# Patient Record
Sex: Female | Born: 1960 | Race: White | Hispanic: No | Smoking: Former smoker
Health system: Southern US, Community
[De-identification: ages and names within clinical notes are randomized; demographics above are authoritative.]

## PROBLEM LIST (undated history)

## (undated) DIAGNOSIS — R0600 Dyspnea, unspecified: Secondary | ICD-10-CM

## (undated) DIAGNOSIS — I272 Pulmonary hypertension, unspecified: Secondary | ICD-10-CM

## (undated) DIAGNOSIS — R519 Headache, unspecified: Secondary | ICD-10-CM

## (undated) DIAGNOSIS — K219 Gastro-esophageal reflux disease without esophagitis: Secondary | ICD-10-CM

## (undated) DIAGNOSIS — R011 Cardiac murmur, unspecified: Secondary | ICD-10-CM

## (undated) DIAGNOSIS — K589 Irritable bowel syndrome without diarrhea: Secondary | ICD-10-CM

## (undated) DIAGNOSIS — C50419 Malignant neoplasm of upper-outer quadrant of unspecified female breast: Secondary | ICD-10-CM

## (undated) DIAGNOSIS — N809 Endometriosis, unspecified: Secondary | ICD-10-CM

## (undated) DIAGNOSIS — Z923 Personal history of irradiation: Secondary | ICD-10-CM

## (undated) DIAGNOSIS — Z9221 Personal history of antineoplastic chemotherapy: Secondary | ICD-10-CM

## (undated) DIAGNOSIS — G4730 Sleep apnea, unspecified: Secondary | ICD-10-CM

## (undated) DIAGNOSIS — C50919 Malignant neoplasm of unspecified site of unspecified female breast: Secondary | ICD-10-CM

## (undated) DIAGNOSIS — J189 Pneumonia, unspecified organism: Secondary | ICD-10-CM

## (undated) DIAGNOSIS — F419 Anxiety disorder, unspecified: Secondary | ICD-10-CM

## (undated) DIAGNOSIS — J45909 Unspecified asthma, uncomplicated: Secondary | ICD-10-CM

## (undated) DIAGNOSIS — M1990 Unspecified osteoarthritis, unspecified site: Secondary | ICD-10-CM

## (undated) HISTORY — DX: Pulmonary hypertension, unspecified: I27.20

## (undated) HISTORY — DX: Irritable bowel syndrome, unspecified: K58.9

## (undated) HISTORY — PX: BREAST ENHANCEMENT SURGERY: SHX7

## (undated) HISTORY — DX: Anxiety disorder, unspecified: F41.9

## (undated) HISTORY — DX: Gastro-esophageal reflux disease without esophagitis: K21.9

## (undated) HISTORY — DX: Malignant neoplasm of upper-outer quadrant of unspecified female breast: C50.419

## (undated) HISTORY — DX: Malignant neoplasm of unspecified site of unspecified female breast: C50.919

## (undated) HISTORY — DX: Endometriosis, unspecified: N80.9

## (undated) HISTORY — DX: Sleep apnea, unspecified: G47.30

---
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
## 1995-01-11 HISTORY — PX: NISSEN FUNDOPLICATION: SHX2091

## 1995-01-11 HISTORY — PX: HERNIA REPAIR: SHX51

## 1999-01-11 DIAGNOSIS — N809 Endometriosis, unspecified: Secondary | ICD-10-CM

## 1999-01-11 HISTORY — PX: EXPLORATORY LAPAROTOMY: SUR591

## 1999-01-11 HISTORY — DX: Endometriosis, unspecified: N80.9

## 1999-01-11 HISTORY — PX: OVARY SURGERY: SHX727

## 2001-10-08 ENCOUNTER — Other Ambulatory Visit: Admission: RE | Admit: 2001-10-08 | Discharge: 2001-10-08 | Payer: Self-pay | Admitting: Obstetrics and Gynecology

## 2001-10-10 ENCOUNTER — Encounter: Payer: Self-pay | Admitting: Obstetrics and Gynecology

## 2001-10-10 ENCOUNTER — Ambulatory Visit (HOSPITAL_COMMUNITY): Admission: RE | Admit: 2001-10-10 | Discharge: 2001-10-10 | Payer: Self-pay | Admitting: Obstetrics and Gynecology

## 2001-10-26 ENCOUNTER — Encounter: Payer: Self-pay | Admitting: Obstetrics and Gynecology

## 2001-10-26 ENCOUNTER — Encounter: Admission: RE | Admit: 2001-10-26 | Discharge: 2001-10-26 | Payer: Self-pay | Admitting: Obstetrics and Gynecology

## 2002-03-01 ENCOUNTER — Ambulatory Visit (HOSPITAL_COMMUNITY): Admission: RE | Admit: 2002-03-01 | Discharge: 2002-03-01 | Payer: Self-pay | Admitting: Obstetrics and Gynecology

## 2002-03-01 ENCOUNTER — Encounter: Payer: Self-pay | Admitting: Obstetrics and Gynecology

## 2002-10-11 ENCOUNTER — Other Ambulatory Visit: Admission: RE | Admit: 2002-10-11 | Discharge: 2002-10-11 | Payer: Self-pay | Admitting: Obstetrics and Gynecology

## 2003-11-19 ENCOUNTER — Ambulatory Visit: Payer: Self-pay | Admitting: Unknown Physician Specialty

## 2003-12-10 ENCOUNTER — Encounter: Admission: RE | Admit: 2003-12-10 | Discharge: 2003-12-10 | Payer: Self-pay | Admitting: Unknown Physician Specialty

## 2005-01-10 DIAGNOSIS — G4730 Sleep apnea, unspecified: Secondary | ICD-10-CM

## 2005-01-10 HISTORY — DX: Sleep apnea, unspecified: G47.30

## 2005-02-02 ENCOUNTER — Ambulatory Visit: Payer: Self-pay

## 2005-03-02 ENCOUNTER — Ambulatory Visit: Payer: Self-pay | Admitting: Unknown Physician Specialty

## 2005-03-15 ENCOUNTER — Ambulatory Visit: Payer: Self-pay | Admitting: Unknown Physician Specialty

## 2005-09-15 ENCOUNTER — Ambulatory Visit: Payer: Self-pay | Admitting: Family Medicine

## 2005-09-21 ENCOUNTER — Ambulatory Visit: Payer: Self-pay | Admitting: Unknown Physician Specialty

## 2006-01-11 ENCOUNTER — Encounter: Payer: Self-pay | Admitting: General Practice

## 2006-02-10 ENCOUNTER — Encounter: Payer: Self-pay | Admitting: General Practice

## 2006-03-03 ENCOUNTER — Encounter: Admission: RE | Admit: 2006-03-03 | Discharge: 2006-03-03 | Payer: Self-pay | Admitting: Unknown Physician Specialty

## 2006-03-08 ENCOUNTER — Ambulatory Visit: Payer: Self-pay | Admitting: Unknown Physician Specialty

## 2006-03-24 ENCOUNTER — Ambulatory Visit: Payer: Self-pay | Admitting: Unknown Physician Specialty

## 2006-07-02 ENCOUNTER — Other Ambulatory Visit: Payer: Self-pay

## 2006-07-02 ENCOUNTER — Emergency Department: Payer: Self-pay | Admitting: Unknown Physician Specialty

## 2006-08-04 ENCOUNTER — Ambulatory Visit: Payer: Self-pay | Admitting: Family Medicine

## 2006-11-22 ENCOUNTER — Ambulatory Visit: Payer: Self-pay | Admitting: Physician Assistant

## 2007-03-13 ENCOUNTER — Ambulatory Visit: Payer: Self-pay | Admitting: Unknown Physician Specialty

## 2008-03-14 ENCOUNTER — Ambulatory Visit: Payer: Self-pay | Admitting: Unknown Physician Specialty

## 2008-04-02 ENCOUNTER — Ambulatory Visit: Payer: Self-pay | Admitting: Unknown Physician Specialty

## 2008-07-09 ENCOUNTER — Ambulatory Visit: Payer: Self-pay | Admitting: Unknown Physician Specialty

## 2008-07-22 ENCOUNTER — Ambulatory Visit: Payer: Self-pay | Admitting: Unknown Physician Specialty

## 2008-09-03 ENCOUNTER — Ambulatory Visit: Payer: Self-pay | Admitting: Rheumatology

## 2008-09-06 ENCOUNTER — Ambulatory Visit: Payer: Self-pay | Admitting: Rheumatology

## 2009-01-10 HISTORY — PX: DILATION AND CURETTAGE OF UTERUS: SHX78

## 2009-04-28 ENCOUNTER — Ambulatory Visit: Payer: Self-pay | Admitting: Unknown Physician Specialty

## 2010-01-10 HISTORY — PX: COLONOSCOPY: SHX174

## 2010-05-05 ENCOUNTER — Ambulatory Visit: Payer: Self-pay | Admitting: Unknown Physician Specialty

## 2010-05-18 ENCOUNTER — Ambulatory Visit: Payer: Self-pay | Admitting: Unknown Physician Specialty

## 2010-08-09 DIAGNOSIS — M9979 Connective tissue and disc stenosis of intervertebral foramina of abdomen and other regions: Secondary | ICD-10-CM | POA: Insufficient documentation

## 2010-08-09 DIAGNOSIS — F339 Major depressive disorder, recurrent, unspecified: Secondary | ICD-10-CM | POA: Insufficient documentation

## 2010-09-19 ENCOUNTER — Ambulatory Visit: Payer: Self-pay | Admitting: Surgery

## 2010-10-13 ENCOUNTER — Other Ambulatory Visit (HOSPITAL_COMMUNITY): Payer: Self-pay | Admitting: Gastroenterology

## 2010-11-04 ENCOUNTER — Encounter (HOSPITAL_COMMUNITY)
Admission: RE | Admit: 2010-11-04 | Discharge: 2010-11-04 | Disposition: A | Discharge status: Home / Self Care | Payer: Federal, State, Local not specified - PPO | Source: Ambulatory Visit | Attending: Gastroenterology | Admitting: Gastroenterology

## 2010-11-04 DIAGNOSIS — R109 Unspecified abdominal pain: Secondary | ICD-10-CM | POA: Insufficient documentation

## 2010-11-04 MED ORDER — SINCALIDE 5 MCG IJ SOLR: 0.0200 ug/kg | Freq: Once | INTRAMUSCULAR | Status: DC

## 2010-11-04 MED ORDER — TECHNETIUM TC 99M MEBROFENIN IV KIT
5.4000 | PACK | Freq: Once | INTRAVENOUS | Status: AC | PRN
  Administered 2010-11-04: 5 via INTRAVENOUS

## 2011-01-11 DIAGNOSIS — I272 Pulmonary hypertension, unspecified: Secondary | ICD-10-CM

## 2011-01-11 HISTORY — DX: Pulmonary hypertension, unspecified: I27.20

## 2011-01-11 HISTORY — PX: RIGHT HEART CATH: SHX6075

## 2011-01-11 HISTORY — PX: BREAST BIOPSY: SHX20

## 2011-05-31 ENCOUNTER — Ambulatory Visit: Payer: Self-pay | Admitting: Family Medicine

## 2011-06-03 ENCOUNTER — Ambulatory Visit: Payer: Self-pay | Admitting: Family Medicine

## 2011-06-22 ENCOUNTER — Ambulatory Visit: Payer: Self-pay | Admitting: Family Medicine

## 2011-11-29 ENCOUNTER — Ambulatory Visit: Payer: Self-pay | Admitting: Cardiology

## 2011-12-05 ENCOUNTER — Ambulatory Visit: Payer: Self-pay | Admitting: General Surgery

## 2011-12-16 ENCOUNTER — Other Ambulatory Visit (HOSPITAL_COMMUNITY): Payer: Self-pay | Admitting: Cardiovascular Disease

## 2011-12-16 DIAGNOSIS — R0602 Shortness of breath: Secondary | ICD-10-CM

## 2011-12-28 ENCOUNTER — Ambulatory Visit (HOSPITAL_COMMUNITY): Payer: Federal, State, Local not specified - PPO | Attending: Specialist

## 2011-12-28 DIAGNOSIS — R0602 Shortness of breath: Secondary | ICD-10-CM

## 2012-01-11 DIAGNOSIS — C50919 Malignant neoplasm of unspecified site of unspecified female breast: Secondary | ICD-10-CM

## 2012-01-11 HISTORY — PX: PORT A CATH REVISION: SHX6033

## 2012-01-11 HISTORY — DX: Malignant neoplasm of unspecified site of unspecified female breast: C50.919

## 2012-01-11 HISTORY — PX: BREAST BIOPSY: SHX20

## 2012-01-11 HISTORY — PX: REDUCTION MAMMAPLASTY: SUR839

## 2012-01-11 HISTORY — PX: BREAST LUMPECTOMY: SHX2

## 2012-01-25 ENCOUNTER — Ambulatory Visit: Payer: Self-pay | Admitting: General Surgery

## 2012-01-30 ENCOUNTER — Ambulatory Visit: Payer: Self-pay | Admitting: General Surgery

## 2012-01-30 DIAGNOSIS — C50419 Malignant neoplasm of upper-outer quadrant of unspecified female breast: Secondary | ICD-10-CM

## 2012-01-30 HISTORY — DX: Malignant neoplasm of upper-outer quadrant of unspecified female breast: C50.419

## 2012-01-30 HISTORY — PX: BREAST SURGERY: SHX581

## 2012-02-01 LAB — PATHOLOGY REPORT

## 2012-02-09 ENCOUNTER — Ambulatory Visit: Payer: Self-pay

## 2012-02-09 HISTORY — PX: BREAST SURGERY: SHX581

## 2012-02-29 ENCOUNTER — Ambulatory Visit: Payer: Self-pay | Admitting: Radiation Oncology

## 2012-02-29 ENCOUNTER — Ambulatory Visit: Payer: Self-pay | Admitting: Internal Medicine

## 2012-03-10 ENCOUNTER — Ambulatory Visit: Payer: Self-pay | Admitting: Radiation Oncology

## 2012-03-10 ENCOUNTER — Ambulatory Visit: Payer: Self-pay | Admitting: Internal Medicine

## 2012-03-13 ENCOUNTER — Ambulatory Visit: Payer: Self-pay | Admitting: General Surgery

## 2012-03-14 LAB — COMPREHENSIVE METABOLIC PANEL
Albumin: 3.8 g/dL (ref 3.4–5.0)
Alkaline Phosphatase: 115 U/L (ref 50–136)
Anion Gap: 7 (ref 7–16)
BUN: 17 mg/dL (ref 7–18)
Bilirubin,Total: 0.4 mg/dL (ref 0.2–1.0)
Calcium, Total: 8.8 mg/dL (ref 8.5–10.1)
Chloride: 106 mmol/L (ref 98–107)
Co2: 26 mmol/L (ref 21–32)
Creatinine: 0.63 mg/dL (ref 0.60–1.30)
Glucose: 92 mg/dL (ref 65–99)
Osmolality: 279 (ref 275–301)
Potassium: 4 mmol/L (ref 3.5–5.1)
SGOT(AST): 30 U/L (ref 15–37)
SGPT (ALT): 36 U/L (ref 12–78)
Sodium: 139 mmol/L (ref 136–145)
Total Protein: 6.9 g/dL (ref 6.4–8.2)

## 2012-03-14 LAB — CBC CANCER CENTER
Basophil #: 0 x10 3/mm (ref 0.0–0.1)
Basophil %: 0.4 %
Eosinophil #: 0.1 x10 3/mm (ref 0.0–0.7)
Eosinophil %: 1.4 %
HCT: 38.4 % (ref 35.0–47.0)
HGB: 12.8 g/dL (ref 12.0–16.0)
Lymphocyte #: 2.5 x10 3/mm (ref 1.0–3.6)
Lymphocyte %: 40.2 %
MCH: 30.8 pg (ref 26.0–34.0)
MCHC: 33.3 g/dL (ref 32.0–36.0)
MCV: 92 fL (ref 80–100)
Monocyte #: 0.5 x10 3/mm (ref 0.2–0.9)
Monocyte %: 7.3 %
Neutrophil #: 3.2 x10 3/mm (ref 1.4–6.5)
Neutrophil %: 50.7 %
Platelet: 290 x10 3/mm (ref 150–440)
RBC: 4.15 10*6/uL (ref 3.80–5.20)
RDW: 12.3 % (ref 11.5–14.5)
WBC: 6.3 x10 3/mm (ref 3.6–11.0)

## 2012-03-15 LAB — CANCER ANTIGEN 27.29: CA 27.29: 7.4 U/mL (ref 0.0–38.6)

## 2012-03-21 LAB — CBC CANCER CENTER
Basophil #: 0 x10 3/mm (ref 0.0–0.1)
Basophil %: 2.1 %
Eosinophil #: 0.1 x10 3/mm (ref 0.0–0.7)
Eosinophil %: 11.6 %
HCT: 38 % (ref 35.0–47.0)
HGB: 12.5 g/dL (ref 12.0–16.0)
Lymphocyte #: 0.9 x10 3/mm — ABNORMAL LOW (ref 1.0–3.6)
Lymphocyte %: 76 %
MCH: 30.3 pg (ref 26.0–34.0)
MCHC: 33 g/dL (ref 32.0–36.0)
MCV: 92 fL (ref 80–100)
Monocyte #: 0.1 x10 3/mm — ABNORMAL LOW (ref 0.2–0.9)
Monocyte %: 7.2 %
Neutrophil #: 0 x10 3/mm — ABNORMAL LOW (ref 1.4–6.5)
Neutrophil %: 3.1 %
Platelet: 178 x10 3/mm (ref 150–440)
RBC: 4.13 10*6/uL (ref 3.80–5.20)
RDW: 12.2 % (ref 11.5–14.5)
WBC: 1.2 x10 3/mm — CL (ref 3.6–11.0)

## 2012-03-28 LAB — BASIC METABOLIC PANEL
Anion Gap: 7 (ref 7–16)
BUN: 19 mg/dL — ABNORMAL HIGH (ref 7–18)
Calcium, Total: 8.9 mg/dL (ref 8.5–10.1)
Chloride: 104 mmol/L (ref 98–107)
Co2: 28 mmol/L (ref 21–32)
Creatinine: 0.87 mg/dL (ref 0.60–1.30)
EGFR (African American): >60
EGFR (Non-African Amer.): >60
Glucose: 96 mg/dL (ref 65–99)
Osmolality: 280 (ref 275–301)
Potassium: 4.3 mmol/L (ref 3.5–5.1)
Sodium: 139 mmol/L (ref 136–145)

## 2012-03-28 LAB — CBC CANCER CENTER
Basophil #: 0 x10 3/mm (ref 0.0–0.1)
Basophil %: 0.4 %
Eosinophil #: 0.1 x10 3/mm (ref 0.0–0.7)
Eosinophil %: 0.6 %
HCT: 38.9 % (ref 35.0–47.0)
HGB: 13.1 g/dL (ref 12.0–16.0)
Lymphocyte #: 2.3 x10 3/mm (ref 1.0–3.6)
Lymphocyte %: 22 %
MCH: 31.1 pg (ref 26.0–34.0)
MCHC: 33.8 g/dL (ref 32.0–36.0)
MCV: 92 fL (ref 80–100)
Monocyte #: 0.9 x10 3/mm (ref 0.2–0.9)
Monocyte %: 8.4 %
Neutrophil #: 7.3 x10 3/mm — ABNORMAL HIGH (ref 1.4–6.5)
Neutrophil %: 68.6 %
Platelet: 209 x10 3/mm (ref 150–440)
RBC: 4.23 10*6/uL (ref 3.80–5.20)
RDW: 12.4 % (ref 11.5–14.5)
WBC: 10.6 x10 3/mm (ref 3.6–11.0)

## 2012-03-28 LAB — HEPATIC FUNCTION PANEL A (ARMC)
Albumin: 4.1 g/dL (ref 3.4–5.0)
Alkaline Phosphatase: 133 U/L (ref 50–136)
Bilirubin, Direct: <0.05 mg/dL (ref 0.00–0.20)
Bilirubin,Total: 0.2 mg/dL (ref 0.2–1.0)
SGOT(AST): 18 U/L (ref 15–37)
SGPT (ALT): 38 U/L (ref 12–78)
Total Protein: 7.1 g/dL (ref 6.4–8.2)

## 2012-04-04 LAB — CBC CANCER CENTER
Basophil #: 0 x10 3/mm (ref 0.0–0.1)
Basophil %: 1.1 %
Eosinophil #: 0 x10 3/mm (ref 0.0–0.7)
Eosinophil %: 0.2 %
HCT: 36.9 % (ref 35.0–47.0)
HGB: 12.3 g/dL (ref 12.0–16.0)
Lymphocyte #: 1.7 x10 3/mm (ref 1.0–3.6)
Lymphocyte %: 36.1 %
MCH: 30.7 pg (ref 26.0–34.0)
MCHC: 33.3 g/dL (ref 32.0–36.0)
MCV: 92 fL (ref 80–100)
Monocyte #: 0.5 x10 3/mm (ref 0.2–0.9)
Monocyte %: 11.2 %
Neutrophil #: 2.4 x10 3/mm (ref 1.4–6.5)
Neutrophil %: 51.4 %
Platelet: 358 x10 3/mm (ref 150–440)
RBC: 4 10*6/uL (ref 3.80–5.20)
RDW: 12.4 % (ref 11.5–14.5)
WBC: 4.6 x10 3/mm (ref 3.6–11.0)

## 2012-04-10 ENCOUNTER — Ambulatory Visit: Payer: Self-pay | Admitting: Radiation Oncology

## 2012-04-10 ENCOUNTER — Ambulatory Visit: Payer: Self-pay | Admitting: Internal Medicine

## 2012-04-11 LAB — CBC CANCER CENTER
Basophil #: 0 x10 3/mm (ref 0.0–0.1)
Basophil %: 3.2 %
Eosinophil #: 0 x10 3/mm (ref 0.0–0.7)
Eosinophil %: 0.8 %
HCT: 32.1 % — ABNORMAL LOW (ref 35.0–47.0)
HGB: 10.6 g/dL — ABNORMAL LOW (ref 12.0–16.0)
Lymphocyte #: 0.8 x10 3/mm — ABNORMAL LOW (ref 1.0–3.6)
Lymphocyte %: 63.4 %
MCH: 30.3 pg (ref 26.0–34.0)
MCHC: 33.1 g/dL (ref 32.0–36.0)
MCV: 91 fL (ref 80–100)
Monocyte #: 0.1 x10 3/mm — ABNORMAL LOW (ref 0.2–0.9)
Monocyte %: 8 %
Neutrophil #: 0.3 x10 3/mm — ABNORMAL LOW (ref 1.4–6.5)
Neutrophil %: 24.6 %
Platelet: 225 x10 3/mm (ref 150–440)
RBC: 3.51 10*6/uL — ABNORMAL LOW (ref 3.80–5.20)
RDW: 12.6 % (ref 11.5–14.5)
WBC: 1.3 x10 3/mm — CL (ref 3.6–11.0)

## 2012-04-18 LAB — HEPATIC FUNCTION PANEL A (ARMC)
Albumin: 3.7 g/dL (ref 3.4–5.0)
Alkaline Phosphatase: 142 U/L — ABNORMAL HIGH (ref 50–136)
Bilirubin, Direct: <0.05 mg/dL (ref 0.00–0.20)
Bilirubin,Total: 0.2 mg/dL (ref 0.2–1.0)
SGOT(AST): 17 U/L (ref 15–37)
SGPT (ALT): 41 U/L (ref 12–78)
Total Protein: 6.7 g/dL (ref 6.4–8.2)

## 2012-04-18 LAB — CBC CANCER CENTER
Basophil #: 0.1 x10 3/mm (ref 0.0–0.1)
Basophil %: 0.8 %
Eosinophil #: 0 x10 3/mm (ref 0.0–0.7)
Eosinophil %: 0.2 %
HCT: 33.3 % — ABNORMAL LOW (ref 35.0–47.0)
HGB: 10.8 g/dL — ABNORMAL LOW (ref 12.0–16.0)
Lymphocyte #: 2.2 x10 3/mm (ref 1.0–3.6)
Lymphocyte %: 19.2 %
MCH: 29.8 pg (ref 26.0–34.0)
MCHC: 32.4 g/dL (ref 32.0–36.0)
MCV: 92 fL (ref 80–100)
Monocyte #: 0.8 x10 3/mm (ref 0.2–0.9)
Monocyte %: 7.1 %
Neutrophil #: 8.3 x10 3/mm — ABNORMAL HIGH (ref 1.4–6.5)
Neutrophil %: 72.7 %
Platelet: 176 x10 3/mm (ref 150–440)
RBC: 3.63 10*6/uL — ABNORMAL LOW (ref 3.80–5.20)
RDW: 12.8 % (ref 11.5–14.5)
WBC: 11.5 x10 3/mm — ABNORMAL HIGH (ref 3.6–11.0)

## 2012-04-18 LAB — BASIC METABOLIC PANEL
Anion Gap: 7 (ref 7–16)
BUN: 20 mg/dL — ABNORMAL HIGH (ref 7–18)
Calcium, Total: 9 mg/dL (ref 8.5–10.1)
Chloride: 107 mmol/L (ref 98–107)
Co2: 28 mmol/L (ref 21–32)
Creatinine: 0.88 mg/dL (ref 0.60–1.30)
EGFR (African American): >60
EGFR (Non-African Amer.): >60
Glucose: 102 mg/dL — ABNORMAL HIGH (ref 65–99)
Osmolality: 286 (ref 275–301)
Potassium: 4.3 mmol/L (ref 3.5–5.1)
Sodium: 142 mmol/L (ref 136–145)

## 2012-04-25 LAB — CBC CANCER CENTER
Basophil #: 0 x10 3/mm (ref 0.0–0.1)
Basophil %: 0.8 %
Eosinophil #: 0 x10 3/mm (ref 0.0–0.7)
Eosinophil %: 0.5 %
HCT: 32.6 % — ABNORMAL LOW (ref 35.0–47.0)
HGB: 11.1 g/dL — ABNORMAL LOW (ref 12.0–16.0)
Lymphocyte #: 1.2 x10 3/mm (ref 1.0–3.6)
Lymphocyte %: 26.7 %
MCH: 31 pg (ref 26.0–34.0)
MCHC: 34.1 g/dL (ref 32.0–36.0)
MCV: 91 fL (ref 80–100)
Monocyte #: 0.6 x10 3/mm (ref 0.2–0.9)
Monocyte %: 13.5 %
Neutrophil #: 2.7 x10 3/mm (ref 1.4–6.5)
Neutrophil %: 58.5 %
Platelet: 340 x10 3/mm (ref 150–440)
RBC: 3.58 10*6/uL — ABNORMAL LOW (ref 3.80–5.20)
RDW: 13.4 % (ref 11.5–14.5)
WBC: 4.5 x10 3/mm (ref 3.6–11.0)

## 2012-05-02 LAB — CBC CANCER CENTER
Basophil #: 0 x10 3/mm (ref 0.0–0.1)
Basophil %: 1.4 %
Eosinophil #: 0 x10 3/mm (ref 0.0–0.7)
Eosinophil %: 0.4 %
HCT: 31.7 % — ABNORMAL LOW (ref 35.0–47.0)
HGB: 10.6 g/dL — ABNORMAL LOW (ref 12.0–16.0)
Lymphocyte #: 0.6 x10 3/mm — ABNORMAL LOW (ref 1.0–3.6)
Lymphocyte %: 26.2 %
MCH: 30.8 pg (ref 26.0–34.0)
MCHC: 33.4 g/dL (ref 32.0–36.0)
MCV: 92 fL (ref 80–100)
Monocyte #: 0.1 x10 3/mm — ABNORMAL LOW (ref 0.2–0.9)
Monocyte %: 5.9 %
Neutrophil #: 1.4 x10 3/mm (ref 1.4–6.5)
Neutrophil %: 66.1 %
Platelet: 174 x10 3/mm (ref 150–440)
RBC: 3.43 10*6/uL — ABNORMAL LOW (ref 3.80–5.20)
RDW: 14.2 % (ref 11.5–14.5)
WBC: 2.1 x10 3/mm — ABNORMAL LOW (ref 3.6–11.0)

## 2012-05-09 LAB — CBC CANCER CENTER
Basophil #: 0 x10 3/mm (ref 0.0–0.1)
Basophil %: 0.5 %
Eosinophil #: 0 x10 3/mm (ref 0.0–0.7)
Eosinophil %: 0.4 %
HCT: 30.8 % — ABNORMAL LOW (ref 35.0–47.0)
HGB: 10.6 g/dL — ABNORMAL LOW (ref 12.0–16.0)
Lymphocyte #: 1.4 x10 3/mm (ref 1.0–3.6)
Lymphocyte %: 14.4 %
MCH: 31.6 pg (ref 26.0–34.0)
MCHC: 34.4 g/dL (ref 32.0–36.0)
MCV: 92 fL (ref 80–100)
Monocyte #: 0.9 x10 3/mm (ref 0.2–0.9)
Monocyte %: 9 %
Neutrophil #: 7.2 x10 3/mm — ABNORMAL HIGH (ref 1.4–6.5)
Neutrophil %: 75.7 %
Platelet: 270 x10 3/mm (ref 150–440)
RBC: 3.36 10*6/uL — ABNORMAL LOW (ref 3.80–5.20)
RDW: 15 % — ABNORMAL HIGH (ref 11.5–14.5)
WBC: 9.5 x10 3/mm (ref 3.6–11.0)

## 2012-05-09 LAB — BASIC METABOLIC PANEL
Anion Gap: 6 — ABNORMAL LOW (ref 7–16)
BUN: 14 mg/dL (ref 7–18)
Calcium, Total: 8.8 mg/dL (ref 8.5–10.1)
Chloride: 109 mmol/L — ABNORMAL HIGH (ref 98–107)
Co2: 26 mmol/L (ref 21–32)
Creatinine: 0.57 mg/dL — ABNORMAL LOW (ref 0.60–1.30)
EGFR (African American): >60
EGFR (Non-African Amer.): >60
Glucose: 91 mg/dL (ref 65–99)
Osmolality: 281 (ref 275–301)
Potassium: 3.8 mmol/L (ref 3.5–5.1)
Sodium: 141 mmol/L (ref 136–145)

## 2012-05-09 LAB — HEPATIC FUNCTION PANEL A (ARMC)
Albumin: 4 g/dL (ref 3.4–5.0)
Alkaline Phosphatase: 135 U/L (ref 50–136)
Bilirubin, Direct: <0.1 mg/dL (ref 0.00–0.20)
Bilirubin,Total: 0.3 mg/dL (ref 0.2–1.0)
SGOT(AST): 22 U/L (ref 15–37)
SGPT (ALT): 34 U/L (ref 12–78)
Total Protein: 6.7 g/dL (ref 6.4–8.2)

## 2012-05-10 ENCOUNTER — Ambulatory Visit: Payer: Self-pay | Admitting: Internal Medicine

## 2012-05-10 ENCOUNTER — Ambulatory Visit: Payer: Self-pay | Admitting: Radiation Oncology

## 2012-05-16 LAB — CBC CANCER CENTER
Basophil #: 0 x10 3/mm (ref 0.0–0.1)
Basophil %: 1 %
Eosinophil #: 0 x10 3/mm (ref 0.0–0.7)
Eosinophil %: 0.5 %
HCT: 34.4 % — ABNORMAL LOW (ref 35.0–47.0)
HGB: 11.8 g/dL — ABNORMAL LOW (ref 12.0–16.0)
Lymphocyte #: 1.1 x10 3/mm (ref 1.0–3.6)
Lymphocyte %: 27.7 %
MCH: 31.6 pg (ref 26.0–34.0)
MCHC: 34.2 g/dL (ref 32.0–36.0)
MCV: 93 fL (ref 80–100)
Monocyte #: 0.6 x10 3/mm (ref 0.2–0.9)
Monocyte %: 15.8 %
Neutrophil #: 2.1 x10 3/mm (ref 1.4–6.5)
Neutrophil %: 55 %
Platelet: 350 x10 3/mm (ref 150–440)
RBC: 3.72 10*6/uL — ABNORMAL LOW (ref 3.80–5.20)
RDW: 15.9 % — ABNORMAL HIGH (ref 11.5–14.5)
WBC: 3.8 x10 3/mm (ref 3.6–11.0)

## 2012-05-23 LAB — BASIC METABOLIC PANEL
Anion Gap: 9 (ref 7–16)
BUN: 20 mg/dL — ABNORMAL HIGH (ref 7–18)
Calcium, Total: 9.1 mg/dL (ref 8.5–10.1)
Chloride: 105 mmol/L (ref 98–107)
Co2: 26 mmol/L (ref 21–32)
Creatinine: 0.7 mg/dL (ref 0.60–1.30)
EGFR (African American): >60
EGFR (Non-African Amer.): >60
Glucose: 94 mg/dL (ref 65–99)
Osmolality: 282 (ref 275–301)
Potassium: 4.1 mmol/L (ref 3.5–5.1)
Sodium: 140 mmol/L (ref 136–145)

## 2012-05-23 LAB — CBC CANCER CENTER
Basophil #: 0 x10 3/mm (ref 0.0–0.1)
Basophil %: 1.3 %
Eosinophil #: 0 x10 3/mm (ref 0.0–0.7)
Eosinophil %: 0.4 %
HCT: 33.5 % — ABNORMAL LOW (ref 35.0–47.0)
HGB: 11.3 g/dL — ABNORMAL LOW (ref 12.0–16.0)
Lymphocyte #: 1.3 x10 3/mm (ref 1.0–3.6)
Lymphocyte %: 36.3 %
MCH: 31.7 pg (ref 26.0–34.0)
MCHC: 33.8 g/dL (ref 32.0–36.0)
MCV: 94 fL (ref 80–100)
Monocyte #: 0.5 x10 3/mm (ref 0.2–0.9)
Monocyte %: 13.4 %
Neutrophil #: 1.7 x10 3/mm (ref 1.4–6.5)
Neutrophil %: 48.6 %
Platelet: 267 x10 3/mm (ref 150–440)
RBC: 3.57 10*6/uL — ABNORMAL LOW (ref 3.80–5.20)
RDW: 15.7 % — ABNORMAL HIGH (ref 11.5–14.5)
WBC: 3.6 x10 3/mm (ref 3.6–11.0)

## 2012-05-25 ENCOUNTER — Ambulatory Visit: Payer: Self-pay | Admitting: Cardiovascular Disease

## 2012-05-29 ENCOUNTER — Ambulatory Visit: Payer: Self-pay | Admitting: Cardiology

## 2012-05-30 LAB — CBC CANCER CENTER
Basophil #: 0.1 x10 3/mm (ref 0.0–0.1)
Basophil %: 0.9 %
Eosinophil #: 0.1 x10 3/mm (ref 0.0–0.7)
Eosinophil %: 1.2 %
HCT: 32.9 % — ABNORMAL LOW (ref 35.0–47.0)
HGB: 11.1 g/dL — ABNORMAL LOW (ref 12.0–16.0)
Lymphocyte #: 1.6 x10 3/mm (ref 1.0–3.6)
Lymphocyte %: 28 %
MCH: 31.9 pg (ref 26.0–34.0)
MCHC: 33.9 g/dL (ref 32.0–36.0)
MCV: 94 fL (ref 80–100)
Monocyte #: 0.7 x10 3/mm (ref 0.2–0.9)
Monocyte %: 12 %
Neutrophil #: 3.2 x10 3/mm (ref 1.4–6.5)
Neutrophil %: 57.9 %
Platelet: 267 x10 3/mm (ref 150–440)
RBC: 3.49 10*6/uL — ABNORMAL LOW (ref 3.80–5.20)
RDW: 15.9 % — ABNORMAL HIGH (ref 11.5–14.5)
WBC: 5.6 x10 3/mm (ref 3.6–11.0)

## 2012-06-06 LAB — CBC CANCER CENTER
Basophil #: 0 x10 3/mm (ref 0.0–0.1)
Basophil %: 1.5 %
Eosinophil #: 0.1 x10 3/mm (ref 0.0–0.7)
Eosinophil %: 9.1 %
HCT: 31.8 % — ABNORMAL LOW (ref 35.0–47.0)
HGB: 10.5 g/dL — ABNORMAL LOW (ref 12.0–16.0)
Lymphocyte #: 0.6 x10 3/mm — ABNORMAL LOW (ref 1.0–3.6)
Lymphocyte %: 64.4 %
MCH: 31.5 pg (ref 26.0–34.0)
MCHC: 33 g/dL (ref 32.0–36.0)
MCV: 96 fL (ref 80–100)
Monocyte #: 0.1 x10 3/mm — ABNORMAL LOW (ref 0.2–0.9)
Monocyte %: 6.2 %
Neutrophil #: 0.2 x10 3/mm — ABNORMAL LOW (ref 1.4–6.5)
Neutrophil %: 18.8 %
Platelet: 119 x10 3/mm — ABNORMAL LOW (ref 150–440)
RBC: 3.33 10*6/uL — ABNORMAL LOW (ref 3.80–5.20)
RDW: 14.7 % — ABNORMAL HIGH (ref 11.5–14.5)
WBC: 0.9 x10 3/mm — CL (ref 3.6–11.0)

## 2012-06-10 ENCOUNTER — Ambulatory Visit: Payer: Self-pay | Admitting: Internal Medicine

## 2012-06-10 ENCOUNTER — Ambulatory Visit: Payer: Self-pay | Admitting: Radiation Oncology

## 2012-06-13 ENCOUNTER — Ambulatory Visit: Payer: Self-pay | Admitting: General Surgery

## 2012-06-13 LAB — CBC CANCER CENTER
Basophil #: 0 x10 3/mm (ref 0.0–0.1)
Basophil %: 0.7 %
Eosinophil #: 0.1 x10 3/mm (ref 0.0–0.7)
Eosinophil %: 2.6 %
HCT: 30.8 % — ABNORMAL LOW (ref 35.0–47.0)
HGB: 10.7 g/dL — ABNORMAL LOW (ref 12.0–16.0)
Lymphocyte #: 1.3 x10 3/mm (ref 1.0–3.6)
Lymphocyte %: 22.7 %
MCH: 32.7 pg (ref 26.0–34.0)
MCHC: 34.6 g/dL (ref 32.0–36.0)
MCV: 95 fL (ref 80–100)
Monocyte #: 0.6 x10 3/mm (ref 0.2–0.9)
Monocyte %: 11 %
Neutrophil #: 3.6 x10 3/mm (ref 1.4–6.5)
Neutrophil %: 63 %
Platelet: 347 x10 3/mm (ref 150–440)
RBC: 3.26 10*6/uL — ABNORMAL LOW (ref 3.80–5.20)
RDW: 15.1 % — ABNORMAL HIGH (ref 11.5–14.5)
WBC: 5.7 x10 3/mm (ref 3.6–11.0)

## 2012-06-13 LAB — HEPATIC FUNCTION PANEL A (ARMC)
Albumin: 3.8 g/dL (ref 3.4–5.0)
Alkaline Phosphatase: 145 U/L — ABNORMAL HIGH (ref 50–136)
Bilirubin, Direct: <0.05 mg/dL (ref 0.00–0.20)
Bilirubin,Total: 0.2 mg/dL (ref 0.2–1.0)
SGOT(AST): 33 U/L (ref 15–37)
SGPT (ALT): 38 U/L (ref 12–78)
Total Protein: 6.8 g/dL (ref 6.4–8.2)

## 2012-06-13 LAB — BASIC METABOLIC PANEL
Anion Gap: 4 — ABNORMAL LOW (ref 7–16)
BUN: 11 mg/dL (ref 7–18)
Calcium, Total: 9.1 mg/dL (ref 8.5–10.1)
Chloride: 109 mmol/L — ABNORMAL HIGH (ref 98–107)
Co2: 27 mmol/L (ref 21–32)
Creatinine: 0.48 mg/dL — ABNORMAL LOW (ref 0.60–1.30)
EGFR (African American): >60
EGFR (Non-African Amer.): >60
Glucose: 90 mg/dL (ref 65–99)
Osmolality: 278 (ref 275–301)
Potassium: 3.8 mmol/L (ref 3.5–5.1)
Sodium: 140 mmol/L (ref 136–145)

## 2012-06-19 ENCOUNTER — Ambulatory Visit: Payer: Self-pay | Admitting: General Surgery

## 2012-06-19 ENCOUNTER — Encounter: Payer: Self-pay | Admitting: General Surgery

## 2012-06-20 LAB — CBC CANCER CENTER
Basophil #: 0 x10 3/mm (ref 0.0–0.1)
Basophil %: 0.2 %
Eosinophil #: 0 x10 3/mm (ref 0.0–0.7)
Eosinophil %: 0 %
HCT: 35.3 % (ref 35.0–47.0)
HGB: 12.4 g/dL (ref 12.0–16.0)
Lymphocyte #: 0.4 x10 3/mm — ABNORMAL LOW (ref 1.0–3.6)
Lymphocyte %: 11.9 %
MCH: 33.5 pg (ref 26.0–34.0)
MCHC: 35.1 g/dL (ref 32.0–36.0)
MCV: 96 fL (ref 80–100)
Monocyte #: 0 x10 3/mm — ABNORMAL LOW (ref 0.2–0.9)
Monocyte %: 1.2 %
Neutrophil #: 3.2 x10 3/mm (ref 1.4–6.5)
Neutrophil %: 86.7 %
Platelet: 403 x10 3/mm (ref 150–440)
RBC: 3.69 10*6/uL — ABNORMAL LOW (ref 3.80–5.20)
RDW: 15 % — ABNORMAL HIGH (ref 11.5–14.5)
WBC: 3.7 x10 3/mm (ref 3.6–11.0)

## 2012-06-27 ENCOUNTER — Encounter: Payer: Self-pay | Admitting: General Surgery

## 2012-06-27 ENCOUNTER — Ambulatory Visit (INDEPENDENT_AMBULATORY_CARE_PROVIDER_SITE_OTHER): Payer: Federal, State, Local not specified - PPO | Admitting: General Surgery

## 2012-06-27 VITALS — BP 110/78 | HR 78 | Resp 12 | Ht 62.0 in | Wt 191.0 lb

## 2012-06-27 DIAGNOSIS — C50912 Malignant neoplasm of unspecified site of left female breast: Secondary | ICD-10-CM

## 2012-06-27 DIAGNOSIS — C50919 Malignant neoplasm of unspecified site of unspecified female breast: Secondary | ICD-10-CM

## 2012-06-27 DIAGNOSIS — Z853 Personal history of malignant neoplasm of breast: Secondary | ICD-10-CM

## 2012-06-27 LAB — CBC CANCER CENTER
Basophil #: 0 x10 3/mm (ref 0.0–0.1)
Basophil %: 0.8 %
Eosinophil #: 0 x10 3/mm (ref 0.0–0.7)
Eosinophil %: 0 %
HCT: 32.8 % — ABNORMAL LOW (ref 35.0–47.0)
HGB: 11.6 g/dL — ABNORMAL LOW (ref 12.0–16.0)
Lymphocyte #: 0.5 x10 3/mm — ABNORMAL LOW (ref 1.0–3.6)
Lymphocyte %: 12.5 %
MCH: 33.5 pg (ref 26.0–34.0)
MCHC: 35.2 g/dL (ref 32.0–36.0)
MCV: 95 fL (ref 80–100)
Monocyte #: 0.1 x10 3/mm — ABNORMAL LOW (ref 0.2–0.9)
Monocyte %: 2 %
Neutrophil #: 3.5 x10 3/mm (ref 1.4–6.5)
Neutrophil %: 84.7 %
Platelet: 295 x10 3/mm (ref 150–440)
RBC: 3.45 10*6/uL — ABNORMAL LOW (ref 3.80–5.20)
RDW: 14.4 % (ref 11.5–14.5)
WBC: 4.1 x10 3/mm (ref 3.6–11.0)

## 2012-06-27 NOTE — Patient Instructions (Signed)
Patient to return in for December 2014

## 2012-06-27 NOTE — Progress Notes (Signed)
Patient ID: Katrina Holland, female   DOB: 1960/11/19, 52 y.o.   MRN: [id number]  No chief complaint on file.   HPI Katrina Holland is a 52 y.o. female Who presents for a breast evaluation. The most recent mammogram was done on 06/19/12 cat 2 Patient does perform regular self breast checks and gets regular mammograms done. No family history of breast cancer. Patient had an left breast lumpectomy in 2014.    HPI  Past Medical History  Diagnosis Date  . Breast mass   . Anxiety   . GERD (gastroesophageal reflux disease)   . Pulmonary hypertension 2013  . Irritable bowel syndrome   . Endometriosis 2001  . Sleep apnea 2007  . Cancer 2014    breast     Past Surgical History  Procedure Laterality Date  . Breast surgery Left 2014  . Breast enhancement surgery  2014  . Port a cath revision  2014  . Dilation and curettage of uterus  2011  . Ovary surgery Right 2001  . Nissen fundoplication  1997  . Tonsillectomy  1969  . Hernia repair  1997  . Colonoscopy  2012  . Right heart cath  2013  . Exploratory laparotomy  2001    Family History  Problem Relation Age of Onset  . Colon cancer Paternal Grandfather     Social History History  Substance Use Topics  . Smoking status: Former Smoker -- 5.00 packs/day for 5 years  . Smokeless tobacco: Never Used  . Alcohol Use: No    No Known Allergies  Current Outpatient Prescriptions  Medication Sig Dispense Refill  . diclofenac (VOLTAREN) 75 MG EC tablet Take 1 tablet by mouth 2 (two) times daily.      . sertraline (ZOLOFT) 100 MG tablet Take 100 mg by mouth daily.       No current facility-administered medications for this visit.    Review of Systems Review of Systems  Constitutional: Negative.   Respiratory: Negative.   Cardiovascular: Negative.     Blood pressure 110/78, pulse 78, resp. rate 12, height 5\' 2"  (1.575 m), weight 191 lb (86.637 kg).  Physical Exam Physical Exam  Constitutional: She appears well-developed and  well-nourished.  Cardiovascular: Normal rate, regular rhythm and normal heart sounds.   Pulmonary/Chest: Breath sounds normal. Right breast exhibits no inverted nipple, no mass, no nipple discharge, no skin change and no tenderness. Left breast exhibits no inverted nipple, no mass, no nipple discharge, no skin change and no tenderness.  Left breast well heal upper quadrant incision and well healed incision at 6 o'clock Power port is doing well.  Lymphadenopathy:    She has no cervical adenopathy.  Neurological: She is alert.  Skin: Skin is warm and dry.    Data Reviewed Bilateral mammograms dated 06/13/2012 was reviewed. Previously identified left breast mass is no longer appreciated. BI-RAD-2.  Assessment    Stage I, T1c carcinoma of the left breast.    Plan    The patient has just completed her a.AC therapy and is beginning Taxol. She will be continuing on Herceptin for one year. Whole breast radiation will be initiated after the Taxol therapy is completed. We'll plan for followup examination with a left breast mammogram in 6 months.       Earline Mayotte 06/28/2012, 5:56 PM

## 2012-06-28 ENCOUNTER — Encounter: Payer: Self-pay | Admitting: General Surgery

## 2012-06-28 DIAGNOSIS — C50919 Malignant neoplasm of unspecified site of unspecified female breast: Secondary | ICD-10-CM | POA: Insufficient documentation

## 2012-07-04 LAB — CBC CANCER CENTER
Basophil #: 0 x10 3/mm (ref 0.0–0.1)
Basophil %: 1.1 %
Eosinophil #: 0 x10 3/mm (ref 0.0–0.7)
Eosinophil %: 0.4 %
HCT: 30.8 % — ABNORMAL LOW (ref 35.0–47.0)
HGB: 10.9 g/dL — ABNORMAL LOW (ref 12.0–16.0)
Lymphocyte #: 1.2 x10 3/mm (ref 1.0–3.6)
Lymphocyte %: 29.2 %
MCH: 34 pg (ref 26.0–34.0)
MCHC: 35.4 g/dL (ref 32.0–36.0)
MCV: 96 fL (ref 80–100)
Monocyte #: 0.4 x10 3/mm (ref 0.2–0.9)
Monocyte %: 11.2 %
Neutrophil #: 2.3 x10 3/mm (ref 1.4–6.5)
Neutrophil %: 58.1 %
Platelet: 254 x10 3/mm (ref 150–440)
RBC: 3.21 10*6/uL — ABNORMAL LOW (ref 3.80–5.20)
RDW: 13.9 % (ref 11.5–14.5)
WBC: 4 x10 3/mm (ref 3.6–11.0)

## 2012-07-04 LAB — HEPATIC FUNCTION PANEL A (ARMC)
Albumin: 3.5 g/dL (ref 3.4–5.0)
Alkaline Phosphatase: 118 U/L (ref 50–136)
Bilirubin, Direct: 0.1 mg/dL (ref 0.00–0.20)
Bilirubin,Total: 0.3 mg/dL (ref 0.2–1.0)
SGOT(AST): 20 U/L (ref 15–37)
SGPT (ALT): 43 U/L (ref 12–78)
Total Protein: 6.6 g/dL (ref 6.4–8.2)

## 2012-07-04 LAB — CREATININE, SERUM
Creatinine: 0.81 mg/dL (ref 0.60–1.30)
EGFR (African American): >60
EGFR (Non-African Amer.): >60

## 2012-07-10 ENCOUNTER — Ambulatory Visit: Payer: Self-pay | Admitting: Internal Medicine

## 2012-07-10 ENCOUNTER — Ambulatory Visit: Payer: Self-pay | Admitting: Radiation Oncology

## 2012-07-18 LAB — CBC CANCER CENTER
Basophil #: 0 x10 3/mm (ref 0.0–0.1)
Basophil %: 0.9 %
Eosinophil #: 0.1 x10 3/mm (ref 0.0–0.7)
Eosinophil %: 1.8 %
HCT: 30.9 % — ABNORMAL LOW (ref 35.0–47.0)
HGB: 11 g/dL — ABNORMAL LOW (ref 12.0–16.0)
Lymphocyte #: 1.2 x10 3/mm (ref 1.0–3.6)
Lymphocyte %: 26.7 %
MCH: 34.3 pg — ABNORMAL HIGH (ref 26.0–34.0)
MCHC: 35.8 g/dL (ref 32.0–36.0)
MCV: 96 fL (ref 80–100)
Monocyte #: 0.4 x10 3/mm (ref 0.2–0.9)
Monocyte %: 8.4 %
Neutrophil #: 2.8 x10 3/mm (ref 1.4–6.5)
Neutrophil %: 62.2 %
Platelet: 291 x10 3/mm (ref 150–440)
RBC: 3.22 10*6/uL — ABNORMAL LOW (ref 3.80–5.20)
RDW: 13.5 % (ref 11.5–14.5)
WBC: 4.5 x10 3/mm (ref 3.6–11.0)

## 2012-07-18 LAB — COMPREHENSIVE METABOLIC PANEL
Albumin: 3.7 g/dL (ref 3.4–5.0)
Alkaline Phosphatase: 128 U/L (ref 50–136)
Anion Gap: 7 (ref 7–16)
BUN: 16 mg/dL (ref 7–18)
Bilirubin,Total: 0.4 mg/dL (ref 0.2–1.0)
Calcium, Total: 9.5 mg/dL (ref 8.5–10.1)
Chloride: 106 mmol/L (ref 98–107)
Co2: 29 mmol/L (ref 21–32)
Creatinine: 0.73 mg/dL (ref 0.60–1.30)
EGFR (African American): >60
EGFR (Non-African Amer.): >60
Glucose: 93 mg/dL (ref 65–99)
Osmolality: 284 (ref 275–301)
Potassium: 3.9 mmol/L (ref 3.5–5.1)
SGOT(AST): 25 U/L (ref 15–37)
SGPT (ALT): 46 U/L (ref 12–78)
Sodium: 142 mmol/L (ref 136–145)
Total Protein: 6.7 g/dL (ref 6.4–8.2)

## 2012-07-18 LAB — HEPATIC FUNCTION PANEL A (ARMC): Bilirubin, Direct: <0.05 mg/dL (ref 0.00–0.20)

## 2012-07-25 LAB — CBC CANCER CENTER
Basophil #: 0 x10 3/mm (ref 0.0–0.1)
Basophil %: 1.1 %
Eosinophil #: 0.1 x10 3/mm (ref 0.0–0.7)
Eosinophil %: 2.1 %
HCT: 32.1 % — ABNORMAL LOW (ref 35.0–47.0)
HGB: 11.1 g/dL — ABNORMAL LOW (ref 12.0–16.0)
Lymphocyte #: 1.6 x10 3/mm (ref 1.0–3.6)
Lymphocyte %: 35.2 %
MCH: 33.2 pg (ref 26.0–34.0)
MCHC: 34.7 g/dL (ref 32.0–36.0)
MCV: 96 fL (ref 80–100)
Monocyte #: 0.4 x10 3/mm (ref 0.2–0.9)
Monocyte %: 8.7 %
Neutrophil #: 2.4 x10 3/mm (ref 1.4–6.5)
Neutrophil %: 52.9 %
Platelet: 296 x10 3/mm (ref 150–440)
RBC: 3.36 10*6/uL — ABNORMAL LOW (ref 3.80–5.20)
RDW: 13.4 % (ref 11.5–14.5)
WBC: 4.5 x10 3/mm (ref 3.6–11.0)

## 2012-08-01 LAB — CBC CANCER CENTER
Basophil #: 0 x10 3/mm (ref 0.0–0.1)
Basophil %: 0 %
Eosinophil #: 0 x10 3/mm (ref 0.0–0.7)
Eosinophil %: 0 %
HCT: 33 % — ABNORMAL LOW (ref 35.0–47.0)
HGB: 11.6 g/dL — ABNORMAL LOW (ref 12.0–16.0)
Lymphocyte #: 0.8 x10 3/mm — ABNORMAL LOW (ref 1.0–3.6)
Lymphocyte %: 13.3 %
MCH: 33.5 pg (ref 26.0–34.0)
MCHC: 35.3 g/dL (ref 32.0–36.0)
MCV: 95 fL (ref 80–100)
Monocyte #: 0.1 x10 3/mm — ABNORMAL LOW (ref 0.2–0.9)
Monocyte %: 1.6 %
Neutrophil #: 4.9 x10 3/mm (ref 1.4–6.5)
Neutrophil %: 85.1 %
Platelet: 300 x10 3/mm (ref 150–440)
RBC: 3.47 10*6/uL — ABNORMAL LOW (ref 3.80–5.20)
RDW: 13.4 % (ref 11.5–14.5)
WBC: 5.7 x10 3/mm (ref 3.6–11.0)

## 2012-08-08 LAB — CBC CANCER CENTER
Basophil #: 0 x10 3/mm (ref 0.0–0.1)
Basophil %: 0.2 %
Eosinophil #: 0 x10 3/mm (ref 0.0–0.7)
Eosinophil %: 0 %
HCT: 33.9 % — ABNORMAL LOW (ref 35.0–47.0)
HGB: 12 g/dL (ref 12.0–16.0)
Lymphocyte #: 0.7 x10 3/mm — ABNORMAL LOW (ref 1.0–3.6)
Lymphocyte %: 14.2 %
MCH: 33.8 pg (ref 26.0–34.0)
MCHC: 35.5 g/dL (ref 32.0–36.0)
MCV: 95 fL (ref 80–100)
Monocyte #: 0 x10 3/mm — ABNORMAL LOW (ref 0.2–0.9)
Monocyte %: 1 %
Neutrophil #: 4.2 x10 3/mm (ref 1.4–6.5)
Neutrophil %: 84.6 %
Platelet: 306 x10 3/mm (ref 150–440)
RBC: 3.56 10*6/uL — ABNORMAL LOW (ref 3.80–5.20)
RDW: 13.2 % (ref 11.5–14.5)
WBC: 5 x10 3/mm (ref 3.6–11.0)

## 2012-08-08 LAB — HEPATIC FUNCTION PANEL A (ARMC)
Albumin: 4 g/dL (ref 3.4–5.0)
Alkaline Phosphatase: 124 U/L (ref 50–136)
Bilirubin, Direct: <0.05 mg/dL (ref 0.00–0.20)
Bilirubin,Total: 0.3 mg/dL (ref 0.2–1.0)
SGOT(AST): 27 U/L (ref 15–37)
SGPT (ALT): 54 U/L (ref 12–78)
Total Protein: 7.1 g/dL (ref 6.4–8.2)

## 2012-08-08 LAB — CREATININE, SERUM
Creatinine: 0.77 mg/dL (ref 0.60–1.30)
EGFR (African American): >60
EGFR (Non-African Amer.): >60

## 2012-08-10 ENCOUNTER — Ambulatory Visit: Payer: Self-pay | Admitting: Radiation Oncology

## 2012-08-10 ENCOUNTER — Ambulatory Visit: Payer: Self-pay | Admitting: Internal Medicine

## 2012-08-15 LAB — BASIC METABOLIC PANEL
Anion Gap: 10 (ref 7–16)
BUN: 14 mg/dL (ref 7–18)
Calcium, Total: 9.3 mg/dL (ref 8.5–10.1)
Chloride: 102 mmol/L (ref 98–107)
Co2: 24 mmol/L (ref 21–32)
Creatinine: 0.86 mg/dL (ref 0.60–1.30)
EGFR (African American): >60
EGFR (Non-African Amer.): >60
Glucose: 170 mg/dL — ABNORMAL HIGH (ref 65–99)
Osmolality: 276 (ref 275–301)
Potassium: 3.9 mmol/L (ref 3.5–5.1)
Sodium: 136 mmol/L (ref 136–145)

## 2012-08-15 LAB — CBC CANCER CENTER
Basophil #: 0 x10 3/mm (ref 0.0–0.1)
Basophil %: 0.1 %
Eosinophil #: 0 x10 3/mm (ref 0.0–0.7)
Eosinophil %: 0 %
HCT: 33.8 % — ABNORMAL LOW (ref 35.0–47.0)
HGB: 12.1 g/dL (ref 12.0–16.0)
Lymphocyte #: 0.8 x10 3/mm — ABNORMAL LOW (ref 1.0–3.6)
Lymphocyte %: 19.3 %
MCH: 33.7 pg (ref 26.0–34.0)
MCHC: 35.7 g/dL (ref 32.0–36.0)
MCV: 94 fL (ref 80–100)
Monocyte #: 0 x10 3/mm — ABNORMAL LOW (ref 0.2–0.9)
Monocyte %: 1.1 %
Neutrophil #: 3.1 x10 3/mm (ref 1.4–6.5)
Neutrophil %: 79.5 %
Platelet: 281 x10 3/mm (ref 150–440)
RBC: 3.58 10*6/uL — ABNORMAL LOW (ref 3.80–5.20)
RDW: 13.3 % (ref 11.5–14.5)
WBC: 3.9 x10 3/mm (ref 3.6–11.0)

## 2012-08-22 LAB — CBC CANCER CENTER
Basophil #: 0.1 x10 3/mm (ref 0.0–0.1)
Basophil %: 0.5 %
Eosinophil #: 0 x10 3/mm (ref 0.0–0.7)
Eosinophil %: 0 %
HCT: 34.9 % — ABNORMAL LOW (ref 35.0–47.0)
HGB: 12.1 g/dL (ref 12.0–16.0)
Lymphocyte #: 1.1 x10 3/mm (ref 1.0–3.6)
Lymphocyte %: 10.4 %
MCH: 32.9 pg (ref 26.0–34.0)
MCHC: 34.7 g/dL (ref 32.0–36.0)
MCV: 95 fL (ref 80–100)
Monocyte #: 0.1 x10 3/mm — ABNORMAL LOW (ref 0.2–0.9)
Monocyte %: 0.8 %
Neutrophil #: 9.4 x10 3/mm — ABNORMAL HIGH (ref 1.4–6.5)
Neutrophil %: 88.3 %
Platelet: 266 x10 3/mm (ref 150–440)
RBC: 3.69 10*6/uL — ABNORMAL LOW (ref 3.80–5.20)
RDW: 13.1 % (ref 11.5–14.5)
WBC: 10.6 x10 3/mm (ref 3.6–11.0)

## 2012-08-22 LAB — BASIC METABOLIC PANEL
Anion Gap: 11 (ref 7–16)
BUN: 17 mg/dL (ref 7–18)
Calcium, Total: 9.6 mg/dL (ref 8.5–10.1)
Chloride: 99 mmol/L (ref 98–107)
Co2: 27 mmol/L (ref 21–32)
Creatinine: 0.87 mg/dL (ref 0.60–1.30)
EGFR (African American): >60
EGFR (Non-African Amer.): >60
Glucose: 143 mg/dL — ABNORMAL HIGH (ref 65–99)
Osmolality: 278 (ref 275–301)
Potassium: 4 mmol/L (ref 3.5–5.1)
Sodium: 137 mmol/L (ref 136–145)

## 2012-08-29 LAB — CBC CANCER CENTER
Basophil #: 0 x10 3/mm (ref 0.0–0.1)
Basophil %: 0.1 %
Eosinophil #: 0 x10 3/mm (ref 0.0–0.7)
Eosinophil %: 0.1 %
HCT: 31.9 % — ABNORMAL LOW (ref 35.0–47.0)
HGB: 11.3 g/dL — ABNORMAL LOW (ref 12.0–16.0)
Lymphocyte #: 1.6 x10 3/mm (ref 1.0–3.6)
Lymphocyte %: 16.3 %
MCH: 33.2 pg (ref 26.0–34.0)
MCHC: 35.3 g/dL (ref 32.0–36.0)
MCV: 94 fL (ref 80–100)
Monocyte #: 0.3 x10 3/mm (ref 0.2–0.9)
Monocyte %: 3.5 %
Neutrophil #: 7.7 x10 3/mm — ABNORMAL HIGH (ref 1.4–6.5)
Neutrophil %: 80 %
Platelet: 255 x10 3/mm (ref 150–440)
RBC: 3.39 10*6/uL — ABNORMAL LOW (ref 3.80–5.20)
RDW: 13.2 % (ref 11.5–14.5)
WBC: 9.6 x10 3/mm (ref 3.6–11.0)

## 2012-09-05 LAB — CBC CANCER CENTER
Basophil #: 0 x10 3/mm (ref 0.0–0.1)
Basophil %: 0.8 %
Eosinophil #: 0.1 x10 3/mm (ref 0.0–0.7)
Eosinophil %: 2.2 %
HCT: 34.8 % — ABNORMAL LOW (ref 35.0–47.0)
HGB: 12 g/dL (ref 12.0–16.0)
Lymphocyte #: 1.5 x10 3/mm (ref 1.0–3.6)
Lymphocyte %: 36.9 %
MCH: 32.6 pg (ref 26.0–34.0)
MCHC: 34.5 g/dL (ref 32.0–36.0)
MCV: 95 fL (ref 80–100)
Monocyte #: 0.2 x10 3/mm (ref 0.2–0.9)
Monocyte %: 6.2 %
Neutrophil #: 2.1 x10 3/mm (ref 1.4–6.5)
Neutrophil %: 53.9 %
Platelet: 318 x10 3/mm (ref 150–440)
RBC: 3.68 10*6/uL — ABNORMAL LOW (ref 3.80–5.20)
RDW: 13.5 % (ref 11.5–14.5)
WBC: 4 x10 3/mm (ref 3.6–11.0)

## 2012-09-10 ENCOUNTER — Ambulatory Visit: Payer: Self-pay | Admitting: Internal Medicine

## 2012-09-10 ENCOUNTER — Ambulatory Visit: Payer: Self-pay | Admitting: Radiation Oncology

## 2012-09-12 LAB — CBC CANCER CENTER
Basophil #: 0 x10 3/mm (ref 0.0–0.1)
Basophil %: 1.1 %
Eosinophil #: 0.1 x10 3/mm (ref 0.0–0.7)
Eosinophil %: 2 %
HCT: 34.1 % — ABNORMAL LOW (ref 35.0–47.0)
HGB: 11.7 g/dL — ABNORMAL LOW (ref 12.0–16.0)
Lymphocyte #: 1.5 x10 3/mm (ref 1.0–3.6)
Lymphocyte %: 47.2 %
MCH: 32.3 pg (ref 26.0–34.0)
MCHC: 34.3 g/dL (ref 32.0–36.0)
MCV: 94 fL (ref 80–100)
Monocyte #: 0.3 x10 3/mm (ref 0.2–0.9)
Monocyte %: 8.8 %
Neutrophil #: 1.3 x10 3/mm — ABNORMAL LOW (ref 1.4–6.5)
Neutrophil %: 40.9 %
Platelet: 320 x10 3/mm (ref 150–440)
RBC: 3.62 10*6/uL — ABNORMAL LOW (ref 3.80–5.20)
RDW: 14 % (ref 11.5–14.5)
WBC: 3.1 x10 3/mm — ABNORMAL LOW (ref 3.6–11.0)

## 2012-09-12 LAB — HEPATIC FUNCTION PANEL A (ARMC)
Albumin: 3.7 g/dL (ref 3.4–5.0)
Alkaline Phosphatase: 129 U/L (ref 50–136)
Bilirubin, Direct: 0.1 mg/dL (ref 0.00–0.20)
Bilirubin,Total: 0.2 mg/dL (ref 0.2–1.0)
SGOT(AST): 22 U/L (ref 15–37)
SGPT (ALT): 39 U/L (ref 12–78)
Total Protein: 6.6 g/dL (ref 6.4–8.2)

## 2012-09-12 LAB — CREATININE, SERUM
Creatinine: 0.82 mg/dL (ref 0.60–1.30)
EGFR (African American): >60
EGFR (Non-African Amer.): >60

## 2012-09-19 LAB — CBC CANCER CENTER
Basophil #: 0 x10 3/mm (ref 0.0–0.1)
Basophil %: 0.8 %
Eosinophil #: 0 x10 3/mm (ref 0.0–0.7)
Eosinophil %: 0.9 %
HCT: 35 % (ref 35.0–47.0)
HGB: 12.1 g/dL (ref 12.0–16.0)
Lymphocyte #: 1.7 x10 3/mm (ref 1.0–3.6)
Lymphocyte %: 35.9 %
MCH: 32.7 pg (ref 26.0–34.0)
MCHC: 34.7 g/dL (ref 32.0–36.0)
MCV: 94 fL (ref 80–100)
Monocyte #: 0.3 x10 3/mm (ref 0.2–0.9)
Monocyte %: 7 %
Neutrophil #: 2.7 x10 3/mm (ref 1.4–6.5)
Neutrophil %: 55.4 %
Platelet: 294 x10 3/mm (ref 150–440)
RBC: 3.71 10*6/uL — ABNORMAL LOW (ref 3.80–5.20)
RDW: 13.7 % (ref 11.5–14.5)
WBC: 4.8 x10 3/mm (ref 3.6–11.0)

## 2012-09-26 LAB — BASIC METABOLIC PANEL
Anion Gap: 9 (ref 7–16)
BUN: 19 mg/dL — ABNORMAL HIGH (ref 7–18)
Calcium, Total: 9.8 mg/dL (ref 8.5–10.1)
Chloride: 104 mmol/L (ref 98–107)
Co2: 27 mmol/L (ref 21–32)
Creatinine: 0.76 mg/dL (ref 0.60–1.30)
EGFR (African American): >60
EGFR (Non-African Amer.): >60
Glucose: 114 mg/dL — ABNORMAL HIGH (ref 65–99)
Osmolality: 283 (ref 275–301)
Potassium: 3.7 mmol/L (ref 3.5–5.1)
Sodium: 140 mmol/L (ref 136–145)

## 2012-09-26 LAB — CBC CANCER CENTER
Basophil #: 0 x10 3/mm (ref 0.0–0.1)
Basophil %: 0.8 %
Eosinophil #: 0 x10 3/mm (ref 0.0–0.7)
Eosinophil %: 0.7 %
HCT: 32.2 % — ABNORMAL LOW (ref 35.0–47.0)
HGB: 11.2 g/dL — ABNORMAL LOW (ref 12.0–16.0)
Lymphocyte #: 1.9 x10 3/mm (ref 1.0–3.6)
Lymphocyte %: 34.5 %
MCH: 33.1 pg (ref 26.0–34.0)
MCHC: 34.8 g/dL (ref 32.0–36.0)
MCV: 95 fL (ref 80–100)
Monocyte #: 0.4 x10 3/mm (ref 0.2–0.9)
Monocyte %: 7.1 %
Neutrophil #: 3.1 x10 3/mm (ref 1.4–6.5)
Neutrophil %: 56.9 %
Platelet: 322 x10 3/mm (ref 150–440)
RBC: 3.39 10*6/uL — ABNORMAL LOW (ref 3.80–5.20)
RDW: 14.6 % — ABNORMAL HIGH (ref 11.5–14.5)
WBC: 5.4 x10 3/mm (ref 3.6–11.0)

## 2012-09-26 LAB — HEPATIC FUNCTION PANEL A (ARMC)
Albumin: 3.8 g/dL (ref 3.4–5.0)
Alkaline Phosphatase: 119 U/L (ref 50–136)
Bilirubin, Direct: 0.1 mg/dL (ref 0.00–0.20)
Bilirubin,Total: 0.4 mg/dL (ref 0.2–1.0)
SGOT(AST): 26 U/L (ref 15–37)
SGPT (ALT): 45 U/L (ref 12–78)
Total Protein: 6.7 g/dL (ref 6.4–8.2)

## 2012-10-10 ENCOUNTER — Ambulatory Visit: Payer: Self-pay | Admitting: Radiation Oncology

## 2012-10-10 ENCOUNTER — Ambulatory Visit: Payer: Self-pay | Admitting: Internal Medicine

## 2012-10-17 LAB — CBC CANCER CENTER
Basophil #: 0 x10 3/mm (ref 0.0–0.1)
Basophil %: 0.6 %
Eosinophil #: 0.1 x10 3/mm (ref 0.0–0.7)
Eosinophil %: 1.6 %
HCT: 33.6 % — ABNORMAL LOW (ref 35.0–47.0)
HGB: 11.5 g/dL — ABNORMAL LOW (ref 12.0–16.0)
Lymphocyte #: 1.7 x10 3/mm (ref 1.0–3.6)
Lymphocyte %: 30 %
MCH: 32.6 pg (ref 26.0–34.0)
MCHC: 34.1 g/dL (ref 32.0–36.0)
MCV: 95 fL (ref 80–100)
Monocyte #: 0.6 x10 3/mm (ref 0.2–0.9)
Monocyte %: 10.4 %
Neutrophil #: 3.2 x10 3/mm (ref 1.4–6.5)
Neutrophil %: 57.4 %
Platelet: 248 x10 3/mm (ref 150–440)
RBC: 3.52 10*6/uL — ABNORMAL LOW (ref 3.80–5.20)
RDW: 13.7 % (ref 11.5–14.5)
WBC: 5.5 x10 3/mm (ref 3.6–11.0)

## 2012-10-17 LAB — CREATININE, SERUM
Creatinine: 0.72 mg/dL (ref 0.60–1.30)
EGFR (African American): >60
EGFR (Non-African Amer.): >60

## 2012-11-01 LAB — CBC CANCER CENTER
Basophil #: 0 x10 3/mm (ref 0.0–0.1)
Basophil %: 0.5 %
Eosinophil #: 0.1 x10 3/mm (ref 0.0–0.7)
Eosinophil %: 2.1 %
HCT: 35.4 % (ref 35.0–47.0)
HGB: 12.2 g/dL (ref 12.0–16.0)
Lymphocyte #: 1.5 x10 3/mm (ref 1.0–3.6)
Lymphocyte %: 28.4 %
MCH: 32.8 pg (ref 26.0–34.0)
MCHC: 34.6 g/dL (ref 32.0–36.0)
MCV: 95 fL (ref 80–100)
Monocyte #: 0.5 x10 3/mm (ref 0.2–0.9)
Monocyte %: 10.2 %
Neutrophil #: 3.1 x10 3/mm (ref 1.4–6.5)
Neutrophil %: 58.8 %
Platelet: 271 x10 3/mm (ref 150–440)
RBC: 3.73 10*6/uL — ABNORMAL LOW (ref 3.80–5.20)
RDW: 13.4 % (ref 11.5–14.5)
WBC: 5.3 x10 3/mm (ref 3.6–11.0)

## 2012-11-10 ENCOUNTER — Ambulatory Visit: Payer: Self-pay | Admitting: Radiation Oncology

## 2012-11-10 ENCOUNTER — Ambulatory Visit: Payer: Self-pay | Admitting: Internal Medicine

## 2012-11-15 LAB — CBC CANCER CENTER
Basophil #: 0 x10 3/mm (ref 0.0–0.1)
Basophil %: 0.6 %
Eosinophil #: 0.1 x10 3/mm (ref 0.0–0.7)
Eosinophil %: 1.7 %
HCT: 33.9 % — ABNORMAL LOW (ref 35.0–47.0)
HGB: 12 g/dL (ref 12.0–16.0)
Lymphocyte #: 1.5 x10 3/mm (ref 1.0–3.6)
Lymphocyte %: 25.7 %
MCH: 32.8 pg (ref 26.0–34.0)
MCHC: 35.3 g/dL (ref 32.0–36.0)
MCV: 93 fL (ref 80–100)
Monocyte #: 0.5 x10 3/mm (ref 0.2–0.9)
Monocyte %: 9.4 %
Neutrophil #: 3.7 x10 3/mm (ref 1.4–6.5)
Neutrophil %: 62.6 %
Platelet: 259 x10 3/mm (ref 150–440)
RBC: 3.65 10*6/uL — ABNORMAL LOW (ref 3.80–5.20)
RDW: 12.9 % (ref 11.5–14.5)
WBC: 5.8 x10 3/mm (ref 3.6–11.0)

## 2012-11-22 LAB — CBC CANCER CENTER
Basophil #: 0 x10 3/mm (ref 0.0–0.1)
Basophil %: 0.6 %
Eosinophil #: 0.1 x10 3/mm (ref 0.0–0.7)
Eosinophil %: 2.6 %
HCT: 35.3 % (ref 35.0–47.0)
HGB: 11.8 g/dL — ABNORMAL LOW (ref 12.0–16.0)
Lymphocyte #: 1.6 x10 3/mm (ref 1.0–3.6)
Lymphocyte %: 30.4 %
MCH: 31.4 pg (ref 26.0–34.0)
MCHC: 33.5 g/dL (ref 32.0–36.0)
MCV: 94 fL (ref 80–100)
Monocyte #: 0.6 x10 3/mm (ref 0.2–0.9)
Monocyte %: 11.6 %
Neutrophil #: 3 x10 3/mm (ref 1.4–6.5)
Neutrophil %: 54.8 %
Platelet: 272 x10 3/mm (ref 150–440)
RBC: 3.77 10*6/uL — ABNORMAL LOW (ref 3.80–5.20)
RDW: 13 % (ref 11.5–14.5)
WBC: 5.4 x10 3/mm (ref 3.6–11.0)

## 2012-12-10 ENCOUNTER — Ambulatory Visit: Payer: Self-pay | Admitting: Radiation Oncology

## 2012-12-10 ENCOUNTER — Ambulatory Visit: Payer: Self-pay | Admitting: Internal Medicine

## 2012-12-10 HISTORY — PX: BREAST BIOPSY: SHX20

## 2012-12-19 ENCOUNTER — Ambulatory Visit: Payer: Self-pay | Admitting: General Surgery

## 2012-12-19 LAB — BASIC METABOLIC PANEL
Anion Gap: 10 (ref 7–16)
BUN: 19 mg/dL — ABNORMAL HIGH (ref 7–18)
Calcium, Total: 9.5 mg/dL (ref 8.5–10.1)
Chloride: 105 mmol/L (ref 98–107)
Co2: 26 mmol/L (ref 21–32)
Creatinine: 0.75 mg/dL (ref 0.60–1.30)
EGFR (African American): >60
EGFR (Non-African Amer.): >60
Glucose: 93 mg/dL (ref 65–99)
Osmolality: 283 (ref 275–301)
Potassium: 4.1 mmol/L (ref 3.5–5.1)
Sodium: 141 mmol/L (ref 136–145)

## 2012-12-19 LAB — CBC CANCER CENTER
Basophil #: 0 x10 3/mm (ref 0.0–0.1)
Basophil %: 0.5 %
Eosinophil #: 0.1 x10 3/mm (ref 0.0–0.7)
Eosinophil %: 1.6 %
HCT: 37.7 % (ref 35.0–47.0)
HGB: 12.6 g/dL (ref 12.0–16.0)
Lymphocyte #: 1.6 x10 3/mm (ref 1.0–3.6)
Lymphocyte %: 30.9 %
MCH: 31 pg (ref 26.0–34.0)
MCHC: 33.3 g/dL (ref 32.0–36.0)
MCV: 93 fL (ref 80–100)
Monocyte #: 0.5 x10 3/mm (ref 0.2–0.9)
Monocyte %: 9.1 %
Neutrophil #: 3 x10 3/mm (ref 1.4–6.5)
Neutrophil %: 57.9 %
Platelet: 263 x10 3/mm (ref 150–440)
RBC: 4.05 10*6/uL (ref 3.80–5.20)
RDW: 12.5 % (ref 11.5–14.5)
WBC: 5.2 x10 3/mm (ref 3.6–11.0)

## 2012-12-19 LAB — HEPATIC FUNCTION PANEL A (ARMC)
Albumin: 3.8 g/dL (ref 3.4–5.0)
Alkaline Phosphatase: 135 U/L — ABNORMAL HIGH
Bilirubin, Direct: 0.1 mg/dL (ref 0.00–0.20)
Bilirubin,Total: 0.3 mg/dL (ref 0.2–1.0)
SGOT(AST): 24 U/L (ref 15–37)
SGPT (ALT): 40 U/L (ref 12–78)
Total Protein: 7 g/dL (ref 6.4–8.2)

## 2012-12-20 ENCOUNTER — Encounter: Payer: Self-pay | Admitting: General Surgery

## 2012-12-26 ENCOUNTER — Encounter: Payer: Self-pay | Admitting: General Surgery

## 2012-12-26 ENCOUNTER — Ambulatory Visit (INDEPENDENT_AMBULATORY_CARE_PROVIDER_SITE_OTHER): Payer: Federal, State, Local not specified - PPO | Admitting: General Surgery

## 2012-12-26 ENCOUNTER — Other Ambulatory Visit: Payer: Self-pay

## 2012-12-26 VITALS — BP 148/78 | HR 76 | Resp 12 | Ht 62.0 in | Wt 202.0 lb

## 2012-12-26 DIAGNOSIS — N644 Mastodynia: Secondary | ICD-10-CM

## 2012-12-26 DIAGNOSIS — Z853 Personal history of malignant neoplasm of breast: Secondary | ICD-10-CM

## 2012-12-26 DIAGNOSIS — R928 Other abnormal and inconclusive findings on diagnostic imaging of breast: Secondary | ICD-10-CM

## 2012-12-26 HISTORY — PX: BREAST SURGERY: SHX581

## 2012-12-26 NOTE — Patient Instructions (Addendum)
Patient have bilateral diagnotic mammogram    CARE AFTER BREAST BIOPSY  1. Leave the dressing on that your doctor applied after surgery. It is waterproof. You may bathe, shower and/or swim. The dressing will probably remain intact until your return office visit. If the dressing comes off, you will see small strips of tape against your skin on the incision. Do not remove these strips.  2. You may want to use a gauze,cloth or similar protection in your bra to prevent rubbing against your dressing and incision. This is not necessary, but you may feel more comfortable doing so.  3. It is recommended that you wear a bra day and night to give support to the breast. This will prevent the weight of the breast from pulling on the incision.  4. Your breast will feel hard and lumpy under the incision. Do not be alarmed. This is the underlying stitching of tissue. Softening of this tissue will occur in time.  5. Make sure you call the office and schedule an appointment in one week after your surgery. The office phone number is [phone]. The [facility] may have already done this for you.  6. You will notice about a week after your office visit that the strips of the tape on your incision will begin to loosen. These may then be removed.  7. Report to your doctor any of the following:  * Severe pain not relieved by your pain medication  *Redness of the incision  * Drainage from the incision  *Fever greater than 101 degrees

## 2012-12-26 NOTE — Progress Notes (Signed)
Patient ID: Katrina Holland, female   DOB: [birth year], 52 y.o.   MRN: [id number]  Chief Complaint  Patient presents with  . Follow-up    mammogram    HPI Katrina Holland is a 52 y.o. female who presents for a breast evaluation. The most recent left breast mammogram and ultrasound was done on 12/19/12.Patient does perform regular self breast checks and gets regular mammograms done.  Patient still doing chemo every three weeks.(Herceptin) She states there is some throbbing in her left breast .  The patient completed whole breast radiation mid-November, 2014.  HPI  Past Medical History  Diagnosis Date  . Breast mass   . Anxiety   . GERD (gastroesophageal reflux disease)   . Pulmonary hypertension 2013  . Irritable bowel syndrome   . Endometriosis 2001  . Sleep apnea 2007  . 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    Past Surgical History  Procedure Laterality Date  . Breast enhancement surgery  2014  . Port a cath revision  2014  . Dilation and curettage of uterus  2011  . Ovary surgery Right 2001  . Nissen fundoplication  1997  . Tonsillectomy  1969  . Hernia repair  1997  . Colonoscopy  2012  . Right heart cath  2013  . Exploratory laparotomy  2001  . Breast surgery Left 01/30/2012    Wide excision, sentinel node biopsy  . Breast surgery Left 02/09/2012    Left breast reduction/reconstruction: Caryl Asp, MD.    Family History  Problem Relation Age of Onset  . Colon cancer Paternal Grandfather     Social History History  Substance Use Topics  . Smoking status: Former Smoker -- 5.00 packs/day for 5 years  . Smokeless tobacco: Never Used  . Alcohol Use: No    No Known Allergies  Current Outpatient Prescriptions  Medication Sig Dispense Refill  . anastrozole (ARIMIDEX) 1 MG tablet       . diclofenac (VOLTAREN) 75 MG EC tablet  Take 1 tablet by mouth 2 (two) times daily.      . sertraline (ZOLOFT) 100 MG tablet Take 100 mg by mouth daily.      . silver sulfADIAZINE (SILVADENE) 1 % cream        No current facility-administered medications for this visit.    Review of Systems Review of Systems  Constitutional: Negative.   Respiratory: Negative.   Cardiovascular: Negative.     Blood pressure 148/78, pulse 76, resp. rate 12, height 5\' 2"  (1.575 m), weight 202 lb (91.627 kg).  Physical Exam Physical Exam  Constitutional: She is oriented to person, place, and time. She appears well-developed and well-nourished.  Eyes: No scleral icterus.  Cardiovascular: Normal rate, regular rhythm and normal heart sounds.   Pulmonary/Chest: Breath sounds normal. Right breast exhibits no inverted nipple, no mass, no nipple discharge, no skin change and no tenderness. Left breast exhibits no inverted nipple, no mass, no nipple discharge, no skin change and no tenderness. Breasts are asymmetrical (right breast one cup size bigger than left.).  Port in the right upper inner quadrant. Slight ridge in the  mammary fold on the left side.Well healed incision in the upper outer quadrant of the left breast.   Lymphadenopathy:    She has no cervical adenopathy.    She has no axillary adenopathy.  Neurological: She is alert and oriented  to person, place, and time.  Skin: Skin is warm and dry.    Data Reviewed Left breast mammogram dated 12/19/2012 showed a small circumscribed mass in the far left lateral breast at 2:30 with increased vascularity. Well-circumscribed hypoechoic mass in the 3:00 position 8 cm from the nipple, likely a small cyst. Ultrasound guided biopsy of the 2:30 o'clock lesion recommended. Six-month followup of the 3:00 lesion recommended. BI-RAD-4  Ultrasound examination of the left breast was completed. In the 3:00 position close an additional nipple a 0.3 x 0.35 x 0.5 cm hypoechoic nodule is identified. This was  aspirated with complete resolution.  At the 2:30 o'clock position 9 cm in the nipple, just superior to her wide excision site an area corresponding to the ultrasound abnormality reported above showed multiple small cystic areas. The area was infiltrated with 10 cc of 0.5% Xylocaine with 0.25% Marcaine with 1-200,000 units of epinephrine. A 10-gauge Encor device was visualized and 10 core samples obtained. A postbiopsy clip was placed. Skin defect was closed with benzoin and Steri-Strips followed by Telfa and Tegaderm dressing the should was well tolerated.  Assessment    Modest mammographic changes, first imaging studies status post completion of all breast radiation.    Plan    The patient was anxious to have tissue confirmation of a benign process. She was amenable to ultrasound-guided biopsy. This was completed without incident. She'll be contacted when pathology results are available. Assuming benign resolved she will follow up with the nurse in one week, and have bilateral diagnostic mammograms in 6 months.       Earline Mayotte 12/29/2012, 10:59 AM

## 2012-12-27 ENCOUNTER — Telehealth: Payer: Self-pay | Admitting: *Deleted

## 2012-12-27 LAB — PATHOLOGY

## 2012-12-27 NOTE — Telephone Encounter (Signed)
Notified patient as instructed, per Dr Lemar Livings recent breast biopsy was benign scarring from previuos surgery, patient pleased. Discussed follow-up appointments, patient agrees.

## 2012-12-28 LAB — FINE-NEEDLE ASPIRATION

## 2012-12-29 ENCOUNTER — Encounter: Payer: Self-pay | Admitting: General Surgery

## 2012-12-29 DIAGNOSIS — R928 Other abnormal and inconclusive findings on diagnostic imaging of breast: Secondary | ICD-10-CM | POA: Insufficient documentation

## 2013-01-01 ENCOUNTER — Telehealth: Payer: Self-pay | Admitting: *Deleted

## 2013-01-01 ENCOUNTER — Ambulatory Visit: Payer: Federal, State, Local not specified - PPO

## 2013-01-01 NOTE — Telephone Encounter (Signed)
PT CALLED AND HAD AN APPT TODAY 01/01/13 AT 3:00 AND CANCELED IT BECAUSE SHE IS HEELING FINE, BUT STILL WANTED YOU TO CALL HER TODAY ABOUT HER RESULTS.

## 2013-01-01 NOTE — Telephone Encounter (Signed)
Notified patient as instructed, patient pleased. Discussed follow-up appointments, patient agrees  

## 2013-01-02 ENCOUNTER — Other Ambulatory Visit: Payer: Self-pay | Admitting: General Surgery

## 2013-01-02 ENCOUNTER — Other Ambulatory Visit: Payer: Federal, State, Local not specified - PPO

## 2013-01-02 ENCOUNTER — Other Ambulatory Visit: Payer: Self-pay | Admitting: Radiology

## 2013-01-02 DIAGNOSIS — N644 Mastodynia: Secondary | ICD-10-CM

## 2013-01-10 ENCOUNTER — Ambulatory Visit: Payer: Self-pay | Admitting: Radiation Oncology

## 2013-01-10 ENCOUNTER — Ambulatory Visit: Payer: Self-pay | Admitting: Internal Medicine

## 2013-01-10 HISTORY — PX: REDUCTION MAMMAPLASTY: SUR839

## 2013-01-10 HISTORY — PX: FACIAL COSMETIC SURGERY: SHX629

## 2013-01-21 ENCOUNTER — Encounter: Payer: Self-pay | Admitting: General Surgery

## 2013-02-10 ENCOUNTER — Ambulatory Visit: Payer: Self-pay | Admitting: Radiation Oncology

## 2013-02-10 ENCOUNTER — Ambulatory Visit: Payer: Self-pay | Admitting: Internal Medicine

## 2013-02-27 LAB — HEPATIC FUNCTION PANEL A (ARMC)
Albumin: 3.9 g/dL (ref 3.4–5.0)
Alkaline Phosphatase: 137 U/L — ABNORMAL HIGH
Bilirubin, Direct: 0.1 mg/dL (ref 0.00–0.20)
Bilirubin,Total: 0.3 mg/dL (ref 0.2–1.0)
SGOT(AST): 24 U/L (ref 15–37)
SGPT (ALT): 44 U/L (ref 12–78)
Total Protein: 6.7 g/dL (ref 6.4–8.2)

## 2013-02-27 LAB — CBC CANCER CENTER
Basophil #: 0 x10 3/mm (ref 0.0–0.1)
Basophil %: 0.5 %
Eosinophil #: 0.2 x10 3/mm (ref 0.0–0.7)
Eosinophil %: 3 %
HCT: 36.1 % (ref 35.0–47.0)
HGB: 12.4 g/dL (ref 12.0–16.0)
Lymphocyte #: 1.5 x10 3/mm (ref 1.0–3.6)
Lymphocyte %: 29.6 %
MCH: 31.9 pg (ref 26.0–34.0)
MCHC: 34.4 g/dL (ref 32.0–36.0)
MCV: 93 fL (ref 80–100)
Monocyte #: 0.4 x10 3/mm (ref 0.2–0.9)
Monocyte %: 8.5 %
Neutrophil #: 2.9 x10 3/mm (ref 1.4–6.5)
Neutrophil %: 58.4 %
Platelet: 257 x10 3/mm (ref 150–440)
RBC: 3.89 10*6/uL (ref 3.80–5.20)
RDW: 13.1 % (ref 11.5–14.5)
WBC: 5 x10 3/mm (ref 3.6–11.0)

## 2013-02-27 LAB — BASIC METABOLIC PANEL
Anion Gap: 10 (ref 7–16)
BUN: 23 mg/dL — ABNORMAL HIGH (ref 7–18)
Calcium, Total: 8.5 mg/dL (ref 8.5–10.1)
Chloride: 105 mmol/L (ref 98–107)
Co2: 26 mmol/L (ref 21–32)
Creatinine: 0.83 mg/dL (ref 0.60–1.30)
EGFR (African American): >60
EGFR (Non-African Amer.): >60
Glucose: 106 mg/dL — ABNORMAL HIGH (ref 65–99)
Osmolality: 285 (ref 275–301)
Potassium: 4.2 mmol/L (ref 3.5–5.1)
Sodium: 141 mmol/L (ref 136–145)

## 2013-03-04 LAB — URINALYSIS, COMPLETE
Bacteria: NONE SEEN
Bilirubin,UR: NEGATIVE
Blood: NEGATIVE
Glucose,UR: NEGATIVE mg/dL (ref 0–75)
Nitrite: NEGATIVE
Ph: 5 (ref 4.5–8.0)
Protein: NEGATIVE
RBC,UR: 2 /HPF (ref 0–5)
Specific Gravity: 1.025 (ref 1.003–1.030)
Squamous Epithelial: 3
WBC UR: 3 /HPF (ref 0–5)

## 2013-03-04 LAB — RAPID INFLUENZA A&B ANTIGENS

## 2013-03-06 LAB — URINE CULTURE

## 2013-03-09 LAB — CULTURE, BLOOD (SINGLE)

## 2013-03-10 ENCOUNTER — Ambulatory Visit: Payer: Self-pay | Admitting: Internal Medicine

## 2013-03-20 LAB — CREATININE, SERUM
Creatinine: 0.9 mg/dL (ref 0.60–1.30)
EGFR (African American): >60
EGFR (Non-African Amer.): >60

## 2013-03-20 LAB — POTASSIUM: Potassium: 4.1 mmol/L (ref 3.5–5.1)

## 2013-04-08 ENCOUNTER — Ambulatory Visit (INDEPENDENT_AMBULATORY_CARE_PROVIDER_SITE_OTHER): Payer: Federal, State, Local not specified - PPO | Admitting: Internal Medicine

## 2013-04-08 ENCOUNTER — Other Ambulatory Visit: Payer: Self-pay | Admitting: Internal Medicine

## 2013-04-08 ENCOUNTER — Ambulatory Visit (INDEPENDENT_AMBULATORY_CARE_PROVIDER_SITE_OTHER)
Admission: RE | Admit: 2013-04-08 | Discharge: 2013-04-08 | Disposition: A | Discharge status: Home / Self Care | Payer: Federal, State, Local not specified - PPO | Source: Ambulatory Visit | Attending: Internal Medicine | Admitting: Internal Medicine

## 2013-04-08 ENCOUNTER — Encounter: Payer: Self-pay | Admitting: Internal Medicine

## 2013-04-08 VITALS — BP 124/80 | HR 64 | Temp 97.7°F | Ht 62.0 in | Wt 210.0 lb

## 2013-04-08 DIAGNOSIS — R0602 Shortness of breath: Secondary | ICD-10-CM

## 2013-04-08 DIAGNOSIS — R059 Cough, unspecified: Secondary | ICD-10-CM

## 2013-04-08 DIAGNOSIS — R058 Other specified cough: Secondary | ICD-10-CM | POA: Insufficient documentation

## 2013-04-08 DIAGNOSIS — R05 Cough: Secondary | ICD-10-CM

## 2013-04-08 DIAGNOSIS — R069 Unspecified abnormalities of breathing: Secondary | ICD-10-CM | POA: Insufficient documentation

## 2013-04-08 DIAGNOSIS — R0609 Other forms of dyspnea: Secondary | ICD-10-CM

## 2013-04-08 DIAGNOSIS — R0989 Other specified symptoms and signs involving the circulatory and respiratory systems: Secondary | ICD-10-CM

## 2013-04-08 DIAGNOSIS — R0600 Dyspnea, unspecified: Secondary | ICD-10-CM | POA: Insufficient documentation

## 2013-04-08 MED ORDER — PREDNISONE 10 MG PO TABS: ORAL_TABLET | ORAL | Status: DC

## 2013-04-08 MED ORDER — PANTOPRAZOLE SODIUM 40 MG PO TBEC: 40.0000 mg | DELAYED_RELEASE_TABLET | Freq: Every day | ORAL | Status: DC

## 2013-04-08 MED ORDER — CEFDINIR 300 MG PO CAPS: 300.0000 mg | ORAL_CAPSULE | Freq: Two times a day (BID) | ORAL | Status: DC

## 2013-04-08 MED ORDER — TRAMADOL HCL 50 MG PO TABS: ORAL_TABLET | ORAL | Status: DC

## 2013-04-08 MED ORDER — RANITIDINE HCL 150 MG PO TABS: ORAL_TABLET | ORAL | Status: DC

## 2013-04-08 NOTE — Assessment & Plan Note (Signed)
-   04/08/2013  Walked RA x 3 laps @ 185 ft each stopped due to  End of study no desat or sob   Neg eval by [name] I suspect because her problem is uacs, not truly doe, though hard to be sure. Will see her back p correct the cough then consider cpst at a later date.

## 2013-04-08 NOTE — Progress Notes (Signed)
Quick Note:  Spoke with pt and notified of results per Dr. Melvyn Novas. Pt verbalized understanding and denied any questions. She states that she is allergic to augmentin "makes me sick" Per MW- call in omnicef 300 mg bid x 10 days ______

## 2013-04-08 NOTE — Assessment & Plan Note (Signed)
The most common causes of chronic cough in immunocompetent adults include the following: upper airway cough syndrome (UACS), previously referred to as postnasal drip syndrome (PNDS), which is caused by variety of rhinosinus conditions; (2) asthma; (3) GERD; (4) chronic bronchitis from cigarette smoking or other inhaled environmental irritants; (5) nonasthmatic eosinophilic bronchitis; and (6) bronchiectasis.   These conditions, singly or in combination, have accounted for up to 94% of the causes of chronic cough in prospective studies.   Other conditions have constituted no >6% of the causes in prospective studies These have included bronchogenic carcinoma, chronic interstitial pneumonia, sarcoidosis, left ventricular failure, ACEI-induced cough, and aspiration from a condition associated with pharyngeal dysfunction.    Chronic cough is often simultaneously caused by more than one condition. A single cause has been found from 38 to 82% of the time, multiple causes from 18 to 62%. Multiply caused cough has been the result of three diseases up to 42% of the time.       Based on hx and exam, this is most likely:  Classic Upper airway cough syndrome, so named because it's frequently impossible to sort out how much is  CR/sinusitis with freq throat clearing (which can be related to primary GERD)   vs  causing  secondary (" extra esophageal")  GERD from wide swings in gastric pressure that occur with throat clearing, often  promoting self use of mint and menthol lozenges that reduce the lower esophageal sphincter tone and exacerbate the problem further in a cyclical fashion.   These are the same pts (now being labeled as having "irritable larynx syndrome" by some cough centers) who not infrequently have a history of having failed to tolerate ace inhibitors,  dry powder inhalers or biphosphonates or report having atypical reflux symptoms that don't respond to standard doses of PPI , and are easily confused as  having aecopd or asthma flares by even experienced allergists/ pulmonologists.   The first step is to maximize acid suppression and eliminate cyclical coughing while rx x 10 d with augmentin then ? Repeat sinus ct vs extend t 21 d if not better.   See instructions for specific recommendations which were reviewed directly with the patient who was given a copy with highlighter outlining the key components.

## 2013-04-08 NOTE — Patient Instructions (Addendum)
Prednisone 10 mg take  4 each am x 2 days,   2 each am x 2 days,  1 each am x 2 days and stop   The key to effective treatment for your cough is eliminating the non-stop cycle of cough you're stuck in long enough to let your airway heal completely and then see if there is anything still making you cough once you stop the cough suppression, but this should take no more than 5 days to figure out  First take delsym two tsp every 12 hours and supplement if needed with  tramadol 50 mg up to 2 every 4 hours to suppress the urge to cough at all or even clear your throat. Swallowing water or using ice chips/non mint and menthol containing candies (such as lifesavers or sugarless jolly ranchers) are also effective.  You should rest your voice and avoid activities that you know make you cough.  Once you have eliminated the cough for 3 straight days try reducing the tramadol first,  then the delsym as tolerated.    Try prilosec 20mg   Take 30-60 min before first meal of the day and Zantac 150  mg one bedtime and chlortrimeton 4 mg take 2 at bedtime until cough is completely gone for at least a week without the need for cough suppression    GERD (REFLUX)  is an extremely common cause of respiratory symptoms, many times with no significant heartburn at all.    It can be treated with medication, but also with lifestyle changes including avoidance of late meals, excessive alcohol, smoking cessation, and avoid fatty foods, chocolate, peppermint, colas, red wine, and acidic juices such as orange juice.  NO MINT OR MENTHOL PRODUCTS SO NO COUGH DROPS  USE SUGARLESS CANDY INSTEAD (jolley ranchers or Stover's)  NO OIL BASED VITAMINS - use powdered substitutes.  Please schedule a follow up office visit in 4 weeks, sooner if needed  Late add Augmentin 875 mg take one pill twice daily  X 10 days - take at breakfast and supper with large glass of water.  It would help reduce the usual side effects (diarrhea and yeast  infections) if you ate cultured yogurt at lunch. > f/u 2 weeks and hold chloretrimeton

## 2013-04-08 NOTE — Progress Notes (Signed)
Subjective:    Patient ID: Katrina Holland, female    DOB: 05-01-1960  MRN: [id number]  HPI  21 yowf quit smoking in 2009 with tendency to sev x yearly "croopy cough all her life" fine in between with no need for maint rx then summer  2013 more of a chronic daily sob/ cough then dx Dec 2013 breast rx with chemo starting March 2014 and RT completed Nov 2014 with a steady cough and sob since then eval by [name] with muiltiple tests inconclusive for cause of sob and no rx for cough.  04/08/2013 1st [place] [facility] office visit/ [place]  Chief Complaint  Patient presents with  . Advice Only    Self referred for URI.  Pt states it started as sinus congestion, has since moved to chest.  Wheezing, sometimes prod cough with light yellow mucous X3 weeks.     acutely rx with zpak/ steroids/assoc new hoarseness getting some better but approaching baseline cough and sob, assoc with sensation of  Nose runs all day, not all night - but cough worst first thing in am typically non purulent. Sob x 50 ft even if not coughing   No obvious other patterns in day to day or daytime variabilty or assoc   cp or chest tightness, subjective wheeze overt sinus or hb symptoms. No unusual exp hx or h/o childhood pna/ asthma or knowledge of premature birth.  Sleeping ok without nocturnal  or early am exacerbation  of respiratory  c/o's or need for noct saba. Also denies any obvious fluctuation of symptoms with weather or environmental changes or other aggravating or alleviating factors except as outlined above   Current Medications, Allergies, Complete Past Medical History, Past Surgical History, Family History, and Social History were reviewed in [employer] record.             Review of Systems  Constitutional: Negative for fever and unexpected weight change.  HENT: Positive for congestion, postnasal drip, sinus pressure and sore throat. Negative for dental problem, ear pain, nosebleeds,  rhinorrhea, sneezing and trouble swallowing.   Eyes: Negative for redness and itching.  Respiratory: Positive for cough, shortness of breath and wheezing. Negative for chest tightness.   Cardiovascular: Negative for palpitations and leg swelling.  Gastrointestinal: Negative for nausea and vomiting.  Genitourinary: Negative for dysuria.  Musculoskeletal: Negative for joint swelling.  Skin: Negative for rash.  Neurological: Negative for headaches.  Hematological: Does not bruise/bleed easily.  Psychiatric/Behavioral: Negative for dysphoric mood. The patient is not nervous/anxious.        Objective:   Physical Exam  amb very hoarse wf   Wt Readings from Last 3 Encounters:  04/08/13 210 lb (95.255 kg)  12/26/12 202 lb (91.627 kg)  06/27/12 191 lb (86.637 kg)      HEENT: nl dentition, turbinates, and orophanx. Nl external ear canals without cough reflex   NECK :  without JVD/Nodes/TM/ nl carotid upstrokes bilaterally   LUNGS: no acc muscle use, clear to A and P bilaterally without cough on insp or exp maneuvers   CV:  RRR  no s3 or murmur or increase in P2, no edema   ABD:  soft and nontender with nl excursion in the supine position. No bruits or organomegaly, bowel sounds nl  MS:  warm without deformities, calf tenderness, cyanosis or clubbing  SKIN: warm and dry without lesions    NEURO:  alert, approp, no deficits     CXR 04/01/13  nad  Assessment & Plan:

## 2013-04-10 ENCOUNTER — Ambulatory Visit: Payer: Self-pay | Admitting: Internal Medicine

## 2013-04-10 ENCOUNTER — Ambulatory Visit: Payer: Self-pay | Admitting: Radiation Oncology

## 2013-04-10 LAB — CREATININE, SERUM
Creatinine: 0.87 mg/dL (ref 0.60–1.30)
EGFR (African American): >60
EGFR (Non-African Amer.): >60

## 2013-04-10 LAB — POTASSIUM: Potassium: 3.6 mmol/L (ref 3.5–5.1)

## 2013-04-22 ENCOUNTER — Encounter: Payer: Self-pay | Admitting: Internal Medicine

## 2013-04-22 ENCOUNTER — Other Ambulatory Visit (INDEPENDENT_AMBULATORY_CARE_PROVIDER_SITE_OTHER): Payer: Federal, State, Local not specified - PPO

## 2013-04-22 ENCOUNTER — Ambulatory Visit (INDEPENDENT_AMBULATORY_CARE_PROVIDER_SITE_OTHER): Payer: Federal, State, Local not specified - PPO | Admitting: Internal Medicine

## 2013-04-22 VITALS — BP 90/60 | HR 80 | Temp 97.9°F | Ht 62.0 in | Wt 205.0 lb

## 2013-04-22 DIAGNOSIS — R05 Cough: Secondary | ICD-10-CM

## 2013-04-22 DIAGNOSIS — R058 Other specified cough: Secondary | ICD-10-CM

## 2013-04-22 DIAGNOSIS — R059 Cough, unspecified: Secondary | ICD-10-CM

## 2013-04-22 LAB — CBC WITH DIFFERENTIAL/PLATELET
Basophils Absolute: 0 10*3/uL (ref 0.0–0.1)
Basophils Relative: 0.5 % (ref 0.0–3.0)
Eosinophils Absolute: 0.1 10*3/uL (ref 0.0–0.7)
Eosinophils Relative: 2.5 % (ref 0.0–5.0)
HCT: 37 % (ref 36.0–46.0)
Hemoglobin: 12.5 g/dL (ref 12.0–15.0)
Lymphocytes Relative: 33.8 % (ref 12.0–46.0)
Lymphs Abs: 1.8 10*3/uL (ref 0.7–4.0)
MCHC: 33.8 g/dL (ref 30.0–36.0)
MCV: 92.7 fl (ref 78.0–100.0)
Monocytes Absolute: 0.6 10*3/uL (ref 0.1–1.0)
Monocytes Relative: 11.1 % (ref 3.0–12.0)
Neutro Abs: 2.9 10*3/uL (ref 1.4–7.7)
Neutrophils Relative %: 52.1 % (ref 43.0–77.0)
Platelets: 255 10*3/uL (ref 150.0–400.0)
RBC: 3.99 Mil/uL (ref 3.87–5.11)
RDW: 12.8 % (ref 11.5–14.6)
WBC: 5.5 10*3/uL (ref 4.5–10.5)

## 2013-04-22 MED ORDER — MONTELUKAST SODIUM 10 MG PO TABS: ORAL_TABLET | ORAL | Status: DC

## 2013-04-22 MED ORDER — PREDNISONE 10 MG PO TABS: ORAL_TABLET | ORAL | Status: DC

## 2013-04-22 NOTE — Progress Notes (Addendum)
Subjective:    Patient ID: Katrina Holland, female    DOB: 20-May-1960  MRN: [id number]   Brief patient profile:  50 yowf quit smoking in 2009 with tendency to sev x yearly "croopy cough all her life" fine in between with no need for maint rx then summer  2013 more of a chronic daily sob/ cough then dx Dec 2013 breast ca rx with chemo starting March 2014 and RT completed Nov 2014 with a persistent cough and sob since then eval by [name] with muiltiple tests inconclusive for cause of sob and no rx for cough so self referred to pulmonary clinic 04/08/13.  04/08/2013 1st [facility] office visit/ [name]  Chief Complaint  Patient presents with  . Advice Only    Self referred for URI.  Pt states it started as sinus congestion, has since moved to chest.  Wheezing, sometimes prod cough with light yellow mucous X3 weeks.     acutely ill Nov 2014 first rx with zpak/ steroids/assoc new hoarseness getting some better but not yet approaching baseline cough and sob, assoc with sensation of  Nose runs all day, not all night - but cough worst first thing in am typically non purulent. Sob x 50 ft even if not coughing. rec Prednisone 10 mg take  4 each am x 2 days,   2 each am x 2 days,  1 each am x 2 days and stop  Cough suppression with tramadol  prilosec 20mg   Take 30-60 min before first meal of the day and Zantac 150  mg one bedtime and chlortrimeton 4 mg take 2 at bedtime until cough is completely gone for at least a week without the need for cough suppression GERD  Diet  Sinus CT  04/08/2013 > Air-fluid level within maxillary sinus bilaterally > rx with Augmentin 875 mg take one pill twice daily  X 10 days  > could not take due to n and v > changed to [facility]    04/22/2013 f/u ov/[name] re:  Chief Complaint  Patient presents with  . Follow-up    Cough some better, but not resolved. Clearing throat often. No new co's today.   mostly sob with coughing Nasal and ear congestions s change, no purulent  secretions    No obvious day to day or daytime variabilty or assoc  cp or chest tightness, subjective wheeze overt sinus or hb symptoms. No unusual exp hx or h/o childhood pna/ asthma or knowledge of premature birth.  Sleeping ok without nocturnal  or early am exacerbation  of respiratory  c/o's or need for noct saba. Also denies any obvious fluctuation of symptoms with weather or environmental changes or other aggravating or alleviating factors except as outlined above   Current Medications, Allergies, Complete Past Medical History, Past Surgical History, Family History, and Social History were reviewed in [employer] record.  ROS  The following are not active complaints unless bolded sore throat, dysphagia, dental problems, itching, sneezing,  nasal congestion or excess/ purulent secretions, ear ache,   fever, chills, sweats, unintended wt loss, pleuritic or exertional cp, hemoptysis,  orthopnea pnd or leg swelling, presyncope, palpitations, heartburn, abdominal pain, anorexia, nausea, vomiting, diarrhea  or change in bowel or urinary habits, change in stools or urine, dysuria,hematuria,  rash, arthralgias, visual complaints, headache, numbness weakness or ataxia or problems with walking or coordination,  change in mood/affect or memory.  Objective:   Physical Exam  amb very hoarse wf  nad  04/22/13        Wt 205   Wt Readings from Last 3 Encounters:  04/08/13 210 lb (95.255 kg)  12/26/12 202 lb (91.627 kg)  06/27/12 191 lb (86.637 kg)      HEENT: nl dentition, turbinates, and orophanx. Nl external ear canals without cough reflex   NECK :  without JVD/Nodes/TM/ nl carotid upstrokes bilaterally   LUNGS: no acc muscle use, clear to A and P bilaterally without cough on insp or exp maneuvers   CV:  RRR  no s3 or murmur or increase in P2, no edema   ABD:  soft and nontender with nl excursion in the supine position. No bruits or  organomegaly, bowel sounds nl  MS:  warm without deformities, calf tenderness, cyanosis or clubbing  SKIN: warm and dry without lesions    NEURO:  alert, approp, no deficits     CXR 04/01/13  nad      Assessment & Plan:

## 2013-04-22 NOTE — Patient Instructions (Addendum)
Start singulair 10 mg daily   Please see patient coordinator before you leave today  to schedule ent eval  Prednisone 10 mg take  4 each am x 2 days,   2 each am x 2 days,  1 each am x 2 days and stop  Please remember to go to the lab department downstairs for your tests - we will call you with the results when they are available.   Please schedule a follow up office visit in 6 weeks, call sooner if needed

## 2013-04-23 ENCOUNTER — Encounter: Payer: Self-pay | Admitting: Internal Medicine

## 2013-04-23 LAB — ALLERGY PROFILE REGION II-DC, DE, MD, ~~LOC~~, VA
Allergen, D pternoyssinus,d7: <0.1 kU/L
Alternaria Alternata: <0.1 kU/L
Aspergillus fumigatus, m3: <0.1 kU/L
Bermuda Grass: <0.1 kU/L
Box Elder IgE: <0.1 kU/L
Cat Dander: <0.1 kU/L
Cladosporium Herbarum: <0.1 kU/L
Cockroach: <0.1 kU/L
Common Ragweed: <0.1 kU/L
D. farinae: <0.1 kU/L
Dog Dander: <0.1 kU/L
Elm IgE: <0.1 kU/L
IgE (Immunoglobulin E), Serum: <1.5 IU/mL (ref 0.0–180.0)
Johnson Grass: <0.1 kU/L
Lamb's Quarters: <0.1 kU/L
Meadow Grass: <0.1 kU/L
Oak: <0.1 kU/L
Pecan/Hickory Tree IgE: <0.1 kU/L

## 2013-04-23 NOTE — Assessment & Plan Note (Addendum)
-   sinus CT  04/08/2013 > Air-fluid level within maxillary sinus bilaterally > rx omnicef  - allergy profile 04/22/2013  >>>  She is clearly better than she's been in months but discouraged that she's not completely baseline and has a nl chest exam/ nl cxr and previous nl w/u by Dr [name] so I suspect this is all upper airway in nature    still having lots of nasal congestion but clear drainage p abx > try singulair and rx x 6 days with prednisone /   and refer to ENT  Pulmonary f/u p ent eval complete

## 2013-04-23 NOTE — Progress Notes (Signed)
Quick Note:  LMTCB ______ 

## 2013-04-24 NOTE — Progress Notes (Signed)
Quick Note:  LMTCB ______ 

## 2013-04-25 ENCOUNTER — Telehealth: Payer: Self-pay | Admitting: Internal Medicine

## 2013-04-25 NOTE — Telephone Encounter (Signed)
Notes Recorded by Tanda Rockers, MD on 04/23/2013 at 1:14 PM Call patient : Studies are unremarkable, no change in recs (no resp allergies identified)   I spoke with patient about results and she verbalized understanding and had no questions

## 2013-05-01 LAB — BASIC METABOLIC PANEL
Anion Gap: 7 (ref 7–16)
BUN: 14 mg/dL (ref 7–18)
Calcium, Total: 9.6 mg/dL (ref 8.5–10.1)
Chloride: 104 mmol/L (ref 98–107)
Co2: 30 mmol/L (ref 21–32)
Creatinine: 1.08 mg/dL (ref 0.60–1.30)
EGFR (African American): >60
EGFR (Non-African Amer.): 59 — ABNORMAL LOW
Glucose: 97 mg/dL (ref 65–99)
Osmolality: 282 (ref 275–301)
Potassium: 3.8 mmol/L (ref 3.5–5.1)
Sodium: 141 mmol/L (ref 136–145)

## 2013-05-01 LAB — CBC CANCER CENTER
Basophil #: 0 x10 3/mm (ref 0.0–0.1)
Basophil %: 0.6 %
Eosinophil #: 0.2 x10 3/mm (ref 0.0–0.7)
Eosinophil %: 2.7 %
HCT: 37.5 % (ref 35.0–47.0)
HGB: 12.1 g/dL (ref 12.0–16.0)
Lymphocyte #: 2.3 x10 3/mm (ref 1.0–3.6)
Lymphocyte %: 35.8 %
MCH: 30.2 pg (ref 26.0–34.0)
MCHC: 32.4 g/dL (ref 32.0–36.0)
MCV: 93 fL (ref 80–100)
Monocyte #: 0.5 x10 3/mm (ref 0.2–0.9)
Monocyte %: 8.2 %
Neutrophil #: 3.4 x10 3/mm (ref 1.4–6.5)
Neutrophil %: 52.7 %
Platelet: 331 x10 3/mm (ref 150–440)
RBC: 4.02 10*6/uL (ref 3.80–5.20)
RDW: 12.7 % (ref 11.5–14.5)
WBC: 6.5 x10 3/mm (ref 3.6–11.0)

## 2013-05-01 LAB — HEPATIC FUNCTION PANEL A (ARMC)
Albumin: 3.6 g/dL (ref 3.4–5.0)
Alkaline Phosphatase: 142 U/L — ABNORMAL HIGH
Bilirubin, Direct: 0.1 mg/dL (ref 0.00–0.20)
Bilirubin,Total: 0.3 mg/dL (ref 0.2–1.0)
SGOT(AST): 16 U/L (ref 15–37)
SGPT (ALT): 29 U/L (ref 12–78)
Total Protein: 6.7 g/dL (ref 6.4–8.2)

## 2013-05-03 ENCOUNTER — Ambulatory Visit: Payer: Self-pay | Admitting: Internal Medicine

## 2013-05-10 ENCOUNTER — Ambulatory Visit: Payer: Self-pay | Admitting: Radiation Oncology

## 2013-05-10 ENCOUNTER — Ambulatory Visit: Payer: Self-pay | Admitting: Internal Medicine

## 2013-06-10 ENCOUNTER — Ambulatory Visit: Payer: Self-pay | Admitting: Radiation Oncology

## 2013-07-05 ENCOUNTER — Encounter: Payer: Self-pay | Admitting: General Surgery

## 2013-07-10 ENCOUNTER — Ambulatory Visit: Payer: Self-pay | Admitting: Internal Medicine

## 2013-07-11 ENCOUNTER — Encounter: Payer: Self-pay | Admitting: General Surgery

## 2013-07-11 ENCOUNTER — Ambulatory Visit (INDEPENDENT_AMBULATORY_CARE_PROVIDER_SITE_OTHER): Payer: Federal, State, Local not specified - PPO | Admitting: General Surgery

## 2013-07-11 VITALS — BP 122/74 | HR 76 | Resp 12 | Ht 62.0 in | Wt 197.0 lb

## 2013-07-11 DIAGNOSIS — Z853 Personal history of malignant neoplasm of breast: Secondary | ICD-10-CM

## 2013-07-11 NOTE — Patient Instructions (Signed)
Continue self breast exams. Call office for any new breast issues or concerns.Patient to return in six moths left breast diagnotic mammogram.

## 2013-07-11 NOTE — Progress Notes (Signed)
Patient ID: Katrina Holland, female   DOB: 14-Dec-1960, 53 y.o.   MRN: [id number]  Chief Complaint  Patient presents with  . Follow-up    mammogram    HPI Katrina Holland is a 53 y.o. female who presents for a breast evaluation. The most recent mammogram was done on 07/02/13.Patient states she has been having pain in her left upper outer quadrant been going on for six months now. She states the left  breast is alway throbbing . Patient does perform regular self breast checks and gets regular mammograms done.   The patient is being seen regularly by Leia Alf, M.D. from medical oncology.  She reports that since her last visit she underwent a facelift as well as a right breast mastopexy under the care of Nicholaus Bloom, M.D.  HPI  Past Medical History  Diagnosis Date  . Breast mass   . Anxiety   . GERD (gastroesophageal reflux disease)   . Pulmonary hypertension 2013  . Irritable bowel syndrome   . Endometriosis 2001  . Sleep apnea 2007  . 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    Past Surgical History  Procedure Laterality Date  . Breast enhancement surgery  2014,05/2013  . Port a cath revision  2014  . Dilation and curettage of uterus  2011  . Ovary surgery Right 2001  . Nissen fundoplication  3338  . Tonsillectomy  1969  . Hernia repair  1997  . Colonoscopy  2012  . Right heart cath  2013  . Exploratory laparotomy  2001  . Facial cosmetic surgery  2015  . Breast surgery Left 01/30/2012    Wide excision, sentinel node biopsy  . Breast surgery Left 02/09/2012    Left breast reduction/reconstruction: Nicholaus Bloom, MD.  . Breast surgery Left December 26, 2012    ultrasound-guided biopsy, post surgical change.    Family History  Problem Relation Age of Onset  . Colon cancer Paternal Grandfather     Social History History  Substance Use  Topics  . Smoking status: Former Smoker -- 0.10 packs/day for 25 years    Types: Cigarettes    Quit date: 01/11/2007  . Smokeless tobacco: Never Used     Comment: "social smoker" per pt.  . Alcohol Use: No    Allergies  Allergen Reactions  . Augmentin [Amoxicillin-Pot Clavulanate]     "makes me sick"    Current Outpatient Prescriptions  Medication Sig Dispense Refill  . diclofenac (VOLTAREN) 75 MG EC tablet Take 1 tablet by mouth 2 (two) times daily.      Marland Kitchen exemestane (AROMASIN) 25 MG tablet Take 25 mg by mouth daily after breakfast.      . fexofenadine (ALLEGRA) 180 MG tablet Take 180 mg by mouth daily.      Marland Kitchen gabapentin (NEURONTIN) 100 MG capsule Take 200 mg by mouth 3 (three) times daily.      . montelukast (SINGULAIR) 10 MG tablet One at bedtime every night  30 tablet  2  . pantoprazole (PROTONIX) 40 MG tablet Take 1 tablet (40 mg total) by mouth daily. Take 30-60 min before first meal of the day  30 tablet  2  . predniSONE (DELTASONE) 10 MG tablet Take  4 each am x 2 days,   2 each am x 2 days,  1 each am x 2 days and stop  14 tablet  0  . ranitidine (ZANTAC) 150 MG tablet One at bedtime  30 tablet  2  . sertraline (ZOLOFT) 100 MG tablet Take 100 mg by mouth daily.      . traMADol (ULTRAM) 50 MG tablet 1-2 every 4 hours as needed for cough or pain  40 tablet  0   No current facility-administered medications for this visit.    Review of Systems Review of Systems  Constitutional: Negative.   Respiratory: Negative.   Cardiovascular: Negative.     Blood pressure 122/74, pulse 76, resp. rate 12, height '5\' 2"'  (1.575 m), weight 197 lb (89.359 kg).  Physical Exam Physical Exam  Constitutional: She is oriented to person, place, and time. She appears well-developed and well-nourished.  Eyes: Conjunctivae are normal. No scleral icterus.  Neck: Neck supple.  Cardiovascular: Normal rate, regular rhythm and normal heart sounds.   Pulmonary/Chest: Effort normal and breath sounds  normal. Right breast exhibits no inverted nipple, no mass, no nipple discharge, no skin change and no tenderness. Left breast exhibits no inverted nipple, no mass, no nipple discharge, no skin change and no tenderness.    Left breast   Abdominal: Soft. Bowel sounds are normal. There is no tenderness.  Lymphadenopathy:    She has no cervical adenopathy.    She has no axillary adenopathy.  Neurological: She is alert and oriented to person, place, and time.  Skin: Skin is warm and dry.    Data Reviewed Bilateral mammograms dated July 04, 2013 completed at [facility] were reviewed. Postsurgical changes noted in the breast. Minimal right breast distortion status post mastopexy. BI-RAD-2.  Assessment    No evidence of recurrent disease. Persistent left breast discomfort status post wide excision, radiation and biopsy.     Plan    The patient was encouraged to make use of a trial of local heat to the area to remove the throbbing sensation described today.  The patient is uneasy with a one year followup is recommended by the radiologist. Considering her need for interval biopsy is reasonable to complete a left breast diagnostic mammogram in 6 months.        PCP: Dear, Sonia Baller, Forest Gleason 07/11/2013, 8:13 PM   `

## 2013-07-22 ENCOUNTER — Other Ambulatory Visit: Payer: Self-pay | Admitting: Internal Medicine

## 2013-08-14 ENCOUNTER — Ambulatory Visit: Payer: Self-pay | Admitting: Internal Medicine

## 2013-08-26 ENCOUNTER — Other Ambulatory Visit: Payer: Self-pay | Admitting: Internal Medicine

## 2013-09-10 ENCOUNTER — Ambulatory Visit: Payer: Self-pay | Admitting: Internal Medicine

## 2013-09-25 LAB — HEPATIC FUNCTION PANEL A (ARMC)
Albumin: 3.9 g/dL (ref 3.4–5.0)
Alkaline Phosphatase: 154 U/L — ABNORMAL HIGH
Bilirubin, Direct: 0.1 mg/dL (ref 0.00–0.20)
Bilirubin,Total: 0.4 mg/dL (ref 0.2–1.0)
SGOT(AST): 30 U/L (ref 15–37)
SGPT (ALT): 55 U/L
Total Protein: 7.2 g/dL (ref 6.4–8.2)

## 2013-09-25 LAB — BASIC METABOLIC PANEL
Anion Gap: 8 (ref 7–16)
BUN: 17 mg/dL (ref 7–18)
Calcium, Total: 10.1 mg/dL (ref 8.5–10.1)
Chloride: 103 mmol/L (ref 98–107)
Co2: 29 mmol/L (ref 21–32)
Creatinine: 0.87 mg/dL (ref 0.60–1.30)
EGFR (African American): >60
EGFR (Non-African Amer.): >60
Glucose: 92 mg/dL (ref 65–99)
Osmolality: 281 (ref 275–301)
Potassium: 4 mmol/L (ref 3.5–5.1)
Sodium: 140 mmol/L (ref 136–145)

## 2013-09-25 LAB — CBC CANCER CENTER
Basophil #: 0 x10 3/mm (ref 0.0–0.1)
Basophil %: 0.4 %
Eosinophil #: 0.1 x10 3/mm (ref 0.0–0.7)
Eosinophil %: 0.9 %
HCT: 41 % (ref 35.0–47.0)
HGB: 13.7 g/dL (ref 12.0–16.0)
Lymphocyte #: 2.2 x10 3/mm (ref 1.0–3.6)
Lymphocyte %: 32.4 %
MCH: 31.6 pg (ref 26.0–34.0)
MCHC: 33.4 g/dL (ref 32.0–36.0)
MCV: 95 fL (ref 80–100)
Monocyte #: 0.4 x10 3/mm (ref 0.2–0.9)
Monocyte %: 6.4 %
Neutrophil #: 4.1 x10 3/mm (ref 1.4–6.5)
Neutrophil %: 59.9 %
Platelet: 276 x10 3/mm (ref 150–440)
RBC: 4.34 10*6/uL (ref 3.80–5.20)
RDW: 12.2 % (ref 11.5–14.5)
WBC: 6.9 x10 3/mm (ref 3.6–11.0)

## 2013-10-09 DIAGNOSIS — M5136 Other intervertebral disc degeneration, lumbar region: Secondary | ICD-10-CM | POA: Insufficient documentation

## 2013-10-09 DIAGNOSIS — M5417 Radiculopathy, lumbosacral region: Secondary | ICD-10-CM | POA: Insufficient documentation

## 2013-10-10 ENCOUNTER — Ambulatory Visit: Payer: Self-pay | Admitting: Internal Medicine

## 2013-11-10 ENCOUNTER — Ambulatory Visit: Payer: Self-pay | Admitting: Internal Medicine

## 2013-11-12 DIAGNOSIS — Z87898 Personal history of other specified conditions: Secondary | ICD-10-CM | POA: Insufficient documentation

## 2013-12-18 ENCOUNTER — Encounter: Payer: Self-pay | Admitting: General Surgery

## 2013-12-18 ENCOUNTER — Ambulatory Visit: Payer: Self-pay | Admitting: Physical Medicine and Rehabilitation

## 2013-12-18 ENCOUNTER — Ambulatory Visit: Payer: Self-pay | Admitting: Internal Medicine

## 2013-12-18 ENCOUNTER — Ambulatory Visit (INDEPENDENT_AMBULATORY_CARE_PROVIDER_SITE_OTHER): Payer: Federal, State, Local not specified - PPO | Admitting: General Surgery

## 2013-12-18 VITALS — BP 120/76 | HR 80 | Resp 14 | Ht 62.0 in | Wt 189.0 lb

## 2013-12-18 DIAGNOSIS — C50912 Malignant neoplasm of unspecified site of left female breast: Secondary | ICD-10-CM

## 2013-12-18 NOTE — Progress Notes (Signed)
Patient ID: Katrina Holland, female   DOB: Jun 06, 1960, 54 y.o.   MRN: [id number]  Chief Complaint  Patient presents with  . Follow-up    6 month left diagnostic mammogram    HPI Katrina Holland is a 53 y.o. female who presents for a breast cancer evaluation. The most recent mammogram was done on 12/10/13. Patient does not perform regular self breast checks but does get regular mammograms done.  She states approximately 2 weeks ago she noticed some soreness on her left side in between where her bra rests. She says that it's like a bruised sensation but she can not pinpoint a time she has injured herself. Otherwise doing well. She is tolerating the Tamoxifen reasonably well. She was unable to tolerate aromatase inhibitors.  The patient raised some concerns about the risks of uterine cancer. Her last menstrual period was in 2001 (she reports premature ovarian failure). She was reassured that should she develop uterine proliferation, she would likely see vaginal spotting well before any malignancy developed.   HPI  Past Medical History  Diagnosis Date  . Breast mass   . Anxiety   . GERD (gastroesophageal reflux disease)   . Pulmonary hypertension 2013  . Irritable bowel syndrome   . Endometriosis 2001  . Sleep apnea 2007  . 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    Past Surgical History  Procedure Laterality Date  . Breast enhancement surgery  2014,05/2013  . Port a cath revision  2014  . Dilation and curettage of uterus  2011  . Ovary surgery Right 2001  . Nissen fundoplication  3716  . Tonsillectomy  1969  . Hernia repair  1997  . Colonoscopy  2012  . Right heart cath  2013  . Exploratory laparotomy  2001  . Facial cosmetic surgery  2015  . Breast surgery Left 01/30/2012    Wide excision, sentinel node biopsy  . Breast surgery Left  02/09/2012    Left breast reduction/reconstruction: Nicholaus Bloom, MD.  . Breast surgery Left December 26, 2012    ultrasound-guided biopsy, post surgical change.    Family History  Problem Relation Age of Onset  . Colon cancer Paternal Grandfather     Social History History  Substance Use Topics  . Smoking status: Former Smoker -- 0.10 packs/day for 25 years    Types: Cigarettes    Quit date: 01/11/2007  . Smokeless tobacco: Never Used     Comment: "social smoker" per pt.  . Alcohol Use: No    Allergies  Allergen Reactions  . Augmentin [Amoxicillin-Pot Clavulanate]     "makes me sick"    Current Outpatient Prescriptions  Medication Sig Dispense Refill  . diclofenac (VOLTAREN) 75 MG EC tablet Take 1 tablet by mouth 2 (two) times daily.    Marland Kitchen gabapentin (NEURONTIN) 100 MG capsule Take 200 mg by mouth 3 (three) times daily.    . montelukast (SINGULAIR) 10 MG tablet TAKE 1 TABLET BY MOUTH EVERY NIGHT AT BEDTIME 30 tablet 3  . phentermine (ADIPEX-P) 37.5 MG tablet Take 1 tablet by mouth daily.    . sertraline (ZOLOFT) 100 MG tablet Take 100 mg by mouth daily.    . simvastatin (ZOCOR) 20 MG tablet Take 1 tablet by mouth daily.    . tamoxifen (NOLVADEX) 20 MG tablet Take 1 tablet by mouth daily.  No current facility-administered medications for this visit.    Review of Systems Review of Systems  Constitutional: Negative.   Respiratory: Negative.   Cardiovascular: Negative.     Blood pressure 120/76, pulse 80, resp. rate 14, height _0  (1.575 m), weight 189 lb (85.73 kg).  Physical Exam Physical Exam  Constitutional: She is oriented to person, place, and time. She appears well-developed and well-nourished.  Neck: Neck supple. No thyromegaly present.  Cardiovascular: Normal rate, regular rhythm and normal heart sounds.   No murmur heard. Pulmonary/Chest: Effort normal and breath sounds normal. Right breast exhibits no inverted nipple, no mass, no nipple discharge, no  skin change and no tenderness. Left breast exhibits no inverted nipple, no mass, no nipple discharge, no skin change and no tenderness.    Lymphadenopathy:    She has no cervical adenopathy.    She has no axillary adenopathy.  Neurological: She is alert and oriented to person, place, and time.  Skin: Skin is warm and dry.    Data Reviewed Left breast mammogram completed at [facility] dated 12/10/2013 was reviewed and compared to previous studies. Postsurgical changes. BI-RADS-2.  Assessment    Doing well status post management of left breast cancer.    Plan    We'll plan for a follow-up examination in 6 months with bilateral mammograms at that time.       Robert Bellow 12/18/2013, 7:21 PM

## 2013-12-18 NOTE — Patient Instructions (Signed)
Patient to return in 6 months with bilateral diagnostic mammogram. Continue self breast exams. Call office for any new breast issues or concerns.

## 2013-12-23 ENCOUNTER — Encounter: Payer: Self-pay | Admitting: General Surgery

## 2013-12-28 ENCOUNTER — Other Ambulatory Visit: Payer: Self-pay | Admitting: Internal Medicine

## 2014-01-01 ENCOUNTER — Ambulatory Visit: Payer: Federal, State, Local not specified - PPO | Admitting: General Surgery

## 2014-01-09 ENCOUNTER — Ambulatory Visit (INDEPENDENT_AMBULATORY_CARE_PROVIDER_SITE_OTHER): Payer: Federal, State, Local not specified - PPO | Admitting: General Surgery

## 2014-01-09 VITALS — BP 130/74 | HR 74 | Resp 14 | Ht 62.0 in | Wt 189.0 lb

## 2014-01-09 DIAGNOSIS — C50912 Malignant neoplasm of unspecified site of left female breast: Secondary | ICD-10-CM

## 2014-01-09 NOTE — Progress Notes (Signed)
Patient ID: Katrina Holland, female   DOB: 1960-05-17, 53 y.o.   MRN: [id number]  Chief Complaint  Patient presents with  . Procedure    port removal    HPI Katrina Holland is a 53 y.o. female here today for a port removal.  HPI  Past Medical History  Diagnosis Date  . Breast mass   . Anxiety   . GERD (gastroesophageal reflux disease)   . Pulmonary hypertension 2013  . Irritable bowel syndrome   . Endometriosis 2001  . Sleep apnea 2007  . 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    Past Surgical History  Procedure Laterality Date  . Breast enhancement surgery  2014,05/2013  . Port a cath revision  2014  . Dilation and curettage of uterus  2011  . Ovary surgery Right 2001  . Nissen fundoplication  3254  . Tonsillectomy  1969  . Hernia repair  1997  . Colonoscopy  2012  . Right heart cath  2013  . Exploratory laparotomy  2001  . Facial cosmetic surgery  2015  . Breast surgery Left 01/30/2012    Wide excision, sentinel node biopsy  . Breast surgery Left 02/09/2012    Left breast reduction/reconstruction: Nicholaus Bloom, MD.  . Breast surgery Left December 26, 2012    ultrasound-guided biopsy, post surgical change.    Family History  Problem Relation Age of Onset  . Colon cancer Paternal Grandfather     Social History History  Substance Use Topics  . Smoking status: Former Smoker -- 0.10 packs/day for 25 years    Types: Cigarettes    Quit date: 01/11/2007  . Smokeless tobacco: Never Used     Comment: "social smoker" per pt.  . Alcohol Use: No    Allergies  Allergen Reactions  . Augmentin [Amoxicillin-Pot Clavulanate]     "makes me sick"    Current Outpatient Prescriptions  Medication Sig Dispense Refill  . diclofenac (VOLTAREN) 75 MG EC tablet Take 1 tablet by mouth 2 (two) times daily.    Marland Kitchen gabapentin (NEURONTIN)  100 MG capsule Take 200 mg by mouth 3 (three) times daily.    . montelukast (SINGULAIR) 10 MG tablet TAKE 1 TABLET BY MOUTH EVERY NIGHT AT BEDTIME 30 tablet 3  . phentermine (ADIPEX-P) 37.5 MG tablet Take 1 tablet by mouth daily.    . sertraline (ZOLOFT) 100 MG tablet Take 100 mg by mouth daily.    . simvastatin (ZOCOR) 20 MG tablet Take 1 tablet by mouth daily.    . tamoxifen (NOLVADEX) 20 MG tablet Take 1 tablet by mouth daily.     No current facility-administered medications for this visit.    Review of Systems Review of Systems  Constitutional: Negative.   Respiratory: Negative.   Cardiovascular: Negative.     Blood pressure 130/74, pulse 74, resp. rate 14, height '5\' 2"'  (1.575 m), weight 189 lb (85.73 kg).  Physical Exam Physical Exam Examination of the right chest wall shows no evidence of inflammation or swelling at the port site.  Data Reviewed Request for port removal has been obtained.  Assessment    Candidate for PowerPort removal.    Plan    The area was prepped with alcohol and 10 mL of 0.5% Xylocaine with 0.25% Marcaine with 1-200,000 units of epinephrine was utilized well tolerated. ChloraPrep was applied to the skin. The  previous incision was opened sharply. No bleeding was noted. The port was exposed in the 3. transfixion sutures were removed. The port was extracted with an intact tip. No bleeding was noted. The wound was closed in layers with a 3-0 Vicryl figure-of-eight suture to the adipose layer and a running 3-0 Vicryl septic suture for the skin. Benzoin and Steri-Strips followed by Telfa and Tegaderm dressing were applied. The procedure was well tolerated.  The patient will return in one week for wound check with the staff.       Robert Bellow 01/09/2014, 2:24 PM

## 2014-01-09 NOTE — Patient Instructions (Signed)
Patient to return in 1 week for nurse visit. The patient is aware to call back for any questions or concerns.

## 2014-01-10 ENCOUNTER — Ambulatory Visit: Payer: Self-pay | Admitting: Internal Medicine

## 2014-01-15 ENCOUNTER — Ambulatory Visit (INDEPENDENT_AMBULATORY_CARE_PROVIDER_SITE_OTHER): Payer: Self-pay | Admitting: *Deleted

## 2014-01-15 DIAGNOSIS — C50912 Malignant neoplasm of unspecified site of left female breast: Secondary | ICD-10-CM

## 2014-01-15 NOTE — Patient Instructions (Signed)
As scheduled

## 2014-01-15 NOTE — Progress Notes (Signed)
Patient came in today for a wound check/port removal.  The wound is clean, with no signs of infection noted. Minimal bruising noted. Steri strips off, one added at pt request. Follow up as scheduled.

## 2014-01-22 DIAGNOSIS — M7062 Trochanteric bursitis, left hip: Secondary | ICD-10-CM | POA: Insufficient documentation

## 2014-04-23 ENCOUNTER — Encounter: Payer: Self-pay | Admitting: General Surgery

## 2014-04-23 ENCOUNTER — Ambulatory Visit (INDEPENDENT_AMBULATORY_CARE_PROVIDER_SITE_OTHER): Payer: Federal, State, Local not specified - PPO | Admitting: General Surgery

## 2014-04-23 ENCOUNTER — Ambulatory Visit: Payer: Federal, State, Local not specified - PPO

## 2014-04-23 VITALS — BP 118/76 | HR 80 | Resp 12 | Ht 62.0 in | Wt 180.0 lb

## 2014-04-23 DIAGNOSIS — C50912 Malignant neoplasm of unspecified site of left female breast: Secondary | ICD-10-CM

## 2014-04-23 DIAGNOSIS — M79622 Pain in left upper arm: Secondary | ICD-10-CM

## 2014-04-23 DIAGNOSIS — M25512 Pain in left shoulder: Secondary | ICD-10-CM

## 2014-04-23 NOTE — Progress Notes (Signed)
Patient ID: Katrina Holland, female   DOB: 1960-08-16, 54 y.o.   MRN: [id number]  Chief Complaint  Patient presents with  . Other    swelling in left axilla    HPI Katrina Holland is a 54 y.o. female.  Here today for evaluation of left axilla swelling and dull ache. She states it is just above the incision. She first noticed it about one month ago. Denies any injury or trauma. She states she felt better in January than she does now. She does admit to sleeping more for the past several weeks. She does admit to having sleep apnea since 2007.  She completed radiation November 2014 for left breast cancer.  HPI  Past Medical History  Diagnosis Date  . Breast mass   . Anxiety   . GERD (gastroesophageal reflux disease)   . Pulmonary hypertension 2013  . Irritable bowel syndrome   . Endometriosis 2001  . Sleep apnea 2007  . Sleep apnea 2007  . 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 Ma Hillock..    Past Surgical History  Procedure Laterality Date  . Breast enhancement surgery  2014,05/2013  . Port a cath revision  2014  . Dilation and curettage of uterus  2011  . Ovary surgery Right 2001  . Nissen fundoplication  2197  . Tonsillectomy  1969  . Hernia repair  1997  . Colonoscopy  2012  . Right heart cath  2013  . Exploratory laparotomy  2001  . Facial cosmetic surgery  2015  . Breast surgery Left 01/30/2012    Wide excision, sentinel node biopsy  . Breast surgery Left 02/09/2012    Left breast reduction/reconstruction: Nicholaus Bloom, MD.  . Breast surgery Left December 26, 2012    ultrasound-guided biopsy, post surgical change.    Family History  Problem Relation Age of Onset  . Colon cancer Paternal Grandfather     Social  History History  Substance Use Topics  . Smoking status: Former Smoker -- 0.10 packs/day for 25 years    Types: Cigarettes    Quit date: 01/11/2007  . Smokeless tobacco: Never Used     Comment: "social smoker" per pt.  . Alcohol Use: No    Allergies  Allergen Reactions  . Augmentin [Amoxicillin-Pot Clavulanate]     "makes me sick"    Current Outpatient Prescriptions  Medication Sig Dispense Refill  . lidocaine (XYLOCAINE) 2 % solution     . sertraline (ZOLOFT) 100 MG tablet Take 100 mg by mouth daily.    . simvastatin (ZOCOR) 20 MG tablet Take 1 tablet by mouth daily.    . tamoxifen (NOLVADEX) 20 MG tablet Take 1 tablet by mouth daily.     No current facility-administered medications for this visit.    Review of Systems Review of Systems  Constitutional: Positive for fatigue.  Respiratory: Negative.   Cardiovascular: Negative.     Blood pressure 118/76, pulse 80, resp. rate 12, height '5\' 2"'  (1.575 m), weight 180 lb (81.647 kg).  Physical Exam Physical Exam  Constitutional: She is oriented to person, place, and time. She appears well-developed and well-nourished.  Neck: Neck supple.  Cardiovascular: Normal rate, regular rhythm and normal heart sounds.   Pulmonary/Chest: Effort normal and breath sounds normal. Right breast exhibits no inverted nipple, no  mass, no nipple discharge, no skin change and no tenderness. Left breast exhibits no inverted nipple, no mass, no nipple discharge, no skin change and no tenderness.  Lymphadenopathy:    She has no cervical adenopathy.    She has no axillary adenopathy.  Neurological: She is alert and oriented to person, place, and time.  Skin: Skin is warm and dry.    Data Reviewed Ultrasound examination of the left breast was completed. There is evidence of a small seroma in the area along the scar in the 2:00 position 9 cm from the nipple. This measures less than 2 cm in length and less than 7 mm in diameter. This is likely related  to the mastopexy completed the time of her contralateral breast surgery. No other cystic or solid lesions or evidence of architectural distortion is appreciated. Scanning to the axilla was unremarkable.  Assessment    Benign breast exam.    Plan    The patient was reassured. We'll proceed with planned follow-up later this spring with repeat mammograms at that time.     Follow up in June 2016 as scheduled.   Continue self breast exams. Call office for any new breast issues or concerns.   PCP:  No Pcp   Robert Bellow 04/26/2014, 10:08 AM

## 2014-04-23 NOTE — Patient Instructions (Signed)
The patient is aware to call back for any questions or concerns. Continue self breast exams. Call office for any new breast issues or concerns. 

## 2014-04-26 ENCOUNTER — Encounter: Payer: Self-pay | Admitting: General Surgery

## 2014-04-26 DIAGNOSIS — M79629 Pain in unspecified upper arm: Secondary | ICD-10-CM | POA: Insufficient documentation

## 2014-05-02 NOTE — Op Note (Signed)
PATIENT NAME:  Katrina Holland, Katrina Holland MR#:  [id number] DATE OF BIRTH:  Aug 16, 1960  DATE OF OPERATION:  03/13/2012  PREOPERATIVE DIAGNOSIS: Left breast cancer, need for central venous access.   POSTOPERATIVE  DIAGNOSIS: Left breast cancer, need for central venous access.   OPERATIVE PROCEDURE: Right subclavian PowerPort placement with ultrasound guidance.   OPERATING SURGEON: Hervey Ard, MD  ANESTHESIA: Attended local, 15 mL of 1% plain Xylocaine.   ESTIMATED BLOOD LOSS: Minimal.   CLINICAL NOTE: This 54 year old woman had left breast cancer and is felt to be a candidate for adjuvant chemotherapy. Central venous access was requested by the treating oncologist.   OPERATIVE NOTE: With the patient comfortably supine on the table and having previously received Kefzol intravenously, the chest was prepped with ChloraPrep and draped. Under ultrasound guidance, the subclavian vein on the right side was noted to be patent and was cannulated on a single stick. The guidewire was advanced and confirmation of location in the SVC was obtained with fluoroscopy. The tract was dilated and the PowerPort cannula placed. This was pulled back into the junction of the right atrium and superior vena cava.   The cannula was then tunneled to the right anterior chest, where a pocket was made for the port placement. The port was anchored to the deep fascia with 3-point fixation of 3-0 Prolene. After placement, the catheter easily irrigated and aspirated with the patient in the supine position. The adipose layer was closed with a running 3-0 Vicryl, and the skin closed with a running 4-0 Vicryl subcuticular suture. Benzoin, Steri-Strips, Telfa and Tegaderm dressing were applied.   An erect chest x-ray obtained in the recovery room showed no evidence of pneumothorax and the catheter tip as described above.   The patient tolerated the procedure well.    ____________________________ Robert Bellow,  MD jwb:dm D: 03/13/2012 10:03:00 ET T: 03/13/2012 10:38:40 ET JOB#: [id number]  cc: Trevor Iha R. Ma Hillock, MD Cleda Daub, MD Robert Bellow, MD, <Dictator>      JEFFREY Amedeo Kinsman MD ELECTRONICALLY SIGNED 03/13/2012 19:05

## 2014-05-02 NOTE — Consult Note (Signed)
Reason for Visit: This 54 year old Female patient presents to the clinic for initial evaluation of  breast cancer .   Referred by dr. Hervey Ard.  Diagnosis:  Chief Complaint/Diagnosis   54 year old female status post wide local excision and mammoplasty of the left breast for apathologic stage IA (T1 C. N0 M0) strongly ER/PR positive HER-2/neu amplified  Pathology Report pathology report reviewed   Imaging Report mammograms and ultrasound reviewed   Referral Report clinical notes reviewed   HPI   patient is a 54 year old female who presented in November of 2013 with a change in her mammogram showing a new larger more dense area in the upper outer aspect of the left breast BI-RADS for. She see Dr. Tollie Pizza and underwent biopsy which was positive for invasive mammary carcinoma. 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 Margins were clear but close at less than 1 mm in the anterior and superseded superficial margin although reexcision was performed. 4 sentinel lymph nodes were examined all negative for metastatic disease. She went on to have a mammoplasty by Dr. Tula Nakayama. Tumor was ER positive PR positive and HER-2/neu overexpressed. She is tolerated her mammoplasty well and is recovered nicely. She scheduled for right breast reduction after completion of all treatment. She is seen today in consultation for radiation opinion. She is doing well. No specific breast tenderness cough or bone pain at this time.  Past Hx:    Rheumatoid Arthritis:    Partial Hysterectomy/Tumor Removal: 6294   Nissan Fundoplication: 7654  Past, Family and Social History:  Past Medical History positive   Past Surgical History partial hysterectomy, benign ovarian cyst removed, hiatal hernia, stomach wrap   Past Medical History Comments rheumatoid arthritis, Nissan fundoplication, history of migraines   Family History positive   Family History Comments  family history positive for   Social History noncontributory   Additional Past Medical and Surgical History seen by herself today   Allergies:   No Known Allergies:   Home Meds:  Home Medications: Medication Instructions Status  Zoloft 100 mg oral tablet 1 tab(s)  once a day Active  diclofenac sodium 75 mg oral delayed release tablet 1 tab(s) orally 2 times a day Active  Tylenol 325 mg oral tablet 2 tab(s) orally every 4 hours if needed for soreness. Active  Norco 5 mg-325 mg oral tablet 1-2 tab(s) orally every 4 hours as needed for pain Active  Prempro 0.625 mg-2.5 mg oral tablet 1 tab(s) orally once a day Active   Review of Systems:  General negative   Performance Status (ECOG) 0   Skin negative   Breast see HPI   Ophthalmologic negative   ENMT negative   Respiratory and Thorax negative   Cardiovascular negative   Gastrointestinal negative   Genitourinary negative   Musculoskeletal negative   Neurological negative   Psychiatric negative   Hematology/Lymphatics negative   Endocrine negative   Allergic/Immunologic negative   Review of Systems   except for some chronic back pain secondary to her arthritis according to the nurse's notesPatient denies any weight loss, fatigue, weakness, fever, chills or night sweats. Patient denies any loss of vision, blurred vision. Patient denies any ringing  of the ears or hearing loss. No irregular heartbeat. Patient denies heart murmur or history of fainting. Patient denies any chest pain or pain radiating to her upper extremities. Patient denies any shortness of breath, difficulty breathing at night, cough or hemoptysis.  Patient denies any swelling in the lower legs. Patient denies any nausea vomiting, vomiting of blood, or coffee ground material in the vomitus. Patient denies any stomach pain. Patient states has had normal bowel movements no significant constipation or diarrhea. Patient denies any dysuria, hematuria or  significant nocturia. Patient denies any problems walking, swelling in the joints or loss of balance. Patient denies any skin changes, loss of hair or loss of weight. Patient denies any excessive worrying or anxiety or significant depression. Patient denies any problems with insomnia. Patient denies excessive thirst, polyuria, polydipsia. Patient denies any swollen glands, patient denies easy bruising or easy bleeding. Patient denies any recent infections, allergies or URI. Patient "s visual fields have not changed significantly in recent time.  Nursing Notes:  Nursing Vital Signs and Chemo Nursing Nursing Notes: *CC Vital Signs Flowsheet:   19-Feb-14 13:54  Temp Temperature 98  Pulse Pulse 98  Respirations Respirations 18  SBP SBP 115  DBP DBP 77  Current Weight (kg) (kg) 89.9  Height (cm) centimeters 178.7  BSA (m2) 2   Physical Exam:  General/Skin/HEENT:  General normal   Skin normal   Eyes normal   ENMT normal   Head and Neck normal   Additional PE well-developed slightly obese female in NAD. Lungs are clear to A&P cardiac examination shows regular rate and rhythm. Left breast is having mammoplasty. Incisions are healing well. There is asymmetry with the left breast more lifted than the right breast. No dominant mass or nodularity is noted in either breast into position examined. No axillary or subclavicular adenopathy is appreciated.   Breasts/Resp/CV/GI/GU:  Respiratory and Thorax normal   Cardiovascular normal   Gastrointestinal normal   Genitourinary normal   MS/Neuro/Psych/Lymph:  Musculoskeletal normal   Neurological normal   Lymphatics normal   Other Results:  Radiology Results: [place]:    24-May-13 10:55, US Breast Left  US Breast Left   REASON FOR EXAM:    AV LT PARENCHYMAL DENSITY  COMMENTS:       PROCEDURE: [place]  - US BREAST LEFT  - Jun 03 2011 10:55AM     RESULT: Reference is made to mammogram report which incorporates   ultrasonic  findings.    IMPRESSION:      BI-RADS: Category 4 - Suspicious Abnormality.      Thank you for the opportunity to contribute to the care of your patient.         Verified By: Osa Craver, M.D., MD  [facility]:    24-May-13 09:50, Digital Additional Views Lt Breast [facility])  Digital Additional Views Lt Breast (SCR)   REASON FOR EXAM:    AV LT PARENCHYMAL DENSITY  COMMENTS:       PROCEDURE: MAM - MAM DGTL ADD VW LT  SCR  - Jun 03 2011  9:50AM     RESULT: Compression spot films of the left breast reveal persistent   nodular density in the upper outer aspect of the left breast. Ultrasound   was obtained and reveals two, tiny solid lesions. There is a 5 mm solid   lesion at 2 o'clock. There is a 5 mm solid lesion at 3 o'clock. These two   lesions could represent very tiny malignancies. Ultrasound-directed   needle localization for surgical removal can be performed as indicated.    IMPRESSION:      1. Surgical evaluation is suggested.   BI-RADS: Category 4 - Suspicious Abnormality.      A NEGATIVE MAMMOGRAM REPORT DOES NOT  PRECLUDE BIOPSY OR OTHER EVALUATION   OF A CLINICALLY PALPABLE OR OTHERWISE SUSPICIOUS MASS OR LESION. BREAST   CANCER MAY NOT BE DETECTED BY MAMMOGRAPHY IN UP TO 10% OF CASES.      Thank you for the opportunity to contribute to the care of your patient.           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 Ma Hillock. Based on her overexpression of HER-2/neu she probably will will be a candidate for systemic adjuvant chemotherapy. He is evaluating her also today. I've gone over risks and benefits of  radiation therapy including redness of the skinirritation, alteration blood counts, and fatigue and inclusion of some normal superficial lung. Patient understands and comprehend my treatment plan well. We'll probably institute radiation therapy to the left breast would not have to include her peripheral nodes. I would treat her after completion of systemic chemotherapy. All her questions were answered. I also discussed the case personally with Dr. Ma Hillock.  I would like to take this opportunity to thank you for allowing me to continue to participate in this patient's care.  CC Referral:  cc: Dr. Hervey Ard   Electronic Signatures: Baruch Gouty, Roda Shutters (MD)  (Signed 19-Feb-14 15:46)  Authored: HPI, Diagnosis, Past Hx, PFSH, Allergies, Home Meds, ROS, Nursing Notes, Physical Exam, Other Results, Relevent Results, Encounter Assessment and Plan, CC Referring Physician   Last Updated: 19-Feb-14 15:46 by Armstead Peaks (MD)

## 2014-05-02 NOTE — Op Note (Signed)
PATIENT NAME:  Katrina Holland, Katrina Holland MR#:  [id number] DATE OF BIRTH:  October 02, 1960  DATE OF PROCEDURE:  01/30/2012  PREOPERATIVE DIAGNOSIS: Left breast cancer.   POSTOPERATIVE DIAGNOSIS: Left breast cancer.  OPERATIVE PROCEDURE: Wide local excision with ultrasound guidance, sentinel node biopsy.   SURGEON: Hervey Ard, MD   ANESTHESIA: General by LMA under Dr. [place] Service, Marcaine 0.5% with 1:200,000 units epinephrine, 30 mL local infiltration.   CLINICAL NOTE: This 54 year old woman was noted to have a change in her mammogram and vacuum biopsy showed evidence of a small area of infiltrating mammary carcinoma. Given her options for management, she desired to proceed with breast conservation. She was injected with technetium sulfur colloid prior to the procedure.   DESCRIPTION OF PROCEDURE: With the patient under adequate general anesthesia, the periareolar skin was prepped with alcohol and 6 mL of methylene blue diluted 1:2 with normal saline was injected, in the subareolar plexus. The breast was then prepped with ChloraPrep and draped. It was distracted medially to provide better exposure of the high lateral tumor area. Ultrasound was used to confirm tumor location. A 7 cm oblique incision over this area was made after confirming that the sentinel nodes were about 4 cm superior to this. The skin was incised sharply and the remaining dissection completed with electrocautery. A 4 x 5 x 5 cm block of tissue was removed. This was orientated and specimen radiograph obtained. There was a small area of fibrotic tissue superiorly and it was unclear whether this represented tumor or simple fibrosis. It was elected to take an ellipse of skin from the original excision to provide a clear superficial margin in the event that this did represent malignancy. Four sentinel nodes were identified, the smallest about 2 mm in diameter, and was sent in formalin for routine histology. The first two hot blue nodes and the  third hot non-blue node were all negative on touch prep for metastatic disease. Gross exam of the breast specimen did not suggest positive margins. The wound was closed in layers with 2-0 Vicryl figure-of-eight sutures. The skin was then closed    with a running 4-0 Vicryl subcuticular suture. Benzoin and Steri-Strips followed by a Telfa dressing was applied. Fluff gauze, Kerlix and an Ace wrap was then applied.   The patient tolerated the procedure well and was taken to the recovery room in stable condition.  ____________________________ Robert Bellow, MD jwb:sb D: 01/30/2012 20:45:42 ET T: 01/31/2012 08:53:25 ET JOB#: [id number]  cc: Robert Bellow, MD, <Dictator> Cleda Daub, MD JEFFREY Amedeo Kinsman MD ELECTRONICALLY SIGNED 01/31/2012 14:57

## 2014-05-06 ENCOUNTER — Other Ambulatory Visit: Payer: Self-pay | Admitting: *Deleted

## 2014-05-21 ENCOUNTER — Other Ambulatory Visit: Payer: Self-pay

## 2014-05-21 DIAGNOSIS — C50412 Malignant neoplasm of upper-outer quadrant of left female breast: Secondary | ICD-10-CM

## 2014-05-28 ENCOUNTER — Ambulatory Visit
Admission: RE | Admit: 2014-05-28 | Payer: Federal, State, Local not specified - PPO | Source: Ambulatory Visit | Admitting: Radiation Oncology

## 2014-06-02 ENCOUNTER — Telehealth: Payer: Self-pay | Admitting: Internal Medicine

## 2014-06-02 NOTE — Telephone Encounter (Signed)
We have received an e-mail to make this pt an appointment to see MW. Not sure what for. Called patient will await call back.

## 2014-06-06 NOTE — Telephone Encounter (Signed)
Called and spoke to pt. Pt has an appt on 6/8 at 0900. Pt verbalized understanding and denied any further questions or concerns at this time.

## 2014-06-09 ENCOUNTER — Other Ambulatory Visit: Payer: Self-pay | Admitting: *Deleted

## 2014-06-09 DIAGNOSIS — C50912 Malignant neoplasm of unspecified site of left female breast: Secondary | ICD-10-CM

## 2014-06-11 ENCOUNTER — Encounter (INDEPENDENT_AMBULATORY_CARE_PROVIDER_SITE_OTHER): Payer: Self-pay

## 2014-06-11 ENCOUNTER — Inpatient Hospital Stay (HOSPITAL_BASED_OUTPATIENT_CLINIC_OR_DEPARTMENT_OTHER): Payer: Federal, State, Local not specified - PPO | Admitting: Internal Medicine

## 2014-06-11 ENCOUNTER — Ambulatory Visit
Admission: RE | Admit: 2014-06-11 | Discharge: 2014-06-11 | Disposition: A | Discharge status: Home / Self Care | Payer: Federal, State, Local not specified - PPO | Source: Ambulatory Visit | Attending: Radiation Oncology | Admitting: Radiation Oncology

## 2014-06-11 ENCOUNTER — Inpatient Hospital Stay: Payer: Federal, State, Local not specified - PPO | Attending: Internal Medicine

## 2014-06-11 ENCOUNTER — Encounter: Payer: Self-pay | Admitting: Radiation Oncology

## 2014-06-11 VITALS — BP 115/73 | HR 83 | Temp 96.8°F | Resp 20 | Wt 183.0 lb

## 2014-06-11 DIAGNOSIS — R5382 Chronic fatigue, unspecified: Secondary | ICD-10-CM | POA: Insufficient documentation

## 2014-06-11 DIAGNOSIS — Z79899 Other long term (current) drug therapy: Secondary | ICD-10-CM

## 2014-06-11 DIAGNOSIS — G8929 Other chronic pain: Secondary | ICD-10-CM

## 2014-06-11 DIAGNOSIS — M47892 Other spondylosis, cervical region: Secondary | ICD-10-CM | POA: Diagnosis not present

## 2014-06-11 DIAGNOSIS — Z9221 Personal history of antineoplastic chemotherapy: Secondary | ICD-10-CM | POA: Insufficient documentation

## 2014-06-11 DIAGNOSIS — R208 Other disturbances of skin sensation: Secondary | ICD-10-CM | POA: Diagnosis not present

## 2014-06-11 DIAGNOSIS — M79602 Pain in left arm: Secondary | ICD-10-CM | POA: Insufficient documentation

## 2014-06-11 DIAGNOSIS — M2550 Pain in unspecified joint: Secondary | ICD-10-CM | POA: Diagnosis not present

## 2014-06-11 DIAGNOSIS — N644 Mastodynia: Secondary | ICD-10-CM

## 2014-06-11 DIAGNOSIS — F419 Anxiety disorder, unspecified: Secondary | ICD-10-CM | POA: Diagnosis not present

## 2014-06-11 DIAGNOSIS — K589 Irritable bowel syndrome without diarrhea: Secondary | ICD-10-CM | POA: Insufficient documentation

## 2014-06-11 DIAGNOSIS — E876 Hypokalemia: Secondary | ICD-10-CM | POA: Insufficient documentation

## 2014-06-11 DIAGNOSIS — Z170 Estrogen receptor positive status [ER+]: Secondary | ICD-10-CM | POA: Insufficient documentation

## 2014-06-11 DIAGNOSIS — Z87891 Personal history of nicotine dependence: Secondary | ICD-10-CM | POA: Diagnosis not present

## 2014-06-11 DIAGNOSIS — Z79810 Long term (current) use of selective estrogen receptor modulators (SERMs): Secondary | ICD-10-CM

## 2014-06-11 DIAGNOSIS — K219 Gastro-esophageal reflux disease without esophagitis: Secondary | ICD-10-CM | POA: Insufficient documentation

## 2014-06-11 DIAGNOSIS — C50912 Malignant neoplasm of unspecified site of left female breast: Secondary | ICD-10-CM

## 2014-06-11 DIAGNOSIS — C50412 Malignant neoplasm of upper-outer quadrant of left female breast: Secondary | ICD-10-CM

## 2014-06-11 LAB — CBC WITH DIFFERENTIAL/PLATELET
Basophils Absolute: 0 10*3/uL (ref 0–0.1)
Basophils Relative: 0 %
Eosinophils Absolute: 0.1 10*3/uL (ref 0–0.7)
Eosinophils Relative: 1 %
HCT: 38.3 % (ref 35.0–47.0)
Hemoglobin: 12.6 g/dL (ref 12.0–16.0)
Lymphocytes Relative: 38 %
Lymphs Abs: 2.4 10*3/uL (ref 1.0–3.6)
MCH: 31.5 pg (ref 26.0–34.0)
MCHC: 32.9 g/dL (ref 32.0–36.0)
MCV: 95.9 fL (ref 80.0–100.0)
Monocytes Absolute: 0.4 10*3/uL (ref 0.2–0.9)
Monocytes Relative: 6 %
Neutro Abs: 3.5 10*3/uL (ref 1.4–6.5)
Neutrophils Relative %: 55 %
Platelets: 238 10*3/uL (ref 150–440)
RBC: 3.99 MIL/uL (ref 3.80–5.20)
RDW: 12.1 % (ref 11.5–14.5)
WBC: 6.4 10*3/uL (ref 3.6–11.0)

## 2014-06-11 LAB — COMPREHENSIVE METABOLIC PANEL
ALT: 29 U/L (ref 14–54)
AST: 39 U/L (ref 15–41)
Albumin: 3.8 g/dL (ref 3.5–5.0)
Alkaline Phosphatase: 89 U/L (ref 38–126)
Anion gap: 5 (ref 5–15)
BUN: 11 mg/dL (ref 6–20)
CO2: 26 mmol/L (ref 22–32)
Calcium: 8.7 mg/dL — ABNORMAL LOW (ref 8.9–10.3)
Chloride: 106 mmol/L (ref 101–111)
Creatinine, Ser: 0.6 mg/dL (ref 0.44–1.00)
GFR calc Af Amer: >60 mL/min (ref >60)
GFR calc non Af Amer: >60 mL/min (ref >60)
Glucose, Bld: 97 mg/dL (ref 65–99)
Potassium: 3.3 mmol/L — ABNORMAL LOW (ref 3.5–5.1)
Sodium: 137 mmol/L (ref 135–145)
Total Bilirubin: 0.5 mg/dL (ref 0.3–1.2)
Total Protein: 6.4 g/dL — ABNORMAL LOW (ref 6.5–8.1)

## 2014-06-11 MED ORDER — POTASSIUM CHLORIDE CRYS ER 20 MEQ PO TBCR: 20.0000 meq | EXTENDED_RELEASE_TABLET | Freq: Every day | ORAL | Status: DC

## 2014-06-11 NOTE — Progress Notes (Signed)
Radiation Oncology Follow up Note  Name: Katrina Holland   Date:   06/11/2014 MRN:  [id number] DOB: 09-10-1960    This 54 y.o. female presents to the clinic today for follow-up breast cancer.  REFERRING PROVIDER: No ref. provider found  HPI: Patient is a 54 year old female now out 18 months having completed whole breast radiation to the left breast for an upper outer invasive mammary carcinoma 1.2 cm overall grade 3 tumor was stage IA (T1 cN0 M0) strongly ER/PR positive and HER-2/neu amplified. She is undergone right breast reduction and mammoplasty of the left breast. She is seen today in routine follow-up and is doing well. Has some tenderness in her left axilla which is most likely related to scar tissue. She specifically denies breast tenderness cough or bone pain. She is currently on aromatase inhibitor therapy and tolerating that well..  COMPLICATIONS OF TREATMENT: none  FOLLOW UP COMPLIANCE: keeps appointments   PHYSICAL EXAM:  BP 115/73 mmHg  Pulse 83  Temp(Src) 96.8 F (36 C)  Resp 20  Wt 182 lb 15.7 oz (83 kg) Patient is status post mammoplasty of the left breast and breast reduction of the right breast. No dominant mass or nodularity is noted in either breast in 2 positions examined. No axillary or supraclavicular adenopathy is noted. There is some scar tissue present in the left axilla which is probably causing some of her tenderness. Well-developed well-nourished patient in NAD. HEENT reveals PERLA, EOMI, discs not visualized.  Oral cavity is clear. No oral mucosal lesions are identified. Neck is clear without evidence of cervical or supraclavicular adenopathy. Lungs are clear to A&P. Cardiac examination is essentially unremarkable with regular rate and rhythm without murmur rub or thrill. Abdomen is benign with no organomegaly or masses noted. Motor sensory and DTR levels are equal and symmetric in the upper and lower extremities. Cranial nerves II through XII are grossly intact.  Proprioception is intact. No peripheral adenopathy or edema is identified. No motor or sensory levels are noted. Crude visual fields are within normal range.   RADIOLOGY RESULTS: Patient is scheduled for follow-up mammograms in the next several weeks  PLAN: At the present time she continues to do well with no evidence of disease. I am please were overall progress. I've asked to see her back in 1 year for follow-up. She continues close follow-up care with surgeon. Patient knows to call sooner with any concerns.  I would like to take this opportunity for allowing me to participate in the care of your patient.Armstead Peaks., MD

## 2014-06-18 ENCOUNTER — Ambulatory Visit (INDEPENDENT_AMBULATORY_CARE_PROVIDER_SITE_OTHER): Payer: Federal, State, Local not specified - PPO | Admitting: Internal Medicine

## 2014-06-18 ENCOUNTER — Ambulatory Visit (INDEPENDENT_AMBULATORY_CARE_PROVIDER_SITE_OTHER)
Admission: RE | Admit: 2014-06-18 | Discharge: 2014-06-18 | Disposition: A | Discharge status: Home / Self Care | Payer: Federal, State, Local not specified - PPO | Source: Ambulatory Visit | Attending: Internal Medicine | Admitting: Internal Medicine

## 2014-06-18 ENCOUNTER — Encounter: Payer: Self-pay | Admitting: Internal Medicine

## 2014-06-18 VITALS — BP 118/84 | HR 79 | Ht 62.0 in | Wt 183.2 lb

## 2014-06-18 DIAGNOSIS — R05 Cough: Secondary | ICD-10-CM | POA: Diagnosis not present

## 2014-06-18 DIAGNOSIS — R0600 Dyspnea, unspecified: Secondary | ICD-10-CM

## 2014-06-18 DIAGNOSIS — R058 Other specified cough: Secondary | ICD-10-CM

## 2014-06-18 MED ORDER — MONTELUKAST SODIUM 10 MG PO TABS: 10.0000 mg | ORAL_TABLET | Freq: Every day | ORAL | Status: DC

## 2014-06-18 NOTE — Patient Instructions (Addendum)
Restart singulair 10 mg daily   Please remember to go to the  x-ray department downstairs for your tests - we will call you with the results when they are available.     Please schedule a follow up office visit in 6 weeks, call sooner if needed with pfts on return

## 2014-06-18 NOTE — Progress Notes (Signed)
Quick Note:  Spoke with pt and notified of results per Dr. Wert. Pt verbalized understanding and denied any questions.  ______ 

## 2014-06-18 NOTE — Progress Notes (Signed)
Subjective:    Patient ID: Katrina Holland, female    DOB: 04/08/1960  MRN: [id number]   Brief patient profile:  58 yowf quit smoking in 2009 with tendency to sev x yearly "croopy cough all her life" fine in between with no need for maint rx then summer  2013 more of a chronic daily sob/ cough then dx Dec 2013 breast ca rx with chemo starting March 2014 and RT completed Nov 2014 with a persistent cough and sob since then eval by [name] with muiltiple tests inconclusive for cause of sob and no rx for cough so self referred to pulmonary clinic 04/08/13.   History of Present Illness  04/08/2013 1st [place] [facility] office visit/ Margareta Laureano  Chief Complaint  Patient presents with  . Advice Only    Self referred for URI.  Pt states it started as sinus congestion, has since moved to chest.  Wheezing, sometimes prod cough with light yellow mucous X3 weeks.     acutely ill Nov 2014 first rx with zpak/ steroids/assoc new hoarseness getting some better but not yet approaching baseline cough and sob, assoc with sensation of  Nose runs all day, not all night - but cough worst first thing in am typically non purulent. Sob x 50 ft even if not coughing. rec Prednisone 10 mg take  4 each am x 2 days,   2 each am x 2 days,  1 each am x 2 days and stop  Cough suppression with tramadol  prilosec 20mg   Take 30-60 min before first meal of the day and Zantac 150  mg one bedtime and chlortrimeton 4 mg take 2 at bedtime until cough is completely gone for at least a week without the need for cough suppression GERD  Diet  Sinus CT  04/08/2013 > Air-fluid level within maxillary sinus bilaterally > rx with Augmentin 875 mg take one pill twice daily  X 10 days  > could not take due to n and v > changed to [facility]    04/22/2013 f/u ov/Nickola Lenig re:  Chief Complaint  Patient presents with  . Follow-up    Cough some better, but not resolved. Clearing throat often. No new co's today.  mostly sob with coughing Nasal and ear  congestions s change, no purulent secretions  rec Start singulair 10 mg daily  Please see patient coordinator before you leave today  to schedule ent eval > sinuses were blocked > resolved  Prednisone 10 mg take  4 each am x 2 days,   2 each am x 2 days,  1 each am x 2 days and stop Please remember to go to the lab department > neg allergy profile    06/18/2014 f/u ov/Preeya Cleckley re: sob/ cough worse Chief Complaint  Patient presents with  . Follow-up    Pt states ran out singulair for approx 1 month and her breathing has been worse since then.  She states she is "always SOB". She has minimal cough- non prod.   all symptoms better to her satisfaction while on singulair except for breathing 50% better at best / cough is mostly daytime and not noct  Breathing issues daily since 2013 worse with hot humid weather/ not noct  Has cpap not using and daytime drowsy Reproducible doe x 50 ft / has to lean on cart at HT   No obvious day to day or daytime variabilty or assoc  cp or chest tightness, subjective wheeze overt sinus or hb symptoms. No unusual exp  hx or h/o childhood pna/ asthma or knowledge of premature birth.  Sleeping ok without nocturnal  or early am exacerbation  of respiratory  c/o's or need for noct saba. Also denies any obvious fluctuation of symptoms with weather or environmental changes or other aggravating or alleviating factors except as outlined above   Current Medications, Allergies, Complete Past Medical History, Past Surgical History, Family History, and Social History were reviewed in [employer] record.  ROS  The following are not active complaints unless bolded sore throat, dysphagia, dental problems, itching, sneezing,  nasal congestion or excess/ purulent secretions, ear ache,   fever, chills, sweats, unintended wt loss, pleuritic or exertional cp, hemoptysis,  orthopnea pnd or leg swelling, presyncope, palpitations, heartburn, abdominal pain, anorexia,  nausea, vomiting, diarrhea  or change in bowel or urinary habits, change in stools or urine, dysuria,hematuria,  rash, arthralgias, visual complaints, headache, numbness weakness or ataxia or problems with walking or coordination,  change in mood/affect or memory.                  Objective:   Physical Exam  amb  wf  nad harsh barking cough   04/22/13        Wt 205  >   06/18/2014   183  Wt Readings from Last 3 Encounters:  04/08/13 210 lb (95.255 kg)  12/26/12 202 lb (91.627 kg)  06/27/12 191 lb (86.637 kg)      HEENT: nl dentition, turbinates, and orophanx. Nl external ear canals without cough reflex   NECK :  without JVD/Nodes/TM/ nl carotid upstrokes bilaterally   LUNGS: no acc muscle use, clear to A and P bilaterally without cough on insp or exp maneuvers   CV:  RRR  no s3 or murmur or increase in P2, no edema   ABD:  soft and nontender with nl excursion in the supine position. No bruits or organomegaly, bowel sounds nl  MS:  warm without deformities, calf tenderness, cyanosis or clubbing  SKIN: warm and dry without lesions    NEURO:  alert, approp, no deficits     CXR PA and Lateral:   06/18/2014 :     I personally reviewed images and agree with radiology impression as follows:     No active cardiopulmonary disease.  CBC 06/11/14 neg Eos    Assessment & Plan:

## 2014-06-22 ENCOUNTER — Encounter: Payer: Self-pay | Admitting: Internal Medicine

## 2014-06-22 NOTE — Assessment & Plan Note (Signed)
-   04/08/2013  Walked RA x 3 laps @ 185 ft each stopped due to  End of study no desat or sob  - 06/18/2014  Walked RA x 3 laps @ 185 ft each stopped due to  End of study, fast pace, min sob sat 92% at end   No clear mechanism for sob > needs pfts p back on singulair x 4-6 weeks, in meantime could learn to pace a little slower

## 2014-06-22 NOTE — Assessment & Plan Note (Signed)
-   sinus CT  04/08/2013 > Air-fluid level within maxillary sinus bilaterally > rx omnicef  - allergy profile 04/22/2013 >  Eos 2.5%,   IgE 1.5 with neg RAST >    singulair trial  started 04/23/14, worse off it 06/18/14 > restarted  - ENT  04/22/2013 referral > ? Not done?   I had an extended discussion with the patient reviewing all relevant studies completed to date and  lasting 15 to 20 minutes of a 25 minute visit on the following ongoing concerns:   1) clearly needs to be back on singulair 10 mg each pm   2) needs pfts and consider adding ICS next ov   3) will need to recheck sinus ct if not being actively followed by ent and still symptomatic at next ov  4) Each maintenance medication was reviewed in detail including most importantly the difference between maintenance and as needed and under what circumstances the prns are to be used.  Please see instructions for details which were reviewed in writing and the patient given a copy.

## 2014-06-25 ENCOUNTER — Other Ambulatory Visit: Payer: Self-pay

## 2014-06-25 ENCOUNTER — Inpatient Hospital Stay: Payer: Federal, State, Local not specified - PPO | Attending: Internal Medicine

## 2014-06-25 DIAGNOSIS — Z170 Estrogen receptor positive status [ER+]: Secondary | ICD-10-CM | POA: Insufficient documentation

## 2014-06-25 DIAGNOSIS — Z79810 Long term (current) use of selective estrogen receptor modulators (SERMs): Secondary | ICD-10-CM | POA: Diagnosis not present

## 2014-06-25 DIAGNOSIS — Z9221 Personal history of antineoplastic chemotherapy: Secondary | ICD-10-CM | POA: Diagnosis not present

## 2014-06-25 DIAGNOSIS — C50412 Malignant neoplasm of upper-outer quadrant of left female breast: Secondary | ICD-10-CM | POA: Diagnosis present

## 2014-06-25 DIAGNOSIS — C50912 Malignant neoplasm of unspecified site of left female breast: Secondary | ICD-10-CM

## 2014-06-25 LAB — POTASSIUM: Potassium: 3.5 mmol/L (ref 3.5–5.1)

## 2014-06-29 NOTE — Progress Notes (Signed)
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 Tumor size 1.2 cm, grade 3, margins uninvolved.  4 sentinel lymph nodes negative for metastasis. ER positive (> 90%), PR positive (> 90%).  HER-2/neu positive by FISH  (Her2/CEP17 ratio of 6.04).  Patient got adjuvant chemotherapy, completed AC x 4 cycles, then weekly Taxol x 12 on 09/19/12, then completed Herceptin on 07/03/13. On hormonal therapy with Tamoxifen (intolerant of aromatase inhibitors).   HISTORY OF PRESENT ILLNESS:  Patient returns for continued oncology evaluation for breast cancer followup. She is on Tamoxifen, states that her chronic symptoms of joint pain, fatigue on exertion are unchanged. Also, has chronic paresthesias in hands but attributes this to known h/o cervicaal spondylosis. Denies new paresthesias or other neurological symptoms. Has intermittent chronic left breast and left arm pain. Denies any progressive or recurrent dyspnea, remains physically active. Appetite and weight is steady. No nausea or vomiting. Last mammogram and ultrasound was reported abnormal and she had biopsy by Dr. Bary Castilla which reportedly was negative for malignancy. No new mood disturbances. Denies feeling new breast masses on self-exam. No new bone pains, 0/10.    REVIEW OF SYSTEMS:   ROS As in HPI above. In addition, no fever, chills or sweats. No new headaches or focal weakness.  No new sore throat, cough, shortness of breath, sputum, hemoptysis or chest pain. No dizziness  or palpitation. No abdominal pain, constipation, diarrhea, dysuria or hematuria. No new skin rash or bleeding symptoms. No polyuria polydipsia. PS ECOG 1.  PAST MEDICAL HISTORY: Reviewed Past Medical History  Diagnosis Date  . Breast mass   . Anxiety   . GERD (gastroesophageal reflux disease)   . Pulmonary hypertension 2013  . Irritable bowel syndrome   . Endometriosis 2001  . Sleep apnea 2007  . Sleep apnea 2007  . 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 Ma Hillock..          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 Bary Castilla, followed by reconstruction surgery end of January 2014. Tumor size 1.2 cm, grade 3, ER/PR positive, HER-2/neu positive.    Left breast biopsy in May 2013, reported benign.  PAST SURGICAL HISTORY:Reviewed Past Surgical History  Procedure Laterality Date  . Breast enhancement surgery  2014,05/2013  . Port a cath revision  2014  . Dilation and curettage of uterus  2011  . Ovary surgery Right 2001  . Nissen fundoplication  2694  . Tonsillectomy  1969  . Hernia repair  1997  . Colonoscopy  2012  . Right heart cath  2013  . Exploratory laparotomy  2001  . Facial cosmetic surgery  2015  . Breast surgery Left 01/30/2012    Wide excision, sentinel node biopsy  . Breast surgery Left 02/09/2012    Left breast reduction/reconstruction: Nicholaus Bloom, MD.  . Breast surgery Left December 26, 2012    ultrasound-guided biopsy, post surgical change.    FAMILY HISTORY:Reviewed Family History  Problem  Relation Age of Onset  . Colon cancer Paternal Grandfather   Denies history of breast, ovarian or colon cancers.  ADVANCED DIRECTIVES:  No - patient declined information  SOCIAL HISTORY: Reviewed History  Substance Use Topics  . Smoking status: Former Smoker -- 0.10 packs/day for 25 years    Types: Cigarettes    Quit date: 01/11/2007  . Smokeless tobacco: Never Used     Comment: "social smoker" per pt.  . Alcohol Use: No  Ex-social smoker, quit more than 5 years ago. Denies alcohol or recreational drug usage.  Works in [employer].  Physically active and ambulatory.  Allergies  Allergen Reactions  . Augmentin [Amoxicillin-Pot Clavulanate]     "makes me sick"    Current Outpatient Prescriptions  Medication Sig Dispense Refill  . diclofenac (VOLTAREN) 75 MG EC tablet Take 75 mg by mouth 2 (two) times daily.     . montelukast (SINGULAIR) 10 MG tablet Take 1 tablet (10 mg total) by mouth at bedtime. 30 tablet 11  . naproxen sodium (ANAPROX) 220 MG tablet Take 220 mg by mouth 2 (two) times daily as needed.    . phentermine (ADIPEX-P) 37.5 MG tablet 1 tablet daily    . potassium chloride SA (K-DUR,KLOR-CON) 20 MEQ tablet Take 1 tablet (20 mEq total) by mouth daily. 8 tablet 0  . sertraline (ZOLOFT) 100 MG tablet Take 100 mg by mouth daily.    . tamoxifen (NOLVADEX) 20 MG tablet Take 1 tablet by mouth daily.    . traMADol (ULTRAM) 50 MG tablet Take 50 mg by mouth every 6 (six) hours as needed.     No current facility-administered medications for this visit.    OBJECTIVE: There were no vitals filed for this visit.   There is no weight on file to calculate BMI.    ECOG FS:1 - Symptomatic but completely ambulatory  GENERAL: Patient is alert and oriented and in no acute distress. There is no icterus. HEENT: EOMs intact.  No cervical lymphadenopathy. CVS: S1S2, regular LUNGS: Bilaterally clear to auscultation, no rhonchi. ABDOMEN: Soft, nontender. No hepatomegaly  NEURO: grossly  nonfocal, cranial nerves are intact. Gait unremarkable. EXTREMITIES: No pedal edema. BREASTS: chronic scar tissue/thickeneing in left breast at prior surgery site, otherwise no dominant masses palpable in either breast. No axillary adenopathy on either side. Exam performed in presence of a nurse.   LAB RESULTS:     Component Value Date/Time   NA 137 06/11/2014 1416   NA 140 09/25/2013 1425   K 3.5 06/25/2014 1519   K 4.0 09/25/2013 1425   CL 106 06/11/2014 1416   CL 103 09/25/2013 1425   CO2 26 06/11/2014 1416   CO2 29 09/25/2013 1425   GLUCOSE 97 06/11/2014 1416   GLUCOSE 92 09/25/2013 1425   BUN 11 06/11/2014 1416   BUN 17 09/25/2013 1425   CREATININE 0.60 06/11/2014 1416   CREATININE 0.87 09/25/2013 1425   CALCIUM 8.7* 06/11/2014 1416   CALCIUM 10.1 09/25/2013 1425   PROT 6.4* 06/11/2014 1416   PROT 7.2 09/25/2013 1425   ALBUMIN 3.8 06/11/2014 1416   ALBUMIN 3.9 09/25/2013 1425   AST 39 06/11/2014 1416   AST  30 09/25/2013 1425   ALT 29 06/11/2014 1416   ALT 55 09/25/2013 1425   ALKPHOS 89 06/11/2014 1416   ALKPHOS 154* 09/25/2013 1425   BILITOT 0.5 06/11/2014 1416   GFRNONAA >60 06/11/2014 1416   GFRNONAA >60 09/25/2013 1425   GFRAA >60 06/11/2014 1416   GFRAA >60 09/25/2013 1425   Lab Results  Component Value Date   WBC 6.4 06/11/2014   NEUTROABS 3.5 06/11/2014   HGB 12.6 06/11/2014   HCT 38.3 06/11/2014   MCV 95.9 06/11/2014   PLT 238 06/11/2014    STAGING: Breast cancer   Staging form: Breast, AJCC 7th Edition     Clinical stage from 06/19/2014: Stage IA (T1c, N0, M0) - Signed by Leia Alf, MD on 06/19/2014   ASSESSMENT / PLAN:   1. 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 Tumor size 1.2 cm, grade 3, ER/PR positive, HER-2/neu positive (Her2/CEP17 ratio of 6.04). 4 sentinel lymph nodes negative  for metastasis  -  Reviewed labs and d/w patient. Clinically no evidence to suggest recurrent or metastatic breast cancer. She had abnormal mammogram and ultrasound reported in Dec 2014 and had biopsy by Dr. Bary Castilla which reportedly was negative for malignancy. Last MUGA scan in April 2015 normal at 59% (was 57.5 and 63.5 on prior studies). DEXA scan in October showed osteopenia with T score of -1.4 AP spine. Given her joint symptoms, she came off of aromatase inhibitor and is on Tamoxifen, will continue this at 20 mg PO daily. States she has next mammogram in 2-3 weeks and sees surgeon after this. Will follow up at 6 months with repeat labs for continued surveillance. 2. 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  No acute abnormality. Cervical spondylosis at C4-5 and C5-6 causing mild spinal stenosis. Overall findings are similar to the MRI of 09/19/2010). Chronic issue, follows with her PMD. 3. Hypokalemia - Kdur 20 meq daily x 8, will repeat K+ level in 2 weeks.  In between visits patient advised to call or come to the ER in case of any new symptoms or acute sickness. She is agreeable to this plan.      Leia Alf, MD   06/29/2014 6:59 AM

## 2014-07-09 ENCOUNTER — Encounter: Payer: Self-pay | Admitting: General Surgery

## 2014-07-23 ENCOUNTER — Encounter: Payer: Self-pay | Admitting: General Surgery

## 2014-07-23 ENCOUNTER — Ambulatory Visit (INDEPENDENT_AMBULATORY_CARE_PROVIDER_SITE_OTHER): Payer: Federal, State, Local not specified - PPO | Admitting: General Surgery

## 2014-07-23 VITALS — BP 128/72 | HR 74 | Resp 12 | Ht 62.0 in | Wt 173.0 lb

## 2014-07-23 DIAGNOSIS — Z853 Personal history of malignant neoplasm of breast: Secondary | ICD-10-CM

## 2014-07-23 NOTE — Progress Notes (Signed)
Patient ID: Katrina Holland, female   DOB: [birth year], 54 y.o.   MRN: [id number]  Chief Complaint  Patient presents with  . Other    mammogram    HPI Katrina Holland is a 54 y.o. female who presents for a breast evaluation. The most recent mammogram was done on 07/08/14. Patient does perform regular self breast checks and gets regular mammograms done. She has no new breast complaints.   HPI  Past Medical History  Diagnosis Date  . Breast mass   . Anxiety   . GERD (gastroesophageal reflux disease)   . Pulmonary hypertension 2013  . Irritable bowel syndrome   . Endometriosis 2001  . Sleep apnea 2007  . Sleep apnea 2007  . 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 Ma Hillock..    Past Surgical History  Procedure Laterality Date  . Breast enhancement surgery  2014,05/2013  . Port a cath revision  2014  . Dilation and curettage of uterus  2011  . Ovary surgery Right 2001  . Nissen fundoplication  1287  . Tonsillectomy  1969  . Hernia repair  1997  . Colonoscopy  2012  . Right heart cath  2013  . Exploratory laparotomy  2001  . Facial cosmetic surgery  2015  . Breast surgery Left 01/30/2012    Wide excision, sentinel node biopsy  . Breast surgery Left 02/09/2012    Left breast reduction/reconstruction: Nicholaus Bloom, MD.  . Breast surgery Left December 26, 2012    ultrasound-guided biopsy, post surgical change.    Family History  Problem Relation Age of Onset  . Colon cancer Paternal Grandfather     Social History History  Substance Use Topics  . Smoking status: Former Smoker -- 0.10 packs/day for 25 years    Types: Cigarettes    Quit date: 01/11/2007  . Smokeless tobacco: Never Used     Comment: "social smoker" per pt.  .  Alcohol Use: No    Allergies  Allergen Reactions  . Augmentin [Amoxicillin-Pot Clavulanate]     "makes me sick"    Current Outpatient Prescriptions  Medication Sig Dispense Refill  . diclofenac (VOLTAREN) 75 MG EC tablet Take 75 mg by mouth 2 (two) times daily.     . montelukast (SINGULAIR) 10 MG tablet Take 1 tablet (10 mg total) by mouth at bedtime. 30 tablet 11  . naproxen sodium (ANAPROX) 220 MG tablet Take 220 mg by mouth 2 (two) times daily as needed.    . phentermine (ADIPEX-P) 37.5 MG tablet 1 tablet daily    . sertraline (ZOLOFT) 100 MG tablet Take 100 mg by mouth daily.    . tamoxifen (NOLVADEX) 20 MG tablet Take 1 tablet by mouth daily.    . potassium chloride SA (K-DUR,KLOR-CON) 20 MEQ tablet Take 1 tablet (20 mEq total) by mouth daily. 8 tablet 0   No current facility-administered medications for this visit.    Review of Systems Review of Systems  Constitutional: Negative.   Respiratory: Negative.   Cardiovascular: Negative.     Blood pressure 128/72, pulse 74, resp. rate 12, height _0  (1.575 m), weight 173 lb (78.472 kg).  Physical Exam Physical Exam  Constitutional: She is oriented to person, place, and time. She appears well-developed and well-nourished.  Eyes: Conjunctivae are normal. No  scleral icterus.  Neck: Neck supple.  Cardiovascular: Normal rate, regular rhythm and normal heart sounds.   Pulmonary/Chest: Effort normal and breath sounds normal. Right breast exhibits no inverted nipple, no mass, no nipple discharge, no skin change and no tenderness. Left breast exhibits no inverted nipple, no mass, no nipple discharge, no skin change and no tenderness.    Abdominal: Soft. Normal appearance and bowel sounds are normal.  Lymphadenopathy:    She has no cervical adenopathy.  Neurological: She is alert and oriented to person, place, and time.  Skin: Skin is warm and dry.  Psychiatric: She has a normal mood and affect.    Data Reviewed Bilateral  diagnostic mammograms dated 07/08/2014 were reviewed. Postsurgical changes are noted. Ultrasound of the left axilla was negative. BI-RADS-2.  Assessment    Benign breast exam, doing well now 2.5 years after treatment. Tolerating tamoxifen well.    Plan    The patient reports that she broke up with her boyfriend of 7 years. She seems to be in fairly good spirits about this event.    The patient has been asked to return to the office in one year with a bilateral diagnostic mammogram.   Katrina Holland 07/23/2014, 8:31 PM

## 2014-07-30 ENCOUNTER — Ambulatory Visit: Payer: Self-pay | Admitting: Internal Medicine

## 2014-08-27 ENCOUNTER — Ambulatory Visit (INDEPENDENT_AMBULATORY_CARE_PROVIDER_SITE_OTHER): Payer: Federal, State, Local not specified - PPO | Admitting: Internal Medicine

## 2014-08-27 ENCOUNTER — Encounter: Payer: Self-pay | Admitting: Internal Medicine

## 2014-08-27 VITALS — BP 110/74 | HR 96 | Ht 63.0 in | Wt 174.0 lb

## 2014-08-27 DIAGNOSIS — R0600 Dyspnea, unspecified: Secondary | ICD-10-CM

## 2014-08-27 DIAGNOSIS — E669 Obesity, unspecified: Secondary | ICD-10-CM

## 2014-08-27 DIAGNOSIS — R058 Other specified cough: Secondary | ICD-10-CM

## 2014-08-27 DIAGNOSIS — R05 Cough: Secondary | ICD-10-CM | POA: Diagnosis not present

## 2014-08-27 LAB — PULMONARY FUNCTION TEST
DL/VA % pred: 96 %
DL/VA: 4.54 ml/min/mmHg/L
DLCO unc % pred: 106 %
DLCO unc: 24.47 ml/min/mmHg
FEF 25-75 Post: 3.95 L/sec
FEF 25-75 Pre: 3.95 L/sec
FEF2575-%Change-Post: 0 %
FEF2575-%Pred-Post: 153 %
FEF2575-%Pred-Pre: 153 %
FEV1-%Change-Post: 0 %
FEV1-%Pred-Post: 131 %
FEV1-%Pred-Pre: 131 %
FEV1-Post: 3.46 L
FEV1-Pre: 3.46 L
FEV1FVC-%Change-Post: 2 %
FEV1FVC-%Pred-Pre: 108 %
FEV6-%Change-Post: -1 %
FEV6-%Pred-Post: 121 %
FEV6-%Pred-Pre: 123 %
FEV6-Post: 3.96 L
FEV6-Pre: 4.01 L
FEV6FVC-%Change-Post: 0 %
FEV6FVC-%Pred-Post: 103 %
FEV6FVC-%Pred-Pre: 102 %
FVC-%Change-Post: -1 %
FVC-%Pred-Post: 118 %
FVC-%Pred-Pre: 120 %
FVC-Post: 3.96 L
FVC-Pre: 4.04 L
Post FEV1/FVC ratio: 87 %
Post FEV6/FVC ratio: 100 %
Pre FEV1/FVC ratio: 86 %
Pre FEV6/FVC Ratio: 99 %
RV % pred: 94 %
RV: 1.7 L
TLC % pred: 112 %
TLC: 5.53 L

## 2014-08-27 NOTE — Progress Notes (Signed)
PFT done today. 

## 2014-08-27 NOTE — Patient Instructions (Addendum)
Call if you want [place] to refer you to a sinus specialist for recurrent sinus infections (ENT doctor)  Continue singulair 10 mg daily    If you are satisfied with your treatment plan,  let your doctor know and he/she can either refill your medications or you can return here when your prescription runs out.     If in any way you are not 100% satisfied,  please tell us.  If 100% better, tell your friends!  Pulmonary follow up is as needed

## 2014-08-27 NOTE — Progress Notes (Signed)
Subjective:    Patient ID: Katrina Holland, female    DOB: [birth year]  MRN: [id number]   Brief patient profile:  73  yowf quit smoking in 2009 with tendency to sev x yearly "croopy cough all her life" fine in between with no need for maint rx then summer  2013 more of a chronic daily sob/ cough then dx Dec 2013 breast ca rx with chemo starting March 2014 and RT completed Nov 2014 with a persistent cough and sob since then eval by [name] with muiltiple tests inconclusive for cause of sob and no rx for cough so self referred to pulmonary clinic 04/08/13 > nl pfts 08/27/2014     History of Present Illness  04/08/2013 1st [facility] office visit/ [facility]  Chief Complaint  Patient presents with  . Advice Only    Self referred for URI.  Pt states it started as sinus congestion, has since moved to chest.  Wheezing, sometimes prod cough with light yellow mucous X3 weeks.     acutely ill Nov 2014 first rx with zpak/ steroids/assoc new hoarseness getting some better but not yet approaching baseline cough and sob, assoc with sensation of  Nose runs all day, not all night - but cough worst first thing in am typically non purulent. Sob x 50 ft even if not coughing. rec Prednisone 10 mg take  4 each am x 2 days,   2 each am x 2 days,  1 each am x 2 days and stop  Cough suppression with tramadol  prilosec 20mg   Take 30-60 min before first meal of the day and Zantac 150  mg one bedtime and chlortrimeton 4 mg take 2 at bedtime until cough is completely gone for at least a week without the need for cough suppression GERD  Diet  Sinus CT  04/08/2013 > Air-fluid level within maxillary sinus bilaterally > rx with Augmentin 875 mg take one pill twice daily  X 10 days  > could not take due to n and v > changed to omnicef > improved    04/22/2013 f/u ov/[facility] re:  Chief Complaint  Patient presents with  . Follow-up    Cough some better, but not resolved. Clearing throat often. No new co's today.  mostly sob  with coughing Nasal and ear congestions s change, no purulent secretions  rec Start singulair 10 mg daily  Please see patient coordinator before you leave today  to schedule ent eval > sinuses were blocked > resolved  Prednisone 10 mg take  4 each am x 2 days,   2 each am x 2 days,  1 each am x 2 days and stop Please remember to go to the lab department > neg allergy profile    06/18/2014 f/u ov/[facility] re: sob/ cough worse Chief Complaint  Patient presents with  . Follow-up    Pt states ran out singulair for approx 1 month and her breathing has been worse since then.  She states she is "always SOB". She has minimal cough- non prod.   all symptoms better to her satisfaction while on singulair except for breathing 50% better at best / cough is mostly daytime and not noct  Breathing issues daily since 2013 worse with hot humid weather/ not noct  Has cpap not using and daytime drowsy mild Reproducible doe x 50 ft / has to lean on cart at HT  rec Restart singulair 10 mg daily    08/27/2014 f/u ov re obesity/ sob/ cr  with cough better ? Cough variant asthma with supra nl flows including [id number] while on singulair only / erv 56% Chief Complaint  Patient presents with  . Follow-up    Pt here to discuss PFT results. Pt states that SOB is better while taking singulair.     Still nasal congestion but also not as bad. No longer on any gerd rx or cough suppression   No obvious day to day or daytime variabilty or assoc  cp or chest tightness, subjective wheeze overt  hb symptoms. No unusual exp hx or h/o childhood pna/ asthma or knowledge of premature birth.  Sleeping ok without nocturnal  or early am exacerbation  of respiratory  c/o's or need for noct saba. Also denies any obvious fluctuation of symptoms with weather or environmental changes or other aggravating or alleviating factors except as outlined above   Current Medications, Allergies, Complete Past Medical History, Past Surgical History,  Family History, and Social History were reviewed in [employer] record.  ROS  The following are not active complaints unless bolded sore throat, dysphagia, dental problems, itching, sneezing,  nasal congestion or excess/ purulent secretions, ear ache,   fever, chills, sweats, unintended wt loss, pleuritic or exertional cp, hemoptysis,  orthopnea pnd or leg swelling, presyncope, palpitations, heartburn, abdominal pain, anorexia, nausea, vomiting, diarrhea  or change in bowel or urinary habits, change in stools or urine, dysuria,hematuria,  rash, arthralgias, visual complaints, headache, numbness weakness or ataxia or problems with walking or coordination,  change in mood/affect or memory.                  Objective:   Physical Exam  amb  wf  nad    04/22/13        Wt 205  >   06/18/2014   183 > 08/27/2014   174  Wt Readings from Last 3 Encounters:  04/08/13 210 lb (95.255 kg)  12/26/12 202 lb (91.627 kg)  06/27/12 191 lb (86.637 kg)      HEENT: nl dentition, turbinates, and orophanx. Nl external ear canals without cough reflex   NECK :  without JVD/Nodes/TM/ nl carotid upstrokes bilaterally   LUNGS: no acc muscle use, clear to A and P bilaterally without cough on insp or exp maneuvers   CV:  RRR  no s3 or murmur or increase in P2, no edema   ABD:  soft and nontender with nl excursion in the supine position. No bruits or organomegaly, bowel sounds nl  MS:  warm without deformities, calf tenderness, cyanosis or clubbing  SKIN: warm and dry without lesions    NEURO:  alert, approp, no deficits     CXR PA and Lateral:   06/18/2014 :     I personally reviewed images and agree with radiology impression as follows:    No active cardiopulmonary disease.  CBC 06/11/14 neg Eos    Assessment & Plan:

## 2014-08-29 ENCOUNTER — Encounter: Payer: Self-pay | Admitting: Internal Medicine

## 2014-08-29 DIAGNOSIS — E669 Obesity, unspecified: Secondary | ICD-10-CM | POA: Insufficient documentation

## 2014-08-29 NOTE — Assessment & Plan Note (Signed)
Body mass index is 30.83 kg/(m^2).  No results found for: TSH   Contributing to gerd tendency esp when also coughing due to high intraabd pressures/low erv/  doe/reviewed need  achieve and maintain neg calorie balance > defer f/u primary care including intermittently monitoring thyroid status

## 2014-08-29 NOTE — Assessment & Plan Note (Addendum)
-   sinus CT  04/08/2013 > Air-fluid level within maxillary sinus bilaterally > rx omnicef x10 days only   - allergy profile 04/22/2013 >  Eos 2.5%,   IgE 1.5 with neg RAST >    singulair trial  started 04/23/14, worse off it 06/18/14 > restarted > better 08/27/2014  - ENT  04/22/2013 referral > Never done   Regardless of whether this is uacs vs cough variant asthma (favor the former) >> definitely Adequate control on present rx, reviewed > no change in rx needed  > if continue to have flares on singulair strongly rec formal ent eval

## 2014-08-29 NOTE — Assessment & Plan Note (Signed)
-   04/08/2013  Walked RA x 3 laps @ 185 ft each stopped due to  End of study no desat or sob  - 06/18/2014  Walked RA x 3 laps @ 185 ft each stopped due to  End of study, fast pace, min sob sat 92% at end  - PFT's  08/27/2014  FEV1 3.46  (131 % ) ratio 87  p 0  % improvement from saba with DLCO  106  % corrects to 96 % for alv volume with nl [id number] also while on singulair and erv 56%   I had an extended final summary discussion with the patient reviewing all relevant studies completed to date and  lasting 15 to 20 minutes of a 25 minute visit on the following issues:    1) no evidence of any limiting airflow obst to explain doe and could not reproduce it here  2) only abn is low erv c/w obesity (see sep a/p)  3) no pulmonary f/u needed with refills per primary care if doing well  4) Each maintenance medication was reviewed in detail including most importantly the difference between maintenance and as needed and under what circumstances the prns are to be used.  Please see instructions for details which were reviewed in writing and the patient given a copy.

## 2014-11-19 DIAGNOSIS — R079 Chest pain, unspecified: Secondary | ICD-10-CM | POA: Insufficient documentation

## 2014-12-08 ENCOUNTER — Telehealth: Payer: Self-pay | Admitting: *Deleted

## 2014-12-08 MED ORDER — TAMOXIFEN CITRATE 20 MG PO TABS: 20.0000 mg | ORAL_TABLET | Freq: Every day | ORAL | Status: DC

## 2014-12-08 NOTE — Telephone Encounter (Signed)
Escribed

## 2014-12-17 ENCOUNTER — Ambulatory Visit: Payer: Self-pay | Admitting: Internal Medicine

## 2014-12-17 ENCOUNTER — Other Ambulatory Visit: Payer: Self-pay

## 2014-12-18 ENCOUNTER — Encounter: Payer: Self-pay | Admitting: *Deleted

## 2014-12-18 ENCOUNTER — Other Ambulatory Visit: Payer: Self-pay | Admitting: *Deleted

## 2014-12-18 DIAGNOSIS — Z853 Personal history of malignant neoplasm of breast: Secondary | ICD-10-CM

## 2014-12-19 ENCOUNTER — Inpatient Hospital Stay: Payer: Federal, State, Local not specified - PPO | Attending: Internal Medicine

## 2014-12-19 ENCOUNTER — Inpatient Hospital Stay (HOSPITAL_BASED_OUTPATIENT_CLINIC_OR_DEPARTMENT_OTHER): Payer: Federal, State, Local not specified - PPO | Admitting: Internal Medicine

## 2014-12-19 VITALS — BP 114/67 | HR 74 | Temp 95.5°F | Resp 18 | Wt 186.3 lb

## 2014-12-19 DIAGNOSIS — T451X5A Adverse effect of antineoplastic and immunosuppressive drugs, initial encounter: Secondary | ICD-10-CM

## 2014-12-19 DIAGNOSIS — R109 Unspecified abdominal pain: Secondary | ICD-10-CM | POA: Insufficient documentation

## 2014-12-19 DIAGNOSIS — Z87442 Personal history of urinary calculi: Secondary | ICD-10-CM

## 2014-12-19 DIAGNOSIS — C50412 Malignant neoplasm of upper-outer quadrant of left female breast: Secondary | ICD-10-CM | POA: Insufficient documentation

## 2014-12-19 DIAGNOSIS — Z79899 Other long term (current) drug therapy: Secondary | ICD-10-CM | POA: Insufficient documentation

## 2014-12-19 DIAGNOSIS — R232 Flushing: Secondary | ICD-10-CM

## 2014-12-19 DIAGNOSIS — Z87891 Personal history of nicotine dependence: Secondary | ICD-10-CM

## 2014-12-19 DIAGNOSIS — Z23 Encounter for immunization: Secondary | ICD-10-CM | POA: Diagnosis not present

## 2014-12-19 DIAGNOSIS — Z170 Estrogen receptor positive status [ER+]: Secondary | ICD-10-CM

## 2014-12-19 DIAGNOSIS — G4730 Sleep apnea, unspecified: Secondary | ICD-10-CM

## 2014-12-19 DIAGNOSIS — Z800 Family history of malignant neoplasm of digestive organs: Secondary | ICD-10-CM | POA: Diagnosis not present

## 2014-12-19 DIAGNOSIS — F419 Anxiety disorder, unspecified: Secondary | ICD-10-CM

## 2014-12-19 DIAGNOSIS — K219 Gastro-esophageal reflux disease without esophagitis: Secondary | ICD-10-CM | POA: Diagnosis not present

## 2014-12-19 DIAGNOSIS — Z79810 Long term (current) use of selective estrogen receptor modulators (SERMs): Secondary | ICD-10-CM | POA: Insufficient documentation

## 2014-12-19 DIAGNOSIS — Z299 Encounter for prophylactic measures, unspecified: Secondary | ICD-10-CM

## 2014-12-19 DIAGNOSIS — Z853 Personal history of malignant neoplasm of breast: Secondary | ICD-10-CM

## 2014-12-19 LAB — CBC WITH DIFFERENTIAL/PLATELET
Basophils Absolute: 0 10*3/uL (ref 0–0.1)
Basophils Relative: 1 %
Eosinophils Absolute: 0 10*3/uL (ref 0–0.7)
Eosinophils Relative: 1 %
HCT: 40.2 % (ref 35.0–47.0)
Hemoglobin: 13.5 g/dL (ref 12.0–16.0)
Lymphocytes Relative: 32 %
Lymphs Abs: 2.4 10*3/uL (ref 1.0–3.6)
MCH: 31.7 pg (ref 26.0–34.0)
MCHC: 33.5 g/dL (ref 32.0–36.0)
MCV: 94.5 fL (ref 80.0–100.0)
Monocytes Absolute: 0.4 10*3/uL (ref 0.2–0.9)
Monocytes Relative: 6 %
Neutro Abs: 4.5 10*3/uL (ref 1.4–6.5)
Neutrophils Relative %: 60 %
Platelets: 272 10*3/uL (ref 150–440)
RBC: 4.25 MIL/uL (ref 3.80–5.20)
RDW: 12.6 % (ref 11.5–14.5)
WBC: 7.4 10*3/uL (ref 3.6–11.0)

## 2014-12-19 LAB — CREATININE, SERUM
Creatinine, Ser: 0.7 mg/dL (ref 0.44–1.00)
GFR calc Af Amer: >60 mL/min (ref >60)
GFR calc non Af Amer: >60 mL/min (ref >60)

## 2014-12-19 LAB — HEPATIC FUNCTION PANEL
ALT: 24 U/L (ref 14–54)
AST: 21 U/L (ref 15–41)
Albumin: 4 g/dL (ref 3.5–5.0)
Alkaline Phosphatase: 81 U/L (ref 38–126)
Bilirubin, Direct: <0.1 mg/dL — ABNORMAL LOW (ref 0.1–0.5)
Total Bilirubin: 0.4 mg/dL (ref 0.3–1.2)
Total Protein: 7 g/dL (ref 6.5–8.1)

## 2014-12-19 MED ORDER — VENLAFAXINE HCL 37.5 MG PO TABS: 37.5000 mg | ORAL_TABLET | Freq: Two times a day (BID) | ORAL | Status: DC

## 2014-12-19 MED ORDER — INFLUENZA VAC SPLIT QUAD 0.5 ML IM SUSY
0.5000 mL | PREFILLED_SYRINGE | Freq: Once | INTRAMUSCULAR | Status: AC
  Administered 2014-12-19: 0.5 mL via INTRAMUSCULAR

## 2014-12-19 NOTE — Progress Notes (Signed)
[facility] OFFICE PROGRESS NOTE  Patient Care Team: No Pcp Per Patient as PCP - General (General Practice) Robert Bellow, MD (General Surgery) [name] Dear, MD (Family Medicine)   SUMMARY OF ONCOLOGIC HISTORY:  # JAN 2014- LEFT BREAST CA IDC; pT1c (1.2cm) pN0 [Stage I] ER/PR > 90%; her 2 Neu POS; AC x4- Taxol-Herceptin; Intol to AI; March 2015- Started TAM  # DEC 2016- Start Effexor   # Right oopherectomy; Left intact ovary-intact  INTERVAL HISTORY:  This is my first interaction with the patient since I joined the practice September 2016. I reviewed the patient's prior charts/pertinent labs/imaging in detail; findings are summarized above.   A very pleasant 54 year old female patient with above history of stage I breast cancer triple positive currently on adjuvant tamoxifen is here for follow-up.  Patient complains of significant hot flashes especially in the last 3-4 months. She has stopped taking tamoxifen- the last few weeks the hot flashes have improved. However she is nervous.   In general appetite is good. Denies any weight loss. She does however complain of a right-sided flank pain lasted for a few days currently resolved. [Prior history of kidney stones- however does not appear similar to the pain.]  No headaches or vision changes.   REVIEW OF SYSTEMS:  A complete 10 point review of system is done which is negative except mentioned above/history of present illness.   PAST MEDICAL HISTORY :  Past Medical History  Diagnosis Date  . Breast CA ([place])   . Anxiety   . GERD (gastroesophageal reflux disease)   . Pulmonary hypertension ([place]) 2013  . Irritable bowel syndrome   . Endometriosis 2001  . Sleep apnea 2007  . Sleep apnea 2007  . Malignant neoplasm of upper-outer quadrant of female breast ([place]) 01/30/2012    Left breast, Invasive mammary cancer, no special type: T1c,N0,M0; ER 90%, PR 90%, no amplification of HER-2/neu, aromatase inhibitors  discontinued September 2015 secondary to joint symptoms, tamoxifen initiated by Dr. Ma Hillock.Marland Kitchen    PAST SURGICAL HISTORY :   Past Surgical History  Procedure Laterality Date  . Breast enhancement surgery  2014,05/2013  . Port a cath revision  2014  . Dilation and curettage of uterus  2011  . Ovary surgery Right 2001  . Nissen fundoplication  0258  . Tonsillectomy  1969  . Hernia repair  1997  . Colonoscopy  2012  . Right heart cath  2013  . Exploratory laparotomy  2001  . Facial cosmetic surgery  2015  . Breast surgery Left 01/30/2012    Wide excision, sentinel node biopsy  . Breast surgery Left 02/09/2012    Left breast reduction/reconstruction: Nicholaus Bloom, MD.  . Breast surgery Left December 26, 2012    ultrasound-guided biopsy, post surgical change.    FAMILY HISTORY :   Family History  Problem Relation Age of Onset  . Colon cancer Paternal Grandfather     SOCIAL HISTORY:   Social History  Substance Use Topics  . Smoking status: Former Smoker -- 0.10 packs/day for 25 years    Types: Cigarettes    Quit date: 01/11/2007  . Smokeless tobacco: Never Used     Comment: "social smoker" per pt.  . Alcohol Use: No    ALLERGIES:  is allergic to augmentin.  MEDICATIONS:  Current Outpatient Prescriptions  Medication Sig Dispense Refill  . acyclovir (ZOVIRAX) 400 MG tablet     . montelukast (SINGULAIR) 10 MG tablet Take 1 tablet (10 mg total)  by mouth at bedtime. 30 tablet 11  . naproxen sodium (ANAPROX) 220 MG tablet Take 220 mg by mouth 2 (two) times daily as needed.    . sertraline (ZOLOFT) 100 MG tablet Take 100 mg by mouth daily.    . tamoxifen (NOLVADEX) 20 MG tablet Take 1 tablet (20 mg total) by mouth daily. 30 tablet 0  . potassium chloride SA (K-DUR,KLOR-CON) 20 MEQ tablet Take 1 tablet (20 mEq total) by mouth daily. 8 tablet 0  . venlafaxine (EFFEXOR) 37.5 MG tablet Take 1 tablet (37.5 mg total) by mouth 2 (two) times daily. 60 tablet 4   No current  facility-administered medications for this visit.    PHYSICAL EXAMINATION: ECOG PERFORMANCE STATUS: 0 - Asymptomatic  BP 114/67 mmHg  Pulse 74  Temp(Src) 95.5 F (35.3 C) (Tympanic)  Resp 18  Wt 186 lb 4.6 oz (84.5 kg)  Filed Weights   12/19/14 1524  Weight: 186 lb 4.6 oz (84.5 kg)    GENERAL: Well-nourished well-developed; Alert, no distress and comfortable.   Alone.  EYES: no pallor or icterus OROPHARYNX: no thrush or ulceration; good dentition  NECK: supple, no masses felt LYMPH:  no palpable lymphadenopathy in the cervical, axillary or inguinal regions LUNGS: clear to auscultation and  No wheeze or crackles HEART/CVS: regular rate & rhythm and no murmurs; No lower extremity edema ABDOMEN:abdomen soft, non-tender and normal bowel sounds Musculoskeletal:no cyanosis of digits and no clubbing  PSYCH: alert & oriented x 3 with fluent speech NEURO: no focal motor/sensory deficits SKIN:  no rashes or significant lesions  LABORATORY DATA:  I have reviewed the data as listed    Component Value Date/Time   NA 137 06/11/2014 1416   NA 140 09/25/2013 1425   K 3.5 06/25/2014 1519   K 4.0 09/25/2013 1425   CL 106 06/11/2014 1416   CL 103 09/25/2013 1425   CO2 26 06/11/2014 1416   CO2 29 09/25/2013 1425   GLUCOSE 97 06/11/2014 1416   GLUCOSE 92 09/25/2013 1425   BUN 11 06/11/2014 1416   BUN 17 09/25/2013 1425   CREATININE 0.70 12/19/2014 1507   CREATININE 0.87 09/25/2013 1425   CALCIUM 8.7* 06/11/2014 1416   CALCIUM 10.1 09/25/2013 1425   PROT 7.0 12/19/2014 1507   PROT 7.2 09/25/2013 1425   ALBUMIN 4.0 12/19/2014 1507   ALBUMIN 3.9 09/25/2013 1425   AST 21 12/19/2014 1507   AST 30 09/25/2013 1425   ALT 24 12/19/2014 1507   ALT 55 09/25/2013 1425   ALKPHOS 81 12/19/2014 1507   ALKPHOS 154* 09/25/2013 1425   BILITOT 0.4 12/19/2014 1507   BILITOT 0.4 09/25/2013 1425   GFRNONAA >60 12/19/2014 1507   GFRNONAA >60 09/25/2013 1425   GFRAA >60 12/19/2014 1507   GFRAA  >60 09/25/2013 1425    No results found for: SPEP, UPEP  Lab Results  Component Value Date   WBC 7.4 12/19/2014   NEUTROABS 4.5 12/19/2014   HGB 13.5 12/19/2014   HCT 40.2 12/19/2014   MCV 94.5 12/19/2014   PLT 272 12/19/2014      Chemistry      Component Value Date/Time   NA 137 06/11/2014 1416   NA 140 09/25/2013 1425   K 3.5 06/25/2014 1519   K 4.0 09/25/2013 1425   CL 106 06/11/2014 1416   CL 103 09/25/2013 1425   CO2 26 06/11/2014 1416   CO2 29 09/25/2013 1425   BUN 11 06/11/2014 1416   BUN 17 09/25/2013 1425  CREATININE 0.70 12/19/2014 1507   CREATININE 0.87 09/25/2013 1425      Component Value Date/Time   CALCIUM 8.7* 06/11/2014 1416   CALCIUM 10.1 09/25/2013 1425   ALKPHOS 81 12/19/2014 1507   ALKPHOS 154* 09/25/2013 1425   AST 21 12/19/2014 1507   AST 30 09/25/2013 1425   ALT 24 12/19/2014 1507   ALT 55 09/25/2013 1425   BILITOT 0.4 12/19/2014 1507   BILITOT 0.4 09/25/2013 1425       RADIOGRAPHIC STUDIES: I have personally reviewed the radiological images as listed and agreed with the findings in the report. No results found.   ASSESSMENT & PLAN:   # stage I ER/PR positive HER-2/neu positive breast cancer currently on adjuvant tamoxifen. Clinically no concerns for recurrence noted. Patient tolerating tamoxifen well except for severe hot flashes [see discussion below]. Continue tamoxifen for now; new refills for tamoxifen given.    # hot flashes-grade 2-3. Recommend addition of Effexor. Potential side effects discussed. Patient agrees to go on Effexor. New prescription given.  # I recommend a follow-up in approximately 3 months. No labs needed. I reviewed the last in today- normal CBC; creatinine. Tumor markers pending.  # 25 minutes face-to-face with the patient discussing the above plan of care; more than 50% of time spent on prognosis/ natural history; counseling and coordination.     Cammie Sickle, MD 12/19/2014 3:59 PM

## 2014-12-19 NOTE — Progress Notes (Signed)
Patient states she has had some left sided tightness in her chest and SOB.  States she saw cardiologist and everything checked out okay.  States her tamoxifen prescription ran out a few weeks ago and she has not taken it.  States she is taking a break from it. States she is having extreme hot flashes to where she wakes up and her clothes are wet.  Also states she is having pain in her right flank area.  States she has not felt well for past 6 months.

## 2014-12-20 LAB — CANCER ANTIGEN 27.29: CA 27.29: 14.3 U/mL (ref 0.0–38.6)

## 2015-01-14 ENCOUNTER — Other Ambulatory Visit: Payer: Self-pay | Admitting: Internal Medicine

## 2015-01-28 DIAGNOSIS — T50905A Adverse effect of unspecified drugs, medicaments and biological substances, initial encounter: Secondary | ICD-10-CM

## 2015-01-28 DIAGNOSIS — R232 Flushing: Secondary | ICD-10-CM | POA: Insufficient documentation

## 2015-01-28 DIAGNOSIS — M169 Osteoarthritis of hip, unspecified: Secondary | ICD-10-CM | POA: Insufficient documentation

## 2015-01-28 DIAGNOSIS — J45909 Unspecified asthma, uncomplicated: Secondary | ICD-10-CM | POA: Insufficient documentation

## 2015-01-29 DIAGNOSIS — IMO0002 Reserved for concepts with insufficient information to code with codable children: Secondary | ICD-10-CM | POA: Insufficient documentation

## 2015-03-02 ENCOUNTER — Other Ambulatory Visit: Payer: Self-pay | Admitting: Orthopedic Surgery

## 2015-03-02 DIAGNOSIS — M1612 Unilateral primary osteoarthritis, left hip: Secondary | ICD-10-CM

## 2015-03-02 DIAGNOSIS — M76892 Other specified enthesopathies of left lower limb, excluding foot: Secondary | ICD-10-CM

## 2015-03-10 DIAGNOSIS — R300 Dysuria: Secondary | ICD-10-CM | POA: Insufficient documentation

## 2015-03-18 ENCOUNTER — Inpatient Hospital Stay: Payer: Federal, State, Local not specified - PPO | Attending: Internal Medicine | Admitting: Internal Medicine

## 2015-03-18 VITALS — BP 126/72 | HR 92 | Temp 98.2°F | Ht 63.0 in | Wt 193.6 lb

## 2015-03-18 DIAGNOSIS — M791 Myalgia: Secondary | ICD-10-CM

## 2015-03-18 DIAGNOSIS — C50912 Malignant neoplasm of unspecified site of left female breast: Secondary | ICD-10-CM

## 2015-03-18 DIAGNOSIS — R232 Flushing: Secondary | ICD-10-CM | POA: Diagnosis not present

## 2015-03-18 DIAGNOSIS — R0681 Apnea, not elsewhere classified: Secondary | ICD-10-CM | POA: Insufficient documentation

## 2015-03-18 DIAGNOSIS — C50919 Malignant neoplasm of unspecified site of unspecified female breast: Secondary | ICD-10-CM | POA: Insufficient documentation

## 2015-03-18 DIAGNOSIS — Z90721 Acquired absence of ovaries, unilateral: Secondary | ICD-10-CM | POA: Diagnosis not present

## 2015-03-18 DIAGNOSIS — Z87891 Personal history of nicotine dependence: Secondary | ICD-10-CM | POA: Diagnosis not present

## 2015-03-18 DIAGNOSIS — I272 Other secondary pulmonary hypertension: Secondary | ICD-10-CM | POA: Diagnosis not present

## 2015-03-18 DIAGNOSIS — K219 Gastro-esophageal reflux disease without esophagitis: Secondary | ICD-10-CM

## 2015-03-18 DIAGNOSIS — Z79810 Long term (current) use of selective estrogen receptor modulators (SERMs): Secondary | ICD-10-CM

## 2015-03-18 DIAGNOSIS — E288 Other ovarian dysfunction: Secondary | ICD-10-CM

## 2015-03-18 DIAGNOSIS — G4730 Sleep apnea, unspecified: Secondary | ICD-10-CM | POA: Diagnosis not present

## 2015-03-18 DIAGNOSIS — C50412 Malignant neoplasm of upper-outer quadrant of left female breast: Secondary | ICD-10-CM

## 2015-03-18 DIAGNOSIS — E559 Vitamin D deficiency, unspecified: Secondary | ICD-10-CM | POA: Diagnosis not present

## 2015-03-18 DIAGNOSIS — R5383 Other fatigue: Secondary | ICD-10-CM

## 2015-03-18 DIAGNOSIS — F419 Anxiety disorder, unspecified: Secondary | ICD-10-CM | POA: Insufficient documentation

## 2015-03-18 DIAGNOSIS — G43909 Migraine, unspecified, not intractable, without status migrainosus: Secondary | ICD-10-CM | POA: Insufficient documentation

## 2015-03-18 DIAGNOSIS — K589 Irritable bowel syndrome without diarrhea: Secondary | ICD-10-CM | POA: Diagnosis not present

## 2015-03-18 DIAGNOSIS — Z79899 Other long term (current) drug therapy: Secondary | ICD-10-CM | POA: Diagnosis not present

## 2015-03-18 DIAGNOSIS — M1990 Unspecified osteoarthritis, unspecified site: Secondary | ICD-10-CM | POA: Insufficient documentation

## 2015-03-18 DIAGNOSIS — E2839 Other primary ovarian failure: Secondary | ICD-10-CM | POA: Insufficient documentation

## 2015-03-18 DIAGNOSIS — Z170 Estrogen receptor positive status [ER+]: Secondary | ICD-10-CM | POA: Diagnosis not present

## 2015-03-18 MED ORDER — VENLAFAXINE HCL 75 MG PO TABS: 75.0000 mg | ORAL_TABLET | Freq: Two times a day (BID) | ORAL | Status: DC

## 2015-03-18 NOTE — Progress Notes (Signed)
[facility] OFFICE PROGRESS NOTE  Patient Care Team: No Pcp Per Patient as PCP - General (General Practice) Robert Bellow, MD (General Surgery) [name] Dear, MD (Family Medicine)   SUMMARY OF ONCOLOGIC HISTORY:  # JAN 2014- LEFT BREAST CA IDC; pT1c (1.2cm) pN0 [Stage I] ER/PR > 90%; her 2 Neu POS; AC x4- Taxol-Herceptin; Intol to AI; March 2015- Started TAM  # DEC 2016- Start Effexor   # Right oopherectomy; Left intact ovary-intact  INTERVAL HISTORY:  A very pleasant 55 year old female patient with above history of stage I breast cancer triple positive currently on adjuvant tamoxifen is here for follow-up.   Patient has been on Effexor for the last 3 months; noted to have some improvement in the hot flashes. However PCP increased Effexor to  100; but seems to be helping better. But the hot flashes have not completely resolved.  Patient complains of vague bodyaches muscle pains. She also complains of extreme fatigue. No headaches or vision changes.  REVIEW OF SYSTEMS:  A complete 10 point review of system is done which is negative except mentioned above/history of present illness.   PAST MEDICAL HISTORY :  Past Medical History  Diagnosis Date  . Breast CA ([place])   . Anxiety   . GERD (gastroesophageal reflux disease)   . Pulmonary hypertension ([place]) 2013  . Irritable bowel syndrome   . Endometriosis 2001  . Sleep apnea 2007  . Sleep apnea 2007  . Malignant neoplasm of upper-outer quadrant of female breast ([place]) 01/30/2012    Left breast, Invasive mammary cancer, no special type: T1c,N0,M0; ER 90%, PR 90%, no amplification of HER-2/neu, aromatase inhibitors discontinued September 2015 secondary to joint symptoms, tamoxifen initiated by Dr. Ma Hillock.Marland Kitchen    PAST SURGICAL HISTORY :   Past Surgical History  Procedure Laterality Date  . Breast enhancement surgery  2014,05/2013  . Port a cath revision  2014  . Dilation and curettage of uterus  2011  . Ovary surgery  Right 2001  . Nissen fundoplication  2706  . Tonsillectomy  1969  . Hernia repair  1997  . Colonoscopy  2012  . Right heart cath  2013  . Exploratory laparotomy  2001  . Facial cosmetic surgery  2015  . Breast surgery Left 01/30/2012    Wide excision, sentinel node biopsy  . Breast surgery Left 02/09/2012    Left breast reduction/reconstruction: Nicholaus Bloom, MD.  . Breast surgery Left December 26, 2012    ultrasound-guided biopsy, post surgical change.    FAMILY HISTORY :   Family History  Problem Relation Age of Onset  . Colon cancer Paternal Grandfather     SOCIAL HISTORY:   Social History  Substance Use Topics  . Smoking status: Former Smoker -- 0.10 packs/day for 25 years    Types: Cigarettes    Quit date: 01/11/2007  . Smokeless tobacco: Never Used     Comment: "social smoker" per pt.  . Alcohol Use: No    ALLERGIES:  is allergic to augmentin.  MEDICATIONS:  Current Outpatient Prescriptions  Medication Sig Dispense Refill  . acyclovir (ZOVIRAX) 400 MG tablet     . albuterol (PROAIR HFA) 108 (90 Base) MCG/ACT inhaler Inhale into the lungs.    . diclofenac (VOLTAREN) 75 MG EC tablet     . docusate sodium (COLACE) 100 MG capsule Take by mouth.    . montelukast (SINGULAIR) 10 MG tablet Take by mouth.    . naproxen sodium (ANAPROX) 220 MG tablet  Take 220 mg by mouth 2 (two) times daily as needed.    . nitrofurantoin, macrocrystal-monohydrate, (MACROBID) 100 MG capsule Take by mouth.    . tamoxifen (NOLVADEX) 20 MG tablet TAKE ONE TABLET BY MOUTH ONCE DAILY 30 tablet 0  . venlafaxine (EFFEXOR) 75 MG tablet Take 1 tablet (75 mg total) by mouth 2 (two) times daily with a meal. 60 tablet 3  . potassium chloride SA (K-DUR,KLOR-CON) 20 MEQ tablet Take 1 tablet (20 mEq total) by mouth daily. 8 tablet 0   No current facility-administered medications for this visit.    PHYSICAL EXAMINATION: ECOG PERFORMANCE STATUS: 0 - Asymptomatic  BP 126/72 mmHg  Pulse 92  Temp(Src)  98.2 F (36.8 C) (Oral)  Ht _0  (1.6 m)  Wt 193 lb 9 oz (87.8 kg)  BMI 34.30 kg/m2  SpO2 97%  Filed Weights   03/18/15 1558  Weight: 193 lb 9 oz (87.8 kg)    GENERAL: Well-nourished well-developed; Alert, no distress and comfortable.   Alone.  EYES: no pallor or icterus OROPHARYNX: no thrush or ulceration; good dentition  NECK: supple, no masses felt LYMPH:  no palpable lymphadenopathy in the cervical, axillary or inguinal regions LUNGS: clear to auscultation and  No wheeze or crackles HEART/CVS: regular rate & rhythm and no murmurs; No lower extremity edema ABDOMEN:abdomen soft, non-tender and normal bowel sounds Musculoskeletal:no cyanosis of digits and no clubbing  PSYCH: alert & oriented x 3 with fluent speech NEURO: no focal motor/sensory deficits SKIN:  no rashes or significant lesions  LABORATORY DATA:  I have reviewed the data as listed    Component Value Date/Time   NA 137 06/11/2014 1416   NA 140 09/25/2013 1425   K 3.5 06/25/2014 1519   K 4.0 09/25/2013 1425   CL 106 06/11/2014 1416   CL 103 09/25/2013 1425   CO2 26 06/11/2014 1416   CO2 29 09/25/2013 1425   GLUCOSE 97 06/11/2014 1416   GLUCOSE 92 09/25/2013 1425   BUN 11 06/11/2014 1416   BUN 17 09/25/2013 1425   CREATININE 0.70 12/19/2014 1507   CREATININE 0.87 09/25/2013 1425   CALCIUM 8.7* 06/11/2014 1416   CALCIUM 10.1 09/25/2013 1425   PROT 7.0 12/19/2014 1507   PROT 7.2 09/25/2013 1425   ALBUMIN 4.0 12/19/2014 1507   ALBUMIN 3.9 09/25/2013 1425   AST 21 12/19/2014 1507   AST 30 09/25/2013 1425   ALT 24 12/19/2014 1507   ALT 55 09/25/2013 1425   ALKPHOS 81 12/19/2014 1507   ALKPHOS 154* 09/25/2013 1425   BILITOT 0.4 12/19/2014 1507   BILITOT 0.4 09/25/2013 1425   GFRNONAA >60 12/19/2014 1507   GFRNONAA >60 09/25/2013 1425   GFRAA >60 12/19/2014 1507   GFRAA >60 09/25/2013 1425    No results found for: SPEP, UPEP  Lab Results  Component Value Date   WBC 7.4 12/19/2014   NEUTROABS  4.5 12/19/2014   HGB 13.5 12/19/2014   HCT 40.2 12/19/2014   MCV 94.5 12/19/2014   PLT 272 12/19/2014      Chemistry      Component Value Date/Time   NA 137 06/11/2014 1416   NA 140 09/25/2013 1425   K 3.5 06/25/2014 1519   K 4.0 09/25/2013 1425   CL 106 06/11/2014 1416   CL 103 09/25/2013 1425   CO2 26 06/11/2014 1416   CO2 29 09/25/2013 1425   BUN 11 06/11/2014 1416   BUN 17 09/25/2013 1425   CREATININE 0.70 12/19/2014 1507  CREATININE 0.87 09/25/2013 1425      Component Value Date/Time   CALCIUM 8.7* 06/11/2014 1416   CALCIUM 10.1 09/25/2013 1425   ALKPHOS 81 12/19/2014 1507   ALKPHOS 154* 09/25/2013 1425   AST 21 12/19/2014 1507   AST 30 09/25/2013 1425   ALT 24 12/19/2014 1507   ALT 55 09/25/2013 1425   BILITOT 0.4 12/19/2014 1507   BILITOT 0.4 09/25/2013 1425        ASSESSMENT & PLAN:   # stage I ER/PR positive HER-2/neu positive breast cancer currently on adjuvant tamoxifen. Clinically no concerns for recurrence noted. Patient tolerating tamoxifen well except for mild hot flashes [see discussion below]. Continue tamoxifen for now; new refills for tamoxifen given.    # hot flashes- grade 1-2. Recommend increasing the dose of Effexor to 150 [75 mg twice a day]. A newPrescription given.  # extreme fatigue and body aches- question vitamin D deficiency. Recommend vitamin D thousand units once a day  # recommend follow-up in 6 months with CBC CMP  # 15 minutes face-to-face with the patient discussing the above plan of care; more than 50% of time spent on prognosis/ natural history; counseling and coordination.     Cammie Sickle, MD 03/18/2015 4:12 PM

## 2015-03-19 ENCOUNTER — Ambulatory Visit: Payer: Self-pay | Admitting: Internal Medicine

## 2015-03-20 ENCOUNTER — Ambulatory Visit: Payer: Federal, State, Local not specified - PPO

## 2015-03-31 ENCOUNTER — Encounter: Payer: Self-pay | Admitting: *Deleted

## 2015-05-15 ENCOUNTER — Other Ambulatory Visit: Payer: Self-pay | Admitting: *Deleted

## 2015-05-15 MED ORDER — TAMOXIFEN CITRATE 20 MG PO TABS: 20.0000 mg | ORAL_TABLET | Freq: Every day | ORAL | Status: DC

## 2015-05-22 ENCOUNTER — Telehealth: Payer: Self-pay | Admitting: *Deleted

## 2015-05-22 NOTE — Telephone Encounter (Signed)
Filled tamoxifen 05/15/15

## 2015-06-15 ENCOUNTER — Other Ambulatory Visit: Payer: Self-pay | Admitting: Internal Medicine

## 2015-06-17 ENCOUNTER — Ambulatory Visit: Payer: Self-pay | Admitting: Radiation Oncology

## 2015-06-17 ENCOUNTER — Inpatient Hospital Stay: Admission: RE | Admit: 2015-06-17 | Payer: Self-pay | Source: Ambulatory Visit | Admitting: Radiation Oncology

## 2015-06-17 ENCOUNTER — Telehealth: Payer: Self-pay | Admitting: *Deleted

## 2015-06-17 NOTE — Telephone Encounter (Signed)
Per Dr Rogue Bussing it is OK to get vaccine. Returned call to [facility] left msg on VM

## 2015-06-17 NOTE — Telephone Encounter (Signed)
Called to ask if she can get Shingles vaccine ordered by PCP with her being on Tamoxifen

## 2015-06-22 ENCOUNTER — Other Ambulatory Visit: Payer: Self-pay | Admitting: Internal Medicine

## 2015-07-06 ENCOUNTER — Encounter: Payer: Self-pay | Admitting: *Deleted

## 2015-07-15 ENCOUNTER — Ambulatory Visit: Payer: Federal, State, Local not specified - PPO | Admitting: General Surgery

## 2015-07-16 ENCOUNTER — Other Ambulatory Visit: Payer: Self-pay | Admitting: *Deleted

## 2015-07-16 ENCOUNTER — Inpatient Hospital Stay
Admission: RE | Admit: 2015-07-16 | Discharge: 2015-07-16 | Disposition: A | Discharge status: Home / Self Care | Payer: Self-pay | Source: Ambulatory Visit | Attending: *Deleted | Admitting: *Deleted

## 2015-07-16 DIAGNOSIS — Z9289 Personal history of other medical treatment: Secondary | ICD-10-CM

## 2015-07-17 ENCOUNTER — Other Ambulatory Visit: Payer: Self-pay | Admitting: Internal Medicine

## 2015-07-17 ENCOUNTER — Other Ambulatory Visit: Payer: Self-pay

## 2015-07-17 DIAGNOSIS — C50412 Malignant neoplasm of upper-outer quadrant of left female breast: Secondary | ICD-10-CM

## 2015-07-28 ENCOUNTER — Ambulatory Visit: Payer: Federal, State, Local not specified - PPO

## 2015-07-28 ENCOUNTER — Encounter: Payer: Self-pay | Admitting: *Deleted

## 2015-07-28 DIAGNOSIS — Z853 Personal history of malignant neoplasm of breast: Secondary | ICD-10-CM | POA: Diagnosis not present

## 2015-07-29 ENCOUNTER — Ambulatory Visit
Admission: RE | Admit: 2015-07-29 | Discharge: 2015-07-29 | Disposition: A | Discharge status: Home / Self Care | Payer: Federal, State, Local not specified - PPO | Source: Ambulatory Visit | Attending: General Surgery | Admitting: General Surgery

## 2015-07-29 ENCOUNTER — Other Ambulatory Visit: Payer: Self-pay | Admitting: General Surgery

## 2015-07-29 DIAGNOSIS — C50412 Malignant neoplasm of upper-outer quadrant of left female breast: Secondary | ICD-10-CM

## 2015-07-29 DIAGNOSIS — Z853 Personal history of malignant neoplasm of breast: Secondary | ICD-10-CM | POA: Insufficient documentation

## 2015-08-04 ENCOUNTER — Encounter: Payer: Self-pay | Admitting: General Surgery

## 2015-08-04 ENCOUNTER — Ambulatory Visit (INDEPENDENT_AMBULATORY_CARE_PROVIDER_SITE_OTHER): Payer: Federal, State, Local not specified - PPO | Admitting: General Surgery

## 2015-08-04 VITALS — BP 132/72 | HR 74 | Resp 12 | Ht 66.0 in | Wt 192.0 lb

## 2015-08-04 DIAGNOSIS — Z853 Personal history of malignant neoplasm of breast: Secondary | ICD-10-CM | POA: Diagnosis not present

## 2015-08-04 NOTE — Patient Instructions (Signed)
The patient has been asked to return to the office in one year with a bilateral diagnostic mammogram. 

## 2015-08-04 NOTE — Progress Notes (Addendum)
Patient ID: Katrina Holland, female   DOB: [birth year], 55 y.o.   MRN: [id number]  Chief Complaint  Patient presents with  . Follow-up    mammogram    HPI Katrina Holland is a 55 y.o. female who presents for a breast evaluation. The most recent mammogram was done on 07/29/15.Marland Kitchen  Patient does perform regular self breast checks and gets regular mammograms done. Patient states she has some pain in her left breast. The pain moves all over her breast.  Usually associated with activity or direct pressure. "Toothache" in character.  The patient reports that she has had about a 20 pound weight gain over the last year and she is troubled in the some of this has settled in her chest. Attributes this to dietary discretion while traveling.  HPI  Past Medical History:  Diagnosis Date  . Anxiety   . Breast CA ([place])   . Endometriosis 2001  . GERD (gastroesophageal reflux disease)   . Irritable bowel syndrome   . Malignant neoplasm of upper-outer quadrant of female breast ([place]) 01/30/2012   Left breast, Invasive mammary cancer, no special type: T1c,N0,M0; ER 90%, PR 90%, no amplification of HER-2/neu, aromatase inhibitors discontinued September 2015 secondary to joint symptoms, tamoxifen initiated by Dr. Ma Hillock..  . Pulmonary hypertension ([place]) 2013  . Sleep apnea 2007  . Sleep apnea 2007    Past Surgical History:  Procedure Laterality Date  . BREAST BIOPSY Left 2013   neg  . BREAST BIOPSY Left 01/14   +  . BREAST BIOPSY Left 12/14   FNA   . BREAST ENHANCEMENT SURGERY  2014,05/2013  . BREAST SURGERY Left 01/30/2012   Wide excision, sentinel node biopsy  . BREAST SURGERY Left 02/09/2012   Left breast reduction/reconstruction: Nicholaus Bloom, MD.  . BREAST SURGERY Left December 26, 2012   ultrasound-guided biopsy, post surgical change.  . COLONOSCOPY  2012  . DILATION AND CURETTAGE OF UTERUS  2011  . EXPLORATORY LAPAROTOMY  2001  . FACIAL COSMETIC SURGERY  2015  . HERNIA REPAIR  1997  . NISSEN  FUNDOPLICATION  8921  . OVARY SURGERY Right 2001  . PORT A CATH REVISION  2014  . REDUCTION MAMMAPLASTY Left 2014  . REDUCTION MAMMAPLASTY Right 2015  . RIGHT HEART CATH  2013  . TONSILLECTOMY  1969    Family History  Problem Relation Age of Onset  . Colon cancer Paternal Grandfather   . Breast cancer Maternal Aunt 69  . Breast cancer Cousin 51  . Breast cancer Cousin 58  . Breast cancer Cousin 59    Social History Social History  Substance Use Topics  . Smoking status: Former Smoker    Packs/day: 0.10    Years: 25.00    Types: Cigarettes    Quit date: 01/11/2007  . Smokeless tobacco: Never Used     Comment: "social smoker" per pt.  . Alcohol use No    Allergies  Allergen Reactions  . Augmentin [Amoxicillin-Pot Clavulanate]     "makes me sick"    Current Outpatient Prescriptions  Medication Sig Dispense Refill  . acyclovir (ZOVIRAX) 400 MG tablet     . albuterol (PROAIR HFA) 108 (90 Base) MCG/ACT inhaler Inhale into the lungs.    . diclofenac (VOLTAREN) 75 MG EC tablet Take 75 mg by mouth 2 (two) times daily.     Marland Kitchen docusate sodium (COLACE) 100 MG capsule Take by mouth.    . montelukast (SINGULAIR) 10 MG tablet TAKE ONE TABLET BY MOUTH  AT BEDTIME 30 tablet 0  . naproxen sodium (ANAPROX) 220 MG tablet Take 220 mg by mouth 2 (two) times daily as needed.    . tamoxifen (NOLVADEX) 20 MG tablet Take 1 tablet (20 mg total) by mouth daily. 90 tablet 1  . venlafaxine (EFFEXOR) 75 MG tablet Take 1 tablet (75 mg total) by mouth 2 (two) times daily with a meal. 60 tablet 3   No current facility-administered medications for this visit.     Review of Systems Review of Systems  Constitutional: Negative.   Respiratory: Negative.   Cardiovascular: Negative.     Blood pressure 132/72, pulse 74, resp. rate 12, height '5\' 6"'  (1.676 m), weight 192 lb (87.1 kg).  Physical Exam Physical Exam  Constitutional: She is oriented to person, place, and time. She appears well-developed  and well-nourished.  Eyes: Conjunctivae are normal. No scleral icterus.  Neck: Neck supple.  Cardiovascular: Normal rate, regular rhythm and normal heart sounds.   Pulmonary/Chest: Effort normal and breath sounds normal. Right breast exhibits no inverted nipple, no mass, no nipple discharge, no skin change and no tenderness. Left breast exhibits no inverted nipple, no mass, no nipple discharge, no skin change and no tenderness.    Lymphadenopathy:    She has no cervical adenopathy.    She has no axillary adenopathy.  Neurological: She is alert and oriented to person, place, and time.  Skin: Skin is warm and dry.    Data Reviewed 07/29/2015 bilateral diagnostic mammograms reviewed. No interval change. BI-RADS-1.  Assessment    No evidence of recurrent breast cancer.  Weight gain secondary to dietary exuberance.    Plan    Patient recently underwent sleep apnea study. Had previously been tested and found the mask intolerable with her claustrophobia. Encouraged to review all options if she is indeed found to be desaturating at night to minimize progression of previously identified pulmonary hypertension.  Last mammogram was 5 years ago in [place]. She'll be returning to that facility.  Patient has been discouraged from making use of diclofenac and Naprosyn at the same time.  She'll continue tamoxifen which she is tolerating fairly well.   The patient has been asked to return to the office in one year with a bilateral diagnostic mammogram.  This information has been scribed by Gaspar Cola CMA. PCP:  Luane School 08/04/2015, 4:22 PM

## 2015-08-24 ENCOUNTER — Other Ambulatory Visit: Payer: Self-pay | Admitting: Internal Medicine

## 2015-09-23 ENCOUNTER — Other Ambulatory Visit: Payer: Self-pay | Admitting: Internal Medicine

## 2015-09-23 ENCOUNTER — Ambulatory Visit: Payer: Self-pay | Admitting: Internal Medicine

## 2015-09-23 ENCOUNTER — Inpatient Hospital Stay: Payer: Federal, State, Local not specified - PPO | Attending: Internal Medicine

## 2015-09-23 ENCOUNTER — Inpatient Hospital Stay (HOSPITAL_BASED_OUTPATIENT_CLINIC_OR_DEPARTMENT_OTHER): Payer: Federal, State, Local not specified - PPO | Admitting: Internal Medicine

## 2015-09-23 ENCOUNTER — Encounter: Payer: Self-pay | Admitting: Internal Medicine

## 2015-09-23 VITALS — BP 127/83 | HR 82 | Temp 97.9°F | Resp 18 | Ht 62.0 in | Wt 189.8 lb

## 2015-09-23 DIAGNOSIS — Z87891 Personal history of nicotine dependence: Secondary | ICD-10-CM

## 2015-09-23 DIAGNOSIS — G4730 Sleep apnea, unspecified: Secondary | ICD-10-CM | POA: Diagnosis not present

## 2015-09-23 DIAGNOSIS — Z803 Family history of malignant neoplasm of breast: Secondary | ICD-10-CM | POA: Insufficient documentation

## 2015-09-23 DIAGNOSIS — R05 Cough: Secondary | ICD-10-CM

## 2015-09-23 DIAGNOSIS — I272 Other secondary pulmonary hypertension: Secondary | ICD-10-CM

## 2015-09-23 DIAGNOSIS — Z170 Estrogen receptor positive status [ER+]: Secondary | ICD-10-CM

## 2015-09-23 DIAGNOSIS — K219 Gastro-esophageal reflux disease without esophagitis: Secondary | ICD-10-CM | POA: Insufficient documentation

## 2015-09-23 DIAGNOSIS — R232 Flushing: Secondary | ICD-10-CM | POA: Diagnosis not present

## 2015-09-23 DIAGNOSIS — Z79810 Long term (current) use of selective estrogen receptor modulators (SERMs): Secondary | ICD-10-CM | POA: Insufficient documentation

## 2015-09-23 DIAGNOSIS — Z79899 Other long term (current) drug therapy: Secondary | ICD-10-CM

## 2015-09-23 DIAGNOSIS — Z800 Family history of malignant neoplasm of digestive organs: Secondary | ICD-10-CM

## 2015-09-23 DIAGNOSIS — R058 Other specified cough: Secondary | ICD-10-CM

## 2015-09-23 DIAGNOSIS — C50812 Malignant neoplasm of overlapping sites of left female breast: Secondary | ICD-10-CM | POA: Insufficient documentation

## 2015-09-23 DIAGNOSIS — F419 Anxiety disorder, unspecified: Secondary | ICD-10-CM | POA: Insufficient documentation

## 2015-09-23 DIAGNOSIS — Z780 Asymptomatic menopausal state: Secondary | ICD-10-CM

## 2015-09-23 DIAGNOSIS — R5383 Other fatigue: Secondary | ICD-10-CM | POA: Diagnosis not present

## 2015-09-23 DIAGNOSIS — K589 Irritable bowel syndrome without diarrhea: Secondary | ICD-10-CM | POA: Insufficient documentation

## 2015-09-23 DIAGNOSIS — N809 Endometriosis, unspecified: Secondary | ICD-10-CM

## 2015-09-23 DIAGNOSIS — C50912 Malignant neoplasm of unspecified site of left female breast: Secondary | ICD-10-CM

## 2015-09-23 DIAGNOSIS — R0602 Shortness of breath: Secondary | ICD-10-CM

## 2015-09-23 LAB — COMPREHENSIVE METABOLIC PANEL
ALT: 28 U/L (ref 14–54)
AST: 28 U/L (ref 15–41)
Albumin: 4.1 g/dL (ref 3.5–5.0)
Alkaline Phosphatase: 72 U/L (ref 38–126)
Anion gap: 7 (ref 5–15)
BUN: 16 mg/dL (ref 6–20)
CO2: 23 mmol/L (ref 22–32)
Calcium: 9.1 mg/dL (ref 8.9–10.3)
Chloride: 107 mmol/L (ref 101–111)
Creatinine, Ser: 0.74 mg/dL (ref 0.44–1.00)
GFR calc Af Amer: >60 mL/min (ref >60)
GFR calc non Af Amer: >60 mL/min (ref >60)
Glucose, Bld: 142 mg/dL — ABNORMAL HIGH (ref 65–99)
Potassium: 3.9 mmol/L (ref 3.5–5.1)
Sodium: 137 mmol/L (ref 135–145)
Total Bilirubin: 0.4 mg/dL (ref 0.3–1.2)
Total Protein: 6.8 g/dL (ref 6.5–8.1)

## 2015-09-23 LAB — CBC WITH DIFFERENTIAL/PLATELET
Basophils Absolute: 0 10*3/uL (ref 0–0.1)
Basophils Relative: 1 %
Eosinophils Absolute: 0 10*3/uL (ref 0–0.7)
Eosinophils Relative: 1 %
HCT: 38.1 % (ref 35.0–47.0)
Hemoglobin: 13 g/dL (ref 12.0–16.0)
Lymphocytes Relative: 44 %
Lymphs Abs: 2 10*3/uL (ref 1.0–3.6)
MCH: 32.2 pg (ref 26.0–34.0)
MCHC: 34.1 g/dL (ref 32.0–36.0)
MCV: 94.2 fL (ref 80.0–100.0)
Monocytes Absolute: 0.3 10*3/uL (ref 0.2–0.9)
Monocytes Relative: 7 %
Neutro Abs: 2.3 10*3/uL (ref 1.4–6.5)
Neutrophils Relative %: 47 %
Platelets: 256 10*3/uL (ref 150–440)
RBC: 4.05 MIL/uL (ref 3.80–5.20)
RDW: 12.7 % (ref 11.5–14.5)
WBC: 4.7 10*3/uL (ref 3.6–11.0)

## 2015-09-23 NOTE — Assessment & Plan Note (Addendum)
#  stage I ER/PR positive HER-2/neu positive breast cancer currently on adjuvant tamoxifen. Clinically no concerns for recurrence noted. Patient tolerating tamoxifen well except for mild hot flashes [see discussion below].  Multiple SEs- HOLD TAM; check hormones in 6 weeks;   # Dysnpea- ? Etiology; rec fu with PCP/pul.   # hot flashes- grade 1-2. On Effexor  # extreme fatigue and body aches- question vitamin D deficiency; recommend chondoitin/ glucosamine   # follow up in 7 weeks.

## 2015-09-23 NOTE — Progress Notes (Signed)
Pt reports she is always fatigued.  Sharp pains in left breast and right breast has throbbing pain.  Pt reports that at times lungs have pain.  Pt reports SOB always at rest and exertion  and tiredness.  Pt reports that is feels like an elephant sitting on her all the time.

## 2015-09-23 NOTE — Progress Notes (Signed)
[place] OFFICE PROGRESS NOTE  Patient Care Team: Glendon Axe, MD as PCP - General (Internal Medicine) Robert Bellow, MD (General Surgery) [name] Dear, MD (Inactive) (Family Medicine)   SUMMARY OF ONCOLOGIC HISTORY:  Oncology History   # JAN 2014- LEFT BREAST CA IDC; pT1c (1.2cm) pN0 [Stage I] ER/PR > 90%; her 2 Neu POS; AC x4- Taxol-Herceptin; Intol to AI; March 2015- Started TAM  # DEC 2016- Start Effexor   # Right oopherectomy; Left intact ovary-intact     Cancer of overlapping sites of left female breast ([place])   09/23/2015 Initial Diagnosis    Cancer of overlapping sites of left female breast [facility])       INTERVAL HISTORY:  A very pleasant 55 year old female patient with above history of stage I breast cancer triple positive currently on adjuvant tamoxifen is here for follow-up.   Patient has been on Effexor for the last 6 months; noted to have some improvement in the hot flashes. But the hot flashes have not completely resolved. SOB especially with exertion. Cough; dry- chronic. She also complains of extreme fatigue  Patient complains of vague bodyaches muscle pains. No headaches or vision changes.  REVIEW OF SYSTEMS:  A complete 10 point review of system is done which is negative except mentioned above/history of present illness.   PAST MEDICAL HISTORY :  Past Medical History:  Diagnosis Date  . Anxiety   . Breast CA ([place])   . Endometriosis 2001  . GERD (gastroesophageal reflux disease)   . Irritable bowel syndrome   . Malignant neoplasm of upper-outer quadrant of female breast ([place]) 01/30/2012   Left breast, Invasive mammary cancer, no special type: T1c,N0,M0; ER 90%, PR 90%, no amplification of HER-2/neu, aromatase inhibitors discontinued September 2015 secondary to joint symptoms, tamoxifen initiated by Dr. Ma Hillock..  . Pulmonary hypertension ([place]) 2013  . Sleep apnea 2007  . Sleep apnea 2007    PAST SURGICAL HISTORY :   Past Surgical  History:  Procedure Laterality Date  . BREAST BIOPSY Left 2013   neg  . BREAST BIOPSY Left 01/14   +  . BREAST BIOPSY Left 12/14   FNA   . BREAST ENHANCEMENT SURGERY  2014,05/2013  . BREAST SURGERY Left 01/30/2012   Wide excision, sentinel node biopsy  . BREAST SURGERY Left 02/09/2012   Left breast reduction/reconstruction: Nicholaus Bloom, MD.  . BREAST SURGERY Left December 26, 2012   ultrasound-guided biopsy, post surgical change.  . COLONOSCOPY  2012  . DILATION AND CURETTAGE OF UTERUS  2011  . EXPLORATORY LAPAROTOMY  2001  . FACIAL COSMETIC SURGERY  2015  . HERNIA REPAIR  1997  . NISSEN FUNDOPLICATION  7654  . OVARY SURGERY Right 2001  . PORT A CATH REVISION  2014  . REDUCTION MAMMAPLASTY Left 2014  . REDUCTION MAMMAPLASTY Right 2015  . RIGHT HEART CATH  2013  . TONSILLECTOMY  1969    FAMILY HISTORY :   Family History  Problem Relation Age of Onset  . Colon cancer Paternal Grandfather   . Breast cancer Maternal Aunt 13  . Breast cancer Cousin 36  . Breast cancer Cousin 69  . Breast cancer Cousin 28    SOCIAL HISTORY:   Social History  Substance Use Topics  . Smoking status: Former Smoker    Packs/day: 0.10    Years: 25.00    Types: Cigarettes    Quit date: 01/11/2007  . Smokeless tobacco: Never Used     Comment: "social  smoker" per pt.  . Alcohol use No    ALLERGIES:  is allergic to augmentin [amoxicillin-pot clavulanate].  MEDICATIONS:  Current Outpatient Prescriptions  Medication Sig Dispense Refill  . diclofenac (VOLTAREN) 75 MG EC tablet Take 75 mg by mouth 2 (two) times daily.     Marland Kitchen docusate sodium (COLACE) 100 MG capsule Take by mouth.    . tamoxifen (NOLVADEX) 20 MG tablet Take 1 tablet (20 mg total) by mouth daily. 90 tablet 1  . venlafaxine (EFFEXOR) 75 MG tablet TAKE ONE TABLET BY MOUTH TWICE DAILY WITH MEALS 60 tablet 3  . acyclovir (ZOVIRAX) 400 MG tablet     . albuterol (PROAIR HFA) 108 (90 Base) MCG/ACT inhaler Inhale into the lungs.    .  montelukast (SINGULAIR) 10 MG tablet TAKE ONE TABLET BY MOUTH AT BEDTIME (Patient not taking: Reported on 09/23/2015) 30 tablet 0   No current facility-administered medications for this visit.     PHYSICAL EXAMINATION: ECOG PERFORMANCE STATUS: 0 - Asymptomatic  BP 127/83 (BP Location: Right Arm, Patient Position: Sitting)   Pulse 82   Temp 97.9 F (36.6 C)   Resp 18   Ht '5\' 2"'  (1.575 m)   Wt 189 lb 12.8 oz (86.1 kg)   BMI 34.71 kg/m   Filed Weights   09/23/15 1053  Weight: 189 lb 12.8 oz (86.1 kg)    GENERAL: Well-nourished well-developed; Alert, no distress and comfortable.   Alone.  EYES: no pallor or icterus OROPHARYNX: no thrush or ulceration; good dentition  NECK: supple, no masses felt LYMPH:  no palpable lymphadenopathy in the cervical, axillary or inguinal regions LUNGS: clear to auscultation and  No wheeze or crackles HEART/CVS: regular rate & rhythm and no murmurs; No lower extremity edema ABDOMEN:abdomen soft, non-tender and normal bowel sounds Musculoskeletal:no cyanosis of digits and no clubbing  PSYCH: alert & oriented x 3 with fluent speech NEURO: no focal motor/sensory deficits SKIN:  no rashes or significant lesions  LABORATORY DATA:  I have reviewed the data as listed    Component Value Date/Time   NA 137 09/23/2015 1040   NA 140 09/25/2013 1425   K 3.9 09/23/2015 1040   K 4.0 09/25/2013 1425   CL 107 09/23/2015 1040   CL 103 09/25/2013 1425   CO2 23 09/23/2015 1040   CO2 29 09/25/2013 1425   GLUCOSE 142 (H) 09/23/2015 1040   GLUCOSE 92 09/25/2013 1425   BUN 16 09/23/2015 1040   BUN 17 09/25/2013 1425   CREATININE 0.74 09/23/2015 1040   CREATININE 0.87 09/25/2013 1425   CALCIUM 9.1 09/23/2015 1040   CALCIUM 10.1 09/25/2013 1425   PROT 6.8 09/23/2015 1040   PROT 7.2 09/25/2013 1425   ALBUMIN 4.1 09/23/2015 1040   ALBUMIN 3.9 09/25/2013 1425   AST 28 09/23/2015 1040   AST 30 09/25/2013 1425   ALT 28 09/23/2015 1040   ALT 55 09/25/2013 1425    ALKPHOS 72 09/23/2015 1040   ALKPHOS 154 (H) 09/25/2013 1425   BILITOT 0.4 09/23/2015 1040   BILITOT 0.4 09/25/2013 1425   GFRNONAA >60 09/23/2015 1040   GFRNONAA >60 09/25/2013 1425   GFRAA >60 09/23/2015 1040   GFRAA >60 09/25/2013 1425    No results found for: SPEP, UPEP  Lab Results  Component Value Date   WBC 4.7 09/23/2015   NEUTROABS 2.3 09/23/2015   HGB 13.0 09/23/2015   HCT 38.1 09/23/2015   MCV 94.2 09/23/2015   PLT 256 09/23/2015  Chemistry      Component Value Date/Time   NA 137 09/23/2015 1040   NA 140 09/25/2013 1425   K 3.9 09/23/2015 1040   K 4.0 09/25/2013 1425   CL 107 09/23/2015 1040   CL 103 09/25/2013 1425   CO2 23 09/23/2015 1040   CO2 29 09/25/2013 1425   BUN 16 09/23/2015 1040   BUN 17 09/25/2013 1425   CREATININE 0.74 09/23/2015 1040   CREATININE 0.87 09/25/2013 1425      Component Value Date/Time   CALCIUM 9.1 09/23/2015 1040   CALCIUM 10.1 09/25/2013 1425   ALKPHOS 72 09/23/2015 1040   ALKPHOS 154 (H) 09/25/2013 1425   AST 28 09/23/2015 1040   AST 30 09/25/2013 1425   ALT 28 09/23/2015 1040   ALT 55 09/25/2013 1425   BILITOT 0.4 09/23/2015 1040   BILITOT 0.4 09/25/2013 1425        ASSESSMENT & PLAN:   Cancer of overlapping sites of left female breast ([place]) # stage I ER/PR positive HER-2/neu positive breast cancer currently on adjuvant tamoxifen. Clinically no concerns for recurrence noted. Patient tolerating tamoxifen well except for mild hot flashes [see discussion below].  Multiple SEs- HOLD TAM; check hormones in 6 weeks;   # Dysnpea- ? Etiology; rec fu with PCP/pul.   # hot flashes- grade 1-2. On Effexor  # extreme fatigue and body aches- question vitamin D deficiency; recommend chondoitin/ glucosamine   # follow up in 7 weeks.        Cammie Sickle, MD 09/23/2015 1:34 PM

## 2015-09-24 LAB — CANCER ANTIGEN 27.29: CA 27.29: 11.7 U/mL (ref 0.0–38.6)

## 2015-11-04 ENCOUNTER — Other Ambulatory Visit: Payer: Self-pay

## 2015-11-06 ENCOUNTER — Inpatient Hospital Stay: Payer: Federal, State, Local not specified - PPO | Attending: Internal Medicine

## 2015-11-06 DIAGNOSIS — C50812 Malignant neoplasm of overlapping sites of left female breast: Secondary | ICD-10-CM | POA: Diagnosis present

## 2015-11-06 DIAGNOSIS — Z780 Asymptomatic menopausal state: Secondary | ICD-10-CM

## 2015-11-06 DIAGNOSIS — Z79811 Long term (current) use of aromatase inhibitors: Secondary | ICD-10-CM | POA: Insufficient documentation

## 2015-11-06 DIAGNOSIS — Z170 Estrogen receptor positive status [ER+]: Secondary | ICD-10-CM | POA: Diagnosis not present

## 2015-11-07 LAB — FSH/LH
FSH: 84.5 m[IU]/mL
LH: 46.1 m[IU]/mL

## 2015-11-07 LAB — ESTRADIOL: Estradiol: 17.6 pg/mL

## 2015-11-11 ENCOUNTER — Inpatient Hospital Stay: Payer: Federal, State, Local not specified - PPO | Attending: Internal Medicine | Admitting: Internal Medicine

## 2015-11-11 VITALS — BP 120/84 | Temp 96.9°F | Resp 18 | Wt 190.5 lb

## 2015-11-11 DIAGNOSIS — Z170 Estrogen receptor positive status [ER+]: Secondary | ICD-10-CM | POA: Diagnosis not present

## 2015-11-11 DIAGNOSIS — Z9221 Personal history of antineoplastic chemotherapy: Secondary | ICD-10-CM | POA: Insufficient documentation

## 2015-11-11 DIAGNOSIS — R52 Pain, unspecified: Secondary | ICD-10-CM

## 2015-11-11 DIAGNOSIS — Z79899 Other long term (current) drug therapy: Secondary | ICD-10-CM | POA: Diagnosis not present

## 2015-11-11 DIAGNOSIS — C50412 Malignant neoplasm of upper-outer quadrant of left female breast: Secondary | ICD-10-CM | POA: Insufficient documentation

## 2015-11-11 DIAGNOSIS — K589 Irritable bowel syndrome without diarrhea: Secondary | ICD-10-CM | POA: Insufficient documentation

## 2015-11-11 DIAGNOSIS — I272 Pulmonary hypertension, unspecified: Secondary | ICD-10-CM | POA: Insufficient documentation

## 2015-11-11 DIAGNOSIS — R232 Flushing: Secondary | ICD-10-CM

## 2015-11-11 DIAGNOSIS — Z87891 Personal history of nicotine dependence: Secondary | ICD-10-CM

## 2015-11-11 DIAGNOSIS — K219 Gastro-esophageal reflux disease without esophagitis: Secondary | ICD-10-CM | POA: Diagnosis not present

## 2015-11-11 DIAGNOSIS — R5383 Other fatigue: Secondary | ICD-10-CM

## 2015-11-11 DIAGNOSIS — Z803 Family history of malignant neoplasm of breast: Secondary | ICD-10-CM | POA: Diagnosis not present

## 2015-11-11 DIAGNOSIS — G4730 Sleep apnea, unspecified: Secondary | ICD-10-CM

## 2015-11-11 DIAGNOSIS — Z23 Encounter for immunization: Secondary | ICD-10-CM

## 2015-11-11 DIAGNOSIS — F419 Anxiety disorder, unspecified: Secondary | ICD-10-CM | POA: Insufficient documentation

## 2015-11-11 DIAGNOSIS — Z800 Family history of malignant neoplasm of digestive organs: Secondary | ICD-10-CM | POA: Diagnosis not present

## 2015-11-11 DIAGNOSIS — C50812 Malignant neoplasm of overlapping sites of left female breast: Secondary | ICD-10-CM

## 2015-11-11 MED ORDER — INFLUENZA VAC SPLIT QUAD 0.5 ML IM SUSY
0.5000 mL | PREFILLED_SYRINGE | Freq: Once | INTRAMUSCULAR | Status: DC
Start: 2015-11-11 — End: 2015-12-21

## 2015-11-11 MED ORDER — ANASTROZOLE 1 MG PO TABS: 1.0000 mg | ORAL_TABLET | Freq: Every day | ORAL | 6 refills | Status: DC

## 2015-11-11 NOTE — Assessment & Plan Note (Addendum)
#  stage I ER/PR positive HER-2/neu positive breast cancer currently on adjuvant tamoxifen. Clinically no concerns for recurrence noted.  # Poor tolerance to TAM sec to hotflashes; Estradiol/ FSH-; last peroid- 20 years ago- Post menopausal state. Recommend arimidex.  # hot flashes- grade 1-2. On Effexor  # extreme fatigue and body aches- question vitamin D deficiency; recommend chondoitin/ glucosamine.   # follow up in 3 months.

## 2015-11-11 NOTE — Progress Notes (Signed)
Patient is here for follow up, she mention for 4 weeks after stopping the Tomoxifen medication she felt like she had the flu, she was very tired, and nausea and just not feeling well at all. She is doing better now. She had bad headaches and hot flashes.

## 2015-11-11 NOTE — Progress Notes (Signed)
[place] OFFICE PROGRESS NOTE  Patient Care Team: Glendon Axe, MD as PCP - General (Internal Medicine) Robert Bellow, MD (General Surgery) [name] Dear, MD (Inactive) (Family Medicine)   SUMMARY OF ONCOLOGIC HISTORY:  Oncology History   # JAN 2014- LEFT BREAST CA IDC; pT1c (1.2cm) pN0 [Stage I] ER/PR > 90%; her 2 Neu POS; AC x4- Taxol-Herceptin; Intol to AI; March 2015- Started TAM [stopped- Sep 2017]; OCT 2017- Post menopausal [estradiol/FSH]; NOV 1st 2017- Start ARIMIDEX  # DEC 2016- Start Effexor   # Right oopherectomy; Left intact ovary-intact [last periods late 90s]     Carcinoma of overlapping sites of left breast in female, estrogen receptor positive ([place])   09/23/2015 Initial Diagnosis    Cancer of overlapping sites of left female breast [facility])       INTERVAL HISTORY:  A very pleasant 55 year old female patient with above history of stage I breast cancer triple positive currently on adjuvant tamoxifen is here for follow-up. Tamoxifen is a hold- for the last 6 weeks second hot flashes. She is here today with the hormonal tests.  Hot flashes are better. However continues to have intermittent body aches. Patient is taking calcium and vitamin D. No headaches or vision changes.  REVIEW OF SYSTEMS:  A complete 10 point review of system is done which is negative except mentioned above/history of present illness.   PAST MEDICAL HISTORY :  Past Medical History:  Diagnosis Date  . Anxiety   . Breast CA ([place])   . Endometriosis 2001  . GERD (gastroesophageal reflux disease)   . Irritable bowel syndrome   . Malignant neoplasm of upper-outer quadrant of female breast ([place]) 01/30/2012   Left breast, Invasive mammary cancer, no special type: T1c,N0,M0; ER 90%, PR 90%, no amplification of HER-2/neu, aromatase inhibitors discontinued September 2015 secondary to joint symptoms, tamoxifen initiated by Dr. Ma Hillock..  . Pulmonary hypertension 2013  . Sleep apnea  2007  . Sleep apnea 2007    PAST SURGICAL HISTORY :   Past Surgical History:  Procedure Laterality Date  . BREAST BIOPSY Left 2013   neg  . BREAST BIOPSY Left 01/14   +  . BREAST BIOPSY Left 12/14   FNA   . BREAST ENHANCEMENT SURGERY  2014,05/2013  . BREAST SURGERY Left 01/30/2012   Wide excision, sentinel node biopsy  . BREAST SURGERY Left 02/09/2012   Left breast reduction/reconstruction: Nicholaus Bloom, MD.  . BREAST SURGERY Left December 26, 2012   ultrasound-guided biopsy, post surgical change.  . COLONOSCOPY  2012  . DILATION AND CURETTAGE OF UTERUS  2011  . EXPLORATORY LAPAROTOMY  2001  . FACIAL COSMETIC SURGERY  2015  . HERNIA REPAIR  1997  . NISSEN FUNDOPLICATION  0981  . OVARY SURGERY Right 2001  . PORT A CATH REVISION  2014  . REDUCTION MAMMAPLASTY Left 2014  . REDUCTION MAMMAPLASTY Right 2015  . RIGHT HEART CATH  2013  . TONSILLECTOMY  1969    FAMILY HISTORY :   Family History  Problem Relation Age of Onset  . Colon cancer Paternal Grandfather   . Breast cancer Maternal Aunt 29  . Breast cancer Cousin 4  . Breast cancer Cousin 36  . Breast cancer Cousin 93    SOCIAL HISTORY:   Social History  Substance Use Topics  . Smoking status: Former Smoker    Packs/day: 0.10    Years: 25.00    Types: Cigarettes    Quit date: 01/11/2007  . Smokeless  tobacco: Never Used     Comment: "social smoker" per pt.  . Alcohol use No    ALLERGIES:  is allergic to augmentin [amoxicillin-pot clavulanate].  MEDICATIONS:  Current Outpatient Prescriptions  Medication Sig Dispense Refill  . acyclovir (ZOVIRAX) 400 MG tablet     . albuterol (PROAIR HFA) 108 (90 Base) MCG/ACT inhaler Inhale into the lungs.    . diclofenac (VOLTAREN) 75 MG EC tablet Take 75 mg by mouth 2 (two) times daily.     Marland Kitchen docusate sodium (COLACE) 100 MG capsule Take by mouth.    . montelukast (SINGULAIR) 10 MG tablet TAKE ONE TABLET BY MOUTH AT BEDTIME 30 tablet 0  . phentermine (ADIPEX-P) 37.5 MG  tablet     . venlafaxine (EFFEXOR) 75 MG tablet TAKE ONE TABLET BY MOUTH TWICE DAILY WITH MEALS 60 tablet 3  . anastrozole (ARIMIDEX) 1 MG tablet Take 1 tablet (1 mg total) by mouth daily. 30 tablet 6   Current Facility-Administered Medications  Medication Dose Route Frequency Provider Last Rate Last Dose  . Influenza vac split quadrivalent PF (FLUARIX) injection 0.5 mL  0.5 mL Intramuscular Once Cammie Sickle, MD        PHYSICAL EXAMINATION: ECOG PERFORMANCE STATUS: 0 - Asymptomatic  BP 120/84 (BP Location: Right Arm, Patient Position: Sitting) Comment: manual  Temp (!) 96.9 F (36.1 C) (Tympanic)   Resp 18   Wt 190 lb 7.6 oz (86.4 kg)   BMI 34.84 kg/m   Filed Weights   11/11/15 1157  Weight: 190 lb 7.6 oz (86.4 kg)    GENERAL: Well-nourished well-developed; Alert, no distress and comfortable.   Alone.  EYES: no pallor or icterus OROPHARYNX: no thrush or ulceration; good dentition  NECK: supple, no masses felt LYMPH:  no palpable lymphadenopathy in the cervical, axillary or inguinal regions LUNGS: clear to auscultation and  No wheeze or crackles HEART/CVS: regular rate & rhythm and no murmurs; No lower extremity edema ABDOMEN:abdomen soft, non-tender and normal bowel sounds Musculoskeletal:no cyanosis of digits and no clubbing  PSYCH: alert & oriented x 3 with fluent speech NEURO: no focal motor/sensory deficits SKIN:  no rashes or significant lesions  LABORATORY DATA:  I have reviewed the data as listed    Component Value Date/Time   NA 137 09/23/2015 1040   NA 140 09/25/2013 1425   K 3.9 09/23/2015 1040   K 4.0 09/25/2013 1425   CL 107 09/23/2015 1040   CL 103 09/25/2013 1425   CO2 23 09/23/2015 1040   CO2 29 09/25/2013 1425   GLUCOSE 142 (H) 09/23/2015 1040   GLUCOSE 92 09/25/2013 1425   BUN 16 09/23/2015 1040   BUN 17 09/25/2013 1425   CREATININE 0.74 09/23/2015 1040   CREATININE 0.87 09/25/2013 1425   CALCIUM 9.1 09/23/2015 1040   CALCIUM 10.1  09/25/2013 1425   PROT 6.8 09/23/2015 1040   PROT 7.2 09/25/2013 1425   ALBUMIN 4.1 09/23/2015 1040   ALBUMIN 3.9 09/25/2013 1425   AST 28 09/23/2015 1040   AST 30 09/25/2013 1425   ALT 28 09/23/2015 1040   ALT 55 09/25/2013 1425   ALKPHOS 72 09/23/2015 1040   ALKPHOS 154 (H) 09/25/2013 1425   BILITOT 0.4 09/23/2015 1040   BILITOT 0.4 09/25/2013 1425   GFRNONAA >60 09/23/2015 1040   GFRNONAA >60 09/25/2013 1425   GFRAA >60 09/23/2015 1040   GFRAA >60 09/25/2013 1425    No results found for: SPEP, UPEP  Lab Results  Component Value Date  WBC 4.7 09/23/2015   NEUTROABS 2.3 09/23/2015   HGB 13.0 09/23/2015   HCT 38.1 09/23/2015   MCV 94.2 09/23/2015   PLT 256 09/23/2015      Chemistry      Component Value Date/Time   NA 137 09/23/2015 1040   NA 140 09/25/2013 1425   K 3.9 09/23/2015 1040   K 4.0 09/25/2013 1425   CL 107 09/23/2015 1040   CL 103 09/25/2013 1425   CO2 23 09/23/2015 1040   CO2 29 09/25/2013 1425   BUN 16 09/23/2015 1040   BUN 17 09/25/2013 1425   CREATININE 0.74 09/23/2015 1040   CREATININE 0.87 09/25/2013 1425      Component Value Date/Time   CALCIUM 9.1 09/23/2015 1040   CALCIUM 10.1 09/25/2013 1425   ALKPHOS 72 09/23/2015 1040   ALKPHOS 154 (H) 09/25/2013 1425   AST 28 09/23/2015 1040   AST 30 09/25/2013 1425   ALT 28 09/23/2015 1040   ALT 55 09/25/2013 1425   BILITOT 0.4 09/23/2015 1040   BILITOT 0.4 09/25/2013 1425        ASSESSMENT & PLAN:   Carcinoma of overlapping sites of left breast in female, estrogen receptor positive ([place]) # stage I ER/PR positive HER-2/neu positive breast cancer currently on adjuvant tamoxifen. Clinically no concerns for recurrence noted.  # Poor tolerance to TAM sec to hotflashes; Estradiol/ FSH-; last peroid- 20 years ago- Post menopausal state. Recommend arimidex.  # hot flashes- grade 1-2. On Effexor  # extreme fatigue and body aches- question vitamin D deficiency; recommend chondoitin/ glucosamine.    # follow up in 3 months.        Cammie Sickle, MD 11/12/2015 8:03 AM

## 2016-01-05 ENCOUNTER — Other Ambulatory Visit: Payer: Self-pay | Admitting: Internal Medicine

## 2016-02-10 ENCOUNTER — Inpatient Hospital Stay: Payer: Federal, State, Local not specified - PPO | Attending: Internal Medicine | Admitting: Internal Medicine

## 2016-02-10 VITALS — BP 139/79 | HR 77 | Temp 97.4°F | Wt 180.5 lb

## 2016-02-10 DIAGNOSIS — R5383 Other fatigue: Secondary | ICD-10-CM | POA: Insufficient documentation

## 2016-02-10 DIAGNOSIS — C50812 Malignant neoplasm of overlapping sites of left female breast: Secondary | ICD-10-CM | POA: Diagnosis not present

## 2016-02-10 DIAGNOSIS — N809 Endometriosis, unspecified: Secondary | ICD-10-CM | POA: Diagnosis not present

## 2016-02-10 DIAGNOSIS — Z79811 Long term (current) use of aromatase inhibitors: Secondary | ICD-10-CM

## 2016-02-10 DIAGNOSIS — Z170 Estrogen receptor positive status [ER+]: Secondary | ICD-10-CM | POA: Diagnosis not present

## 2016-02-10 DIAGNOSIS — R531 Weakness: Secondary | ICD-10-CM | POA: Insufficient documentation

## 2016-02-10 DIAGNOSIS — Z87891 Personal history of nicotine dependence: Secondary | ICD-10-CM | POA: Diagnosis not present

## 2016-02-10 DIAGNOSIS — K219 Gastro-esophageal reflux disease without esophagitis: Secondary | ICD-10-CM | POA: Insufficient documentation

## 2016-02-10 DIAGNOSIS — N951 Menopausal and female climacteric states: Secondary | ICD-10-CM | POA: Insufficient documentation

## 2016-02-10 DIAGNOSIS — Z79899 Other long term (current) drug therapy: Secondary | ICD-10-CM

## 2016-02-10 DIAGNOSIS — Z803 Family history of malignant neoplasm of breast: Secondary | ICD-10-CM

## 2016-02-10 NOTE — Progress Notes (Signed)
Patient here today for follow up.  Patient c/o dry eyes, blurry vision and left breast pain

## 2016-02-10 NOTE — Progress Notes (Signed)
[place] OFFICE PROGRESS NOTE  Patient Care Team: Glendon Axe, MD as PCP - General (Internal Medicine) Robert Bellow, MD (General Surgery) [name] Dear, MD (Inactive) (Family Medicine)   SUMMARY OF ONCOLOGIC HISTORY:  Oncology History   # JAN 2014- LEFT BREAST CA IDC; pT1c (1.2cm) pN0 [Stage I] ER/PR > 90%; her 2 Neu POS; AC x4- Taxol-Herceptin; Intol to AI; March 2015- Started TAM [stopped- Sep 2017]; OCT 2017- Post menopausal [estradiol/FSH]; NOV 1st 2017- Start ARIMIDEX  # DEC 2016- Start Effexor   # Right oopherectomy; Left intact ovary-intact [last periods late 90s]     Carcinoma of overlapping sites of left breast in female, estrogen receptor positive ([place])   09/23/2015 Initial Diagnosis    Cancer of overlapping sites of left female breast [facility])       INTERVAL HISTORY:  A very pleasant 56 year old female patient with above history of stage I breast cancer triple positive currently on adjuvant arimidex Is here for follow-up.  Hot flashes are better. However continues to have intermittent body aches-however improved.. Patient is taking calcium and vitamin D. No headaches or vision changes. She does complain of pain in the left breast. No lumps.  REVIEW OF SYSTEMS:  A complete 10 point review of system is done which is negative except mentioned above/history of present illness.   PAST MEDICAL HISTORY :  Past Medical History:  Diagnosis Date  . Anxiety   . Breast CA ([place])   . Endometriosis 2001  . GERD (gastroesophageal reflux disease)   . Irritable bowel syndrome   . Malignant neoplasm of upper-outer quadrant of female breast ([place]) 01/30/2012   Left breast, Invasive mammary cancer, no special type: T1c,N0,M0; ER 90%, PR 90%, no amplification of HER-2/neu, aromatase inhibitors discontinued September 2015 secondary to joint symptoms, tamoxifen initiated by Dr. Ma Hillock..  . Pulmonary hypertension 2013  . Sleep apnea 2007  . Sleep apnea 2007     PAST SURGICAL HISTORY :   Past Surgical History:  Procedure Laterality Date  . BREAST BIOPSY Left 2013   neg  . BREAST BIOPSY Left 01/14   +  . BREAST BIOPSY Left 12/14   FNA   . BREAST ENHANCEMENT SURGERY  2014,05/2013  . BREAST SURGERY Left 01/30/2012   Wide excision, sentinel node biopsy  . BREAST SURGERY Left 02/09/2012   Left breast reduction/reconstruction: Nicholaus Bloom, MD.  . BREAST SURGERY Left December 26, 2012   ultrasound-guided biopsy, post surgical change.  . COLONOSCOPY  2012  . DILATION AND CURETTAGE OF UTERUS  2011  . EXPLORATORY LAPAROTOMY  2001  . FACIAL COSMETIC SURGERY  2015  . HERNIA REPAIR  1997  . NISSEN FUNDOPLICATION  9528  . OVARY SURGERY Right 2001  . PORT A CATH REVISION  2014  . REDUCTION MAMMAPLASTY Left 2014  . REDUCTION MAMMAPLASTY Right 2015  . RIGHT HEART CATH  2013  . TONSILLECTOMY  1969    FAMILY HISTORY :   Family History  Problem Relation Age of Onset  . Colon cancer Paternal Grandfather   . Breast cancer Maternal Aunt 52  . Breast cancer Cousin 56  . Breast cancer Cousin 39  . Breast cancer Cousin 52    SOCIAL HISTORY:   Social History  Substance Use Topics  . Smoking status: Former Smoker    Packs/day: 0.10    Years: 25.00    Types: Cigarettes    Quit date: 01/11/2007  . Smokeless tobacco: Never Used     Comment: "  social smoker" per pt.  . Alcohol use No    ALLERGIES:  is allergic to augmentin [amoxicillin-pot clavulanate].  MEDICATIONS:  Current Outpatient Prescriptions  Medication Sig Dispense Refill  . acyclovir (ZOVIRAX) 400 MG tablet Take 400 mg by mouth 2 (two) times daily.     Marland Kitchen anastrozole (ARIMIDEX) 1 MG tablet Take 1 tablet (1 mg total) by mouth daily. 30 tablet 6  . diclofenac (VOLTAREN) 75 MG EC tablet Take 75 mg by mouth 2 (two) times daily.     Marland Kitchen docusate sodium (COLACE) 100 MG capsule Take 100 mg by mouth daily as needed.     . montelukast (SINGULAIR) 10 MG tablet TAKE ONE TABLET BY MOUTH AT BEDTIME  30 tablet 0  . phentermine (ADIPEX-P) 37.5 MG tablet     . venlafaxine (EFFEXOR) 75 MG tablet TAKE ONE TABLET BY MOUTH TWICE DAILY WITH MEALS 60 tablet 3   No current facility-administered medications for this visit.     PHYSICAL EXAMINATION: ECOG PERFORMANCE STATUS: 0 - Asymptomatic  BP 139/79 (BP Location: Right Arm, Patient Position: Sitting)   Pulse 77   Temp 97.4 F (36.3 C)   Wt 180 lb 8 oz (81.9 kg)   BMI 33.01 kg/m   Filed Weights   02/10/16 1049  Weight: 180 lb 8 oz (81.9 kg)    GENERAL: Well-nourished well-developed; Alert, no distress and comfortable.   Alone.  EYES: no pallor or icterus OROPHARYNX: no thrush or ulceration; good dentition  NECK: supple, no masses felt LYMPH:  no palpable lymphadenopathy in the cervical, axillary or inguinal regions LUNGS: clear to auscultation and  No wheeze or crackles HEART/CVS: regular rate & rhythm and no murmurs; No lower extremity edema ABDOMEN:abdomen soft, non-tender and normal bowel sounds Musculoskeletal:no cyanosis of digits and no clubbing  PSYCH: alert & oriented x 3 with fluent speech NEURO: no focal motor/sensory deficits SKIN:  no rashes or significant lesions Right and left BREAST exam [in the presence of nurse]- no unusual skin changes or dominant masses felt. Surgical scars noted.    LABORATORY DATA:  I have reviewed the data as listed    Component Value Date/Time   NA 137 09/23/2015 1040   NA 140 09/25/2013 1425   K 3.9 09/23/2015 1040   K 4.0 09/25/2013 1425   CL 107 09/23/2015 1040   CL 103 09/25/2013 1425   CO2 23 09/23/2015 1040   CO2 29 09/25/2013 1425   GLUCOSE 142 (H) 09/23/2015 1040   GLUCOSE 92 09/25/2013 1425   BUN 16 09/23/2015 1040   BUN 17 09/25/2013 1425   CREATININE 0.74 09/23/2015 1040   CREATININE 0.87 09/25/2013 1425   CALCIUM 9.1 09/23/2015 1040   CALCIUM 10.1 09/25/2013 1425   PROT 6.8 09/23/2015 1040   PROT 7.2 09/25/2013 1425   ALBUMIN 4.1 09/23/2015 1040   ALBUMIN 3.9  09/25/2013 1425   AST 28 09/23/2015 1040   AST 30 09/25/2013 1425   ALT 28 09/23/2015 1040   ALT 55 09/25/2013 1425   ALKPHOS 72 09/23/2015 1040   ALKPHOS 154 (H) 09/25/2013 1425   BILITOT 0.4 09/23/2015 1040   BILITOT 0.4 09/25/2013 1425   GFRNONAA >60 09/23/2015 1040   GFRNONAA >60 09/25/2013 1425   GFRAA >60 09/23/2015 1040   GFRAA >60 09/25/2013 1425    No results found for: SPEP, UPEP  Lab Results  Component Value Date   WBC 4.7 09/23/2015   NEUTROABS 2.3 09/23/2015   HGB 13.0 09/23/2015   HCT 38.1  09/23/2015   MCV 94.2 09/23/2015   PLT 256 09/23/2015      Chemistry      Component Value Date/Time   NA 137 09/23/2015 1040   NA 140 09/25/2013 1425   K 3.9 09/23/2015 1040   K 4.0 09/25/2013 1425   CL 107 09/23/2015 1040   CL 103 09/25/2013 1425   CO2 23 09/23/2015 1040   CO2 29 09/25/2013 1425   BUN 16 09/23/2015 1040   BUN 17 09/25/2013 1425   CREATININE 0.74 09/23/2015 1040   CREATININE 0.87 09/25/2013 1425      Component Value Date/Time   CALCIUM 9.1 09/23/2015 1040   CALCIUM 10.1 09/25/2013 1425   ALKPHOS 72 09/23/2015 1040   ALKPHOS 154 (H) 09/25/2013 1425   AST 28 09/23/2015 1040   AST 30 09/25/2013 1425   ALT 28 09/23/2015 1040   ALT 55 09/25/2013 1425   BILITOT 0.4 09/23/2015 1040   BILITOT 0.4 09/25/2013 1425        ASSESSMENT & PLAN:   Carcinoma of overlapping sites of left breast in female, estrogen receptor positive ([place]) # stage I ER/PR positive HER-2/neu positive breast cancer currently on adjuvant tamoxifen. Clinically no concerns for recurrence noted. On Arimidex.  # hot flashes- grade 1-2. On Effexor  # left breast pain- no masses felt. Chronic neuropathic pain post lumpectomy.  # left chest wall pain- ? costo-chondritis- recommend NSIADs.   # Fatigue and body aches- improved.  vit D; on chondoitin/ glucosamine.   # follow up in 6 months. Due mammogram in July 2018 [[email or web address]].        Cammie Sickle, MD 02/10/2016  5:39 PM

## 2016-02-10 NOTE — Assessment & Plan Note (Addendum)
#  stage I ER/PR positive HER-2/neu positive breast cancer currently on adjuvant tamoxifen. Clinically no concerns for recurrence noted. On Arimidex.  # hot flashes- grade 1-2. On Effexor  # left breast pain- no masses felt. Chronic neuropathic pain post lumpectomy.  # left chest wall pain- ? costo-chondritis- recommend NSIADs.   # Fatigue and body aches- improved.  vit D; on chondoitin/ glucosamine.   # follow up in 6 months. Due mammogram in July 2018 [Dr.Byrnett].

## 2016-02-11 ENCOUNTER — Ambulatory Visit: Payer: Self-pay | Admitting: Internal Medicine

## 2016-03-18 ENCOUNTER — Telehealth: Payer: Self-pay | Admitting: Internal Medicine

## 2016-03-18 ENCOUNTER — Telehealth: Payer: Self-pay | Admitting: *Deleted

## 2016-03-18 MED ORDER — LETROZOLE 2.5 MG PO TABS: 2.5000 mg | ORAL_TABLET | Freq: Every day | ORAL | 3 refills | Status: DC

## 2016-03-18 NOTE — Telephone Encounter (Signed)
Patient agrees to change appt to 6/6 @ 10:00 AM. Advised to stop the Anastrozole and that new rx for letrozole has been sent to [facility] as requested. She repeated back to me and asked if she should wait to start Letrozole, I advised she can go ahead and start it tomorrow

## 2016-03-18 NOTE — Telephone Encounter (Signed)
Asking to have her Anastrozole changed to something else because it is affecting her vision to the point she can hardly see. Please advise

## 2016-03-18 NOTE — Telephone Encounter (Signed)
Blurriness of eyes ? Sec to arimidex; stop; start femara; check with opthal; follow up in 3 months. Spoke to [place].

## 2016-03-18 NOTE — Telephone Encounter (Signed)
Per Dr Lois Huxley, Letrozole 2.5 mg daily #30 + 3 refills, FU in 3 months, Recommend she sees an opthamologist

## 2016-05-09 ENCOUNTER — Other Ambulatory Visit: Payer: Self-pay

## 2016-05-09 DIAGNOSIS — Z170 Estrogen receptor positive status [ER+]: Principal | ICD-10-CM

## 2016-05-09 DIAGNOSIS — C50812 Malignant neoplasm of overlapping sites of left female breast: Secondary | ICD-10-CM

## 2016-06-15 ENCOUNTER — Ambulatory Visit: Payer: Self-pay | Admitting: Internal Medicine

## 2016-06-15 ENCOUNTER — Other Ambulatory Visit: Payer: Self-pay

## 2016-07-04 ENCOUNTER — Other Ambulatory Visit: Payer: Self-pay | Admitting: Family Medicine

## 2016-07-04 DIAGNOSIS — R42 Dizziness and giddiness: Secondary | ICD-10-CM

## 2016-07-05 ENCOUNTER — Other Ambulatory Visit: Payer: Self-pay | Admitting: Family Medicine

## 2016-07-05 DIAGNOSIS — R42 Dizziness and giddiness: Secondary | ICD-10-CM

## 2016-07-06 ENCOUNTER — Other Ambulatory Visit: Payer: Self-pay | Admitting: Family Medicine

## 2016-07-06 DIAGNOSIS — R42 Dizziness and giddiness: Secondary | ICD-10-CM

## 2016-07-08 ENCOUNTER — Ambulatory Visit
Admission: RE | Admit: 2016-07-08 | Discharge: 2016-07-08 | Disposition: A | Discharge status: Home / Self Care | Payer: Federal, State, Local not specified - PPO | Source: Ambulatory Visit | Attending: Family Medicine | Admitting: Family Medicine

## 2016-07-08 DIAGNOSIS — R42 Dizziness and giddiness: Secondary | ICD-10-CM | POA: Diagnosis present

## 2016-07-08 HISTORY — DX: Unspecified asthma, uncomplicated: J45.909

## 2016-07-08 MED ORDER — IOPAMIDOL (ISOVUE-300) INJECTION 61%
75.0000 mL | Freq: Once | INTRAVENOUS | Status: AC | PRN
  Administered 2016-07-08: 75 mL via INTRAVENOUS

## 2016-07-20 ENCOUNTER — Other Ambulatory Visit: Payer: Self-pay

## 2016-07-20 ENCOUNTER — Ambulatory Visit: Payer: Self-pay | Admitting: Internal Medicine

## 2016-07-29 ENCOUNTER — Other Ambulatory Visit: Payer: Self-pay

## 2016-08-03 ENCOUNTER — Other Ambulatory Visit: Payer: Self-pay

## 2016-08-03 ENCOUNTER — Telehealth: Payer: Self-pay | Admitting: Internal Medicine

## 2016-08-03 ENCOUNTER — Inpatient Hospital Stay (HOSPITAL_BASED_OUTPATIENT_CLINIC_OR_DEPARTMENT_OTHER): Payer: Federal, State, Local not specified - PPO | Admitting: Internal Medicine

## 2016-08-03 ENCOUNTER — Inpatient Hospital Stay: Payer: Federal, State, Local not specified - PPO | Attending: Internal Medicine

## 2016-08-03 VITALS — BP 115/67 | HR 71 | Temp 97.0°F | Resp 16 | Wt 185.4 lb

## 2016-08-03 DIAGNOSIS — G4730 Sleep apnea, unspecified: Secondary | ICD-10-CM

## 2016-08-03 DIAGNOSIS — N644 Mastodynia: Secondary | ICD-10-CM

## 2016-08-03 DIAGNOSIS — C50812 Malignant neoplasm of overlapping sites of left female breast: Secondary | ICD-10-CM

## 2016-08-03 DIAGNOSIS — F419 Anxiety disorder, unspecified: Secondary | ICD-10-CM

## 2016-08-03 DIAGNOSIS — R232 Flushing: Secondary | ICD-10-CM | POA: Insufficient documentation

## 2016-08-03 DIAGNOSIS — M8580 Other specified disorders of bone density and structure, unspecified site: Secondary | ICD-10-CM | POA: Insufficient documentation

## 2016-08-03 DIAGNOSIS — Z800 Family history of malignant neoplasm of digestive organs: Secondary | ICD-10-CM | POA: Diagnosis not present

## 2016-08-03 DIAGNOSIS — K219 Gastro-esophageal reflux disease without esophagitis: Secondary | ICD-10-CM

## 2016-08-03 DIAGNOSIS — G8929 Other chronic pain: Secondary | ICD-10-CM

## 2016-08-03 DIAGNOSIS — K589 Irritable bowel syndrome without diarrhea: Secondary | ICD-10-CM | POA: Insufficient documentation

## 2016-08-03 DIAGNOSIS — Z79811 Long term (current) use of aromatase inhibitors: Secondary | ICD-10-CM | POA: Diagnosis not present

## 2016-08-03 DIAGNOSIS — J45909 Unspecified asthma, uncomplicated: Secondary | ICD-10-CM | POA: Diagnosis not present

## 2016-08-03 DIAGNOSIS — Z87891 Personal history of nicotine dependence: Secondary | ICD-10-CM | POA: Insufficient documentation

## 2016-08-03 DIAGNOSIS — Z79899 Other long term (current) drug therapy: Secondary | ICD-10-CM | POA: Insufficient documentation

## 2016-08-03 DIAGNOSIS — I272 Pulmonary hypertension, unspecified: Secondary | ICD-10-CM | POA: Insufficient documentation

## 2016-08-03 DIAGNOSIS — Z803 Family history of malignant neoplasm of breast: Secondary | ICD-10-CM | POA: Insufficient documentation

## 2016-08-03 DIAGNOSIS — Z170 Estrogen receptor positive status [ER+]: Secondary | ICD-10-CM

## 2016-08-03 LAB — COMPREHENSIVE METABOLIC PANEL
ALT: 24 U/L (ref 14–54)
AST: 20 U/L (ref 15–41)
Albumin: 4.1 g/dL (ref 3.5–5.0)
Alkaline Phosphatase: 103 U/L (ref 38–126)
Anion gap: 5 (ref 5–15)
BUN: 17 mg/dL (ref 6–20)
CO2: 28 mmol/L (ref 22–32)
Calcium: 9.5 mg/dL (ref 8.9–10.3)
Chloride: 101 mmol/L (ref 101–111)
Creatinine, Ser: 0.79 mg/dL (ref 0.44–1.00)
GFR calc Af Amer: >60 mL/min (ref >60)
GFR calc non Af Amer: >60 mL/min (ref >60)
Glucose, Bld: 97 mg/dL (ref 65–99)
Potassium: 4 mmol/L (ref 3.5–5.1)
Sodium: 134 mmol/L — ABNORMAL LOW (ref 135–145)
Total Bilirubin: 0.5 mg/dL (ref 0.3–1.2)
Total Protein: 6.9 g/dL (ref 6.5–8.1)

## 2016-08-03 LAB — CBC WITH DIFFERENTIAL/PLATELET
Basophils Absolute: 0 10*3/uL (ref 0–0.1)
Basophils Relative: 1 %
Eosinophils Absolute: 0 10*3/uL (ref 0–0.7)
Eosinophils Relative: 1 %
HCT: 37.9 % (ref 35.0–47.0)
Hemoglobin: 13.2 g/dL (ref 12.0–16.0)
Lymphocytes Relative: 38 %
Lymphs Abs: 2.1 10*3/uL (ref 1.0–3.6)
MCH: 32 pg (ref 26.0–34.0)
MCHC: 34.8 g/dL (ref 32.0–36.0)
MCV: 91.9 fL (ref 80.0–100.0)
Monocytes Absolute: 0.3 10*3/uL (ref 0.2–0.9)
Monocytes Relative: 6 %
Neutro Abs: 3.1 10*3/uL (ref 1.4–6.5)
Neutrophils Relative %: 54 %
Platelets: 291 10*3/uL (ref 150–440)
RBC: 4.12 MIL/uL (ref 3.80–5.20)
RDW: 12.4 % (ref 11.5–14.5)
WBC: 5.6 10*3/uL (ref 3.6–11.0)

## 2016-08-03 NOTE — Assessment & Plan Note (Addendum)
#  stage I ER/PR positive HER-2/neu positive breast cancer currently on adjuvant femara.  Clinically no evidence of recurrence. Awaiting mammogram end of the month/appointment with surgery.  # hot flashes- grade 1; improved continue On Effexor  # left breast pain- no masses felt. Chronic neuropathic pain post lumpectomy. Await mammogram  # Left chest wall swelling/tightness- question lymphedema. Recommend referral to physical therapy.   # Osteopenia- 2017 June BMD; continue ca+vitD.   # follow up in 6 months. Labs/ ca-27-29. Labs- normal today.

## 2016-08-03 NOTE — Progress Notes (Signed)
[place] OFFICE PROGRESS NOTE  Patient Care Team: Glendon Axe, MD as PCP - General (Internal Medicine) Bary Castilla, Forest Gleason, MD (General Surgery) Dear, [name], MD (Inactive) (Family Medicine)   SUMMARY OF ONCOLOGIC HISTORY:  Oncology History   # JAN 2014- LEFT BREAST CA IDC; pT1c (1.2cm) pN0 [Stage I] ER/PR > 90%; her 2 Neu POS; AC x4- Taxol-Herceptin; Intol to AI; March 2015- Started TAM [stopped- Sep 2017]; OCT 2017- Post menopausal [estradiol/FSH]; NOV 1st 2017- Start ARIMIDEX  # DEC 2016- Start Effexor   # Right oopherectomy; Left intact ovary-intact [last periods late 90s]     Carcinoma of overlapping sites of left breast in female, estrogen receptor positive ([place])   09/23/2015 Initial Diagnosis    Cancer of overlapping sites of left female breast [facility])       INTERVAL HISTORY:  A very pleasant 56 year old female patient with above history of stage I breast cancer triple positive currently on adjuvant arimidex Is here for follow-up.  Patient try to wean herself off the Effexor; however noted to have worsening hot flashes. She is currently back on the Effexor.  She notes to have tightness/swelling in the left underarm- especially with movement. . Patient is taking calcium and vitamin D. No headaches or vision changes. She does complain of chronic pain in the left breast. No lumps.  REVIEW OF SYSTEMS:  A complete 10 point review of system is done which is negative except mentioned above/history of present illness.   PAST MEDICAL HISTORY :  Past Medical History:  Diagnosis Date  . Anxiety   . Asthma   . Breast CA ([place])   . Endometriosis 2001  . GERD (gastroesophageal reflux disease)   . Irritable bowel syndrome   . Malignant neoplasm of upper-outer quadrant of female breast ([place]) 01/30/2012   Left breast, Invasive mammary cancer, no special type: T1c,N0,M0; ER 90%, PR 90%, no amplification of HER-2/neu, aromatase inhibitors discontinued September 2015  secondary to joint symptoms, tamoxifen initiated by Dr. Ma Hillock..  . Pulmonary hypertension ([place]) 2013  . Sleep apnea 2007  . Sleep apnea 2007    PAST SURGICAL HISTORY :   Past Surgical History:  Procedure Laterality Date  . BREAST BIOPSY Left 2013   neg  . BREAST BIOPSY Left 01/14   +  . BREAST BIOPSY Left 12/14   FNA   . BREAST ENHANCEMENT SURGERY  2014,05/2013  . BREAST SURGERY Left 01/30/2012   Wide excision, sentinel node biopsy  . BREAST SURGERY Left 02/09/2012   Left breast reduction/reconstruction: Nicholaus Bloom, MD.  . BREAST SURGERY Left December 26, 2012   ultrasound-guided biopsy, post surgical change.  . COLONOSCOPY  2012  . DILATION AND CURETTAGE OF UTERUS  2011  . EXPLORATORY LAPAROTOMY  2001  . FACIAL COSMETIC SURGERY  2015  . HERNIA REPAIR  1997  . NISSEN FUNDOPLICATION  3354  . OVARY SURGERY Right 2001  . PORT A CATH REVISION  2014  . REDUCTION MAMMAPLASTY Left 2014  . REDUCTION MAMMAPLASTY Right 2015  . RIGHT HEART CATH  2013  . TONSILLECTOMY  1969    FAMILY HISTORY :   Family History  Problem Relation Age of Onset  . Colon cancer Paternal Grandfather   . Breast cancer Maternal Aunt 25  . Breast cancer Cousin 73  . Breast cancer Cousin 35  . Breast cancer Cousin 79    SOCIAL HISTORY:   Social History  Substance Use Topics  . Smoking status: Former Smoker  Packs/day: 0.10    Years: 25.00    Types: Cigarettes    Quit date: 01/11/2007  . Smokeless tobacco: Never Used     Comment: "social smoker" per pt.  . Alcohol use No    ALLERGIES:  is allergic to augmentin [amoxicillin-pot clavulanate].  MEDICATIONS:  Current Outpatient Prescriptions  Medication Sig Dispense Refill  . acyclovir (ZOVIRAX) 400 MG tablet Take 400 mg by mouth 2 (two) times daily.     . diclofenac (VOLTAREN) 75 MG EC tablet Take 75 mg by mouth 2 (two) times daily.     Marland Kitchen docusate sodium (COLACE) 100 MG capsule Take 100 mg by mouth daily as needed.     Marland Kitchen letrozole (FEMARA)  2.5 MG tablet Take 1 tablet (2.5 mg total) by mouth daily. 30 tablet 3  . montelukast (SINGULAIR) 10 MG tablet TAKE ONE TABLET BY MOUTH AT BEDTIME 30 tablet 0  . phentermine (ADIPEX-P) 37.5 MG tablet     . venlafaxine (EFFEXOR) 75 MG tablet TAKE ONE TABLET BY MOUTH TWICE DAILY WITH MEALS 60 tablet 3   No current facility-administered medications for this visit.     PHYSICAL EXAMINATION: ECOG PERFORMANCE STATUS: 0 - Asymptomatic  BP 115/67 (BP Location: Right Arm, Patient Position: Sitting)   Pulse 71   Temp (!) 97 F (36.1 C) (Tympanic)   Resp 16   Wt 185 lb 6.4 oz (84.1 kg)   BMI 33.91 kg/m   Filed Weights   08/03/16 1220  Weight: 185 lb 6.4 oz (84.1 kg)    GENERAL: Well-nourished well-developed; Alert, no distress and comfortable.   Alone.  EYES: no pallor or icterus OROPHARYNX: no thrush or ulceration; good dentition  NECK: supple, no masses felt LYMPH:  no palpable lymphadenopathy in the cervical, axillary or inguinal regions LUNGS: clear to auscultation and  No wheeze or crackles HEART/CVS: regular rate & rhythm and no murmurs; No lower extremity edema ABDOMEN:abdomen soft, non-tender and normal bowel sounds Musculoskeletal:no cyanosis of digits and no clubbing  PSYCH: alert & oriented x 3 with fluent speech NEURO: no focal motor/sensory deficits SKIN:  no rashes or significant lesions; mild swelling/fullness noted around the left posterior chest wall area.  Right and left BREAST exam [in the presence of nurse]- no unusual skin changes or dominant masses felt. Surgical scars noted.    LABORATORY DATA:  I have reviewed the data as listed    Component Value Date/Time   NA 134 (L) 08/03/2016 1109   NA 140 09/25/2013 1425   K 4.0 08/03/2016 1109   K 4.0 09/25/2013 1425   CL 101 08/03/2016 1109   CL 103 09/25/2013 1425   CO2 28 08/03/2016 1109   CO2 29 09/25/2013 1425   GLUCOSE 97 08/03/2016 1109   GLUCOSE 92 09/25/2013 1425   BUN 17 08/03/2016 1109   BUN 17  09/25/2013 1425   CREATININE 0.79 08/03/2016 1109   CREATININE 0.87 09/25/2013 1425   CALCIUM 9.5 08/03/2016 1109   CALCIUM 10.1 09/25/2013 1425   PROT 6.9 08/03/2016 1109   PROT 7.2 09/25/2013 1425   ALBUMIN 4.1 08/03/2016 1109   ALBUMIN 3.9 09/25/2013 1425   AST 20 08/03/2016 1109   AST 30 09/25/2013 1425   ALT 24 08/03/2016 1109   ALT 55 09/25/2013 1425   ALKPHOS 103 08/03/2016 1109   ALKPHOS 154 (H) 09/25/2013 1425   BILITOT 0.5 08/03/2016 1109   BILITOT 0.4 09/25/2013 1425   GFRNONAA >60 08/03/2016 1109   GFRNONAA >60 09/25/2013 1425  GFRAA >60 08/03/2016 1109   GFRAA >60 09/25/2013 1425    No results found for: SPEP, UPEP  Lab Results  Component Value Date   WBC 5.6 08/03/2016   NEUTROABS 3.1 08/03/2016   HGB 13.2 08/03/2016   HCT 37.9 08/03/2016   MCV 91.9 08/03/2016   PLT 291 08/03/2016      Chemistry      Component Value Date/Time   NA 134 (L) 08/03/2016 1109   NA 140 09/25/2013 1425   K 4.0 08/03/2016 1109   K 4.0 09/25/2013 1425   CL 101 08/03/2016 1109   CL 103 09/25/2013 1425   CO2 28 08/03/2016 1109   CO2 29 09/25/2013 1425   BUN 17 08/03/2016 1109   BUN 17 09/25/2013 1425   CREATININE 0.79 08/03/2016 1109   CREATININE 0.87 09/25/2013 1425      Component Value Date/Time   CALCIUM 9.5 08/03/2016 1109   CALCIUM 10.1 09/25/2013 1425   ALKPHOS 103 08/03/2016 1109   ALKPHOS 154 (H) 09/25/2013 1425   AST 20 08/03/2016 1109   AST 30 09/25/2013 1425   ALT 24 08/03/2016 1109   ALT 55 09/25/2013 1425   BILITOT 0.5 08/03/2016 1109   BILITOT 0.4 09/25/2013 1425        ASSESSMENT & PLAN:   Carcinoma of overlapping sites of left breast in female, estrogen receptor positive ([place]) # stage I ER/PR positive HER-2/neu positive breast cancer currently on adjuvant femara.  Clinically no evidence of recurrence. Awaiting mammogram end of the month/appointment with surgery.  # hot flashes- grade 1; improved continue On Effexor  # left breast pain- no  masses felt. Chronic neuropathic pain post lumpectomy. Await mammogram  # Left chest wall swelling/tightness- question lymphedema. Recommend referral to physical therapy.   # Osteopenia- 2014- BMD; on ca+ vit D; will repeat prior to next visit.   # follow up in 6 months. Labs/ ca-27-29. Labs- normal today.        Cammie Sickle, MD 08/03/2016 1:07 PM

## 2016-08-03 NOTE — Telephone Encounter (Signed)
x

## 2016-08-03 NOTE — Progress Notes (Signed)
Patient is here today for a follow up.  Patient states she has noticed swelling and pain under left arm, as well as left breast.

## 2016-08-04 LAB — CANCER ANTIGEN 27.29: CA 27.29: 9.3 U/mL (ref 0.0–38.6)

## 2016-08-10 ENCOUNTER — Ambulatory Visit: Payer: Self-pay | Admitting: Internal Medicine

## 2016-08-10 ENCOUNTER — Other Ambulatory Visit: Payer: Self-pay | Admitting: Internal Medicine

## 2016-08-10 ENCOUNTER — Other Ambulatory Visit: Payer: Self-pay

## 2016-08-17 ENCOUNTER — Ambulatory Visit
Admission: RE | Admit: 2016-08-17 | Discharge: 2016-08-17 | Disposition: A | Discharge status: Home / Self Care | Payer: Federal, State, Local not specified - PPO | Source: Ambulatory Visit | Attending: General Surgery | Admitting: General Surgery

## 2016-08-17 DIAGNOSIS — Z170 Estrogen receptor positive status [ER+]: Principal | ICD-10-CM

## 2016-08-17 DIAGNOSIS — C50812 Malignant neoplasm of overlapping sites of left female breast: Secondary | ICD-10-CM | POA: Diagnosis present

## 2016-08-17 HISTORY — DX: Personal history of antineoplastic chemotherapy: Z92.21

## 2016-08-17 HISTORY — DX: Personal history of irradiation: Z92.3

## 2016-08-24 ENCOUNTER — Inpatient Hospital Stay: Payer: Self-pay

## 2016-08-24 ENCOUNTER — Encounter: Payer: Self-pay | Admitting: General Surgery

## 2016-08-24 ENCOUNTER — Ambulatory Visit: Payer: Federal, State, Local not specified - PPO | Admitting: Occupational Therapy

## 2016-08-24 ENCOUNTER — Ambulatory Visit (INDEPENDENT_AMBULATORY_CARE_PROVIDER_SITE_OTHER): Payer: Federal, State, Local not specified - PPO | Admitting: General Surgery

## 2016-08-24 VITALS — BP 120/82 | HR 92 | Resp 12 | Ht 62.0 in | Wt 181.0 lb

## 2016-08-24 DIAGNOSIS — N644 Mastodynia: Secondary | ICD-10-CM | POA: Insufficient documentation

## 2016-08-24 DIAGNOSIS — Z853 Personal history of malignant neoplasm of breast: Secondary | ICD-10-CM | POA: Diagnosis not present

## 2016-08-24 NOTE — Patient Instructions (Signed)
The patient is aware to call back for any questions or concerns.  

## 2016-08-24 NOTE — Progress Notes (Addendum)
Patient ID: Katrina Holland, female   DOB: [birth year], 56 y.o.   MRN: [id number]  Chief Complaint  Patient presents with  . Follow-up    mammogram     HPI Katrina Holland is a 56 y.o. female.  who presents for a her follow up breast cancer and breast evaluation. The most recent mammogram was done on 08-17-16.  Patient does perform regular self breast checks and gets regular mammograms done.   Tolerating letrozole. She does have one spot inner left breast that is like a "tooth ache".  She did experience vertigo several months ago that lasted for 3 weeks treated with meclizine and CT scan 07-08-16 and none since.  HPI  Past Medical History:  Diagnosis Date  . Anxiety   . Asthma   . Breast CA ([place])   . Endometriosis 2001  . GERD (gastroesophageal reflux disease)   . Irritable bowel syndrome   . Malignant neoplasm of upper-outer quadrant of female breast ([place]) 01/30/2012   Left breast, Invasive mammary cancer, no special type: T1c,N0,M0; ER 90%, PR 90%, no amplification of HER-2/neu, aromatase inhibitors discontinued September 2015 secondary to joint symptoms, tamoxifen initiated by Dr. Ma Hillock..  . Personal history of chemotherapy   . Personal history of radiation therapy   . Pulmonary hypertension ([place]) 2013  . Sleep apnea 2007  . Sleep apnea 2007    Past Surgical History:  Procedure Laterality Date  . BREAST BIOPSY Left 2013   neg  . BREAST BIOPSY Left 01/14   +  . BREAST BIOPSY Left 12/14   FNA   . BREAST ENHANCEMENT SURGERY  2014,05/2013  . BREAST SURGERY Left 01/30/2012   Wide excision, sentinel node biopsy  . BREAST SURGERY Left 02/09/2012   Left breast reduction/reconstruction: Nicholaus Bloom, MD.  . BREAST SURGERY Left December 26, 2012   ultrasound-guided biopsy, post surgical change.  . COLONOSCOPY  2012  . DILATION AND CURETTAGE OF UTERUS  2011  . EXPLORATORY LAPAROTOMY  2001  . FACIAL COSMETIC SURGERY  2015  . HERNIA REPAIR  1997  . NISSEN FUNDOPLICATION  3536  .  OVARY SURGERY Right 2001  . PORT A CATH REVISION  2014  . REDUCTION MAMMAPLASTY Left 2014  . REDUCTION MAMMAPLASTY Right 2015  . RIGHT HEART CATH  2013  . TONSILLECTOMY  1969    Family History  Problem Relation Age of Onset  . Colon cancer Paternal Grandfather   . Breast cancer Maternal Aunt 103  . Breast cancer Cousin 14  . Breast cancer Cousin 27  . Breast cancer Cousin 102    Social History Social History  Substance Use Topics  . Smoking status: Former Smoker    Packs/day: 0.10    Years: 25.00    Types: Cigarettes    Quit date: 01/11/2007  . Smokeless tobacco: Never Used     Comment: "social smoker" per pt.  . Alcohol use No    Allergies  Allergen Reactions  . Augmentin [Amoxicillin-Pot Clavulanate]     "makes me sick"    Current Outpatient Prescriptions  Medication Sig Dispense Refill  . acyclovir (ZOVIRAX) 400 MG tablet Take 400 mg by mouth as needed.     . diclofenac (VOLTAREN) 75 MG EC tablet Take 75 mg by mouth 2 (two) times daily.     Marland Kitchen docusate sodium (COLACE) 100 MG capsule Take 100 mg by mouth daily as needed.     Marland Kitchen letrozole (FEMARA) 2.5 MG tablet TAKE 1 TABLET BY MOUTH ONCE DAILY  90 tablet 1  . montelukast (SINGULAIR) 10 MG tablet TAKE ONE TABLET BY MOUTH AT BEDTIME 30 tablet 0  . phentermine (ADIPEX-P) 37.5 MG tablet     . venlafaxine (EFFEXOR) 75 MG tablet TAKE ONE TABLET BY MOUTH TWICE DAILY WITH MEALS 60 tablet 3   No current facility-administered medications for this visit.     Review of Systems Review of Systems  Constitutional: Negative.   Respiratory: Negative.   Cardiovascular: Negative.     Blood pressure 120/82, pulse 92, resp. rate 12, height _0  (1.575 m), weight 181 lb (82.1 kg).  Physical Exam Physical Exam  Constitutional: She is oriented to person, place, and time. She appears well-developed and well-nourished.  HENT:  Mouth/Throat: Oropharynx is clear and moist.  Eyes: Conjunctivae are normal. No scleral icterus.  Neck:  Neck supple.  Cardiovascular: Normal rate, regular rhythm and normal heart sounds.   Pulmonary/Chest: Effort normal and breath sounds normal. Right breast exhibits no inverted nipple, no mass, no nipple discharge, no skin change and no tenderness. Left breast exhibits tenderness. Left breast exhibits no inverted nipple, no mass, no nipple discharge and no skin change.    Tenderness left breast 8 o'clock 7 CFN and 8 mm thickening  Musculoskeletal:       Arms: Lymphadenopathy:    She has no cervical adenopathy.    She has no axillary adenopathy.  Neurological: She is alert and oriented to person, place, and time.  Skin: Skin is warm and dry.  Psychiatric: Her behavior is normal.    Data Reviewed Bilateral mammograms of 08/17/2016 were reviewed. Calcifications in the surgical site for which six-month follow-up was recommended. BI-RADS-3.  Ultrasound examination of the left breast in the lower inner quadrant was completed based on the area of focal tenderness and a faint nodular area at the 8:00 position. Careful scanning in radial in anti-radial directions throughout the area showed no discernible mass or evidence of architectural distortion. BI-RADS-2.  Assessment    Benign breast exam. Focal tenderness associated with fatty inflammation rather than parenchymal mass.    Plan    There is less than or equal 2 cm in difference between the right and left upper extremities, and this time observation alone is warranted.  Microcalcifications associated with prior surgical intervention radiation.  Opportunity for 6 versus 12 month follow-up on mammograms reviewed. Patient would like to proceed with six-month follow-up on the left breast.  The patient will be scheduled for a left breast diagnostic mammogram in 6 months. She was encouraged to call the day of the exams of the films can be independently reviewed.  We'll plan for an office examination and bilateral diagnostic mammograms 12  months.     The patient has been asked to return to the office in one year with a bilateral diagnostic mammogram.   HPI, Physical Exam, Assessment and Plan have been scribed under the direction and in the presence of Robert Bellow, MD. Karie Fetch, RN  I have completed the exam and reviewed the above documentation for accuracy and completeness.  I agree with the above.  Haematologist has been used and any errors in dictation or transcription are unintentional.  Hervey Ard, M.D., F.A.C.S.  Robert Bellow 08/24/2016, 7:55 PM

## 2016-09-05 ENCOUNTER — Other Ambulatory Visit: Payer: Self-pay | Admitting: Internal Medicine

## 2016-09-14 ENCOUNTER — Telehealth: Payer: Self-pay | Admitting: *Deleted

## 2016-09-14 NOTE — Telephone Encounter (Signed)
error 

## 2016-12-22 ENCOUNTER — Other Ambulatory Visit: Payer: Self-pay

## 2016-12-22 DIAGNOSIS — Z170 Estrogen receptor positive status [ER+]: Principal | ICD-10-CM

## 2016-12-22 DIAGNOSIS — C50812 Malignant neoplasm of overlapping sites of left female breast: Secondary | ICD-10-CM

## 2017-01-04 ENCOUNTER — Other Ambulatory Visit: Payer: Self-pay | Admitting: Internal Medicine

## 2017-02-01 ENCOUNTER — Encounter: Payer: Self-pay | Admitting: Internal Medicine

## 2017-02-01 ENCOUNTER — Inpatient Hospital Stay (HOSPITAL_BASED_OUTPATIENT_CLINIC_OR_DEPARTMENT_OTHER): Payer: Federal, State, Local not specified - PPO | Admitting: Internal Medicine

## 2017-02-01 ENCOUNTER — Inpatient Hospital Stay: Payer: Federal, State, Local not specified - PPO | Attending: Internal Medicine

## 2017-02-01 VITALS — BP 123/85 | HR 89 | Temp 96.7°F | Resp 16 | Wt 198.0 lb

## 2017-02-01 DIAGNOSIS — M8580 Other specified disorders of bone density and structure, unspecified site: Secondary | ICD-10-CM

## 2017-02-01 DIAGNOSIS — C50812 Malignant neoplasm of overlapping sites of left female breast: Secondary | ICD-10-CM

## 2017-02-01 DIAGNOSIS — Z170 Estrogen receptor positive status [ER+]: Secondary | ICD-10-CM | POA: Diagnosis not present

## 2017-02-01 DIAGNOSIS — Z9221 Personal history of antineoplastic chemotherapy: Secondary | ICD-10-CM | POA: Insufficient documentation

## 2017-02-01 DIAGNOSIS — N644 Mastodynia: Secondary | ICD-10-CM | POA: Diagnosis not present

## 2017-02-01 DIAGNOSIS — J069 Acute upper respiratory infection, unspecified: Secondary | ICD-10-CM

## 2017-02-01 DIAGNOSIS — Z79811 Long term (current) use of aromatase inhibitors: Secondary | ICD-10-CM

## 2017-02-01 DIAGNOSIS — Z79899 Other long term (current) drug therapy: Secondary | ICD-10-CM

## 2017-02-01 DIAGNOSIS — Z803 Family history of malignant neoplasm of breast: Secondary | ICD-10-CM

## 2017-02-01 DIAGNOSIS — R232 Flushing: Secondary | ICD-10-CM

## 2017-02-01 DIAGNOSIS — Z880 Allergy status to penicillin: Secondary | ICD-10-CM

## 2017-02-01 DIAGNOSIS — Z90721 Acquired absence of ovaries, unilateral: Secondary | ICD-10-CM

## 2017-02-01 DIAGNOSIS — K219 Gastro-esophageal reflux disease without esophagitis: Secondary | ICD-10-CM | POA: Insufficient documentation

## 2017-02-01 DIAGNOSIS — Z87891 Personal history of nicotine dependence: Secondary | ICD-10-CM | POA: Diagnosis not present

## 2017-02-01 DIAGNOSIS — Z923 Personal history of irradiation: Secondary | ICD-10-CM | POA: Insufficient documentation

## 2017-02-01 LAB — COMPREHENSIVE METABOLIC PANEL
ALT: 25 U/L (ref 14–54)
AST: 25 U/L (ref 15–41)
Albumin: 3.8 g/dL (ref 3.5–5.0)
Alkaline Phosphatase: 130 U/L — ABNORMAL HIGH (ref 38–126)
Anion gap: 5 (ref 5–15)
BUN: 15 mg/dL (ref 6–20)
CO2: 24 mmol/L (ref 22–32)
Calcium: 9 mg/dL (ref 8.9–10.3)
Chloride: 105 mmol/L (ref 101–111)
Creatinine, Ser: 0.73 mg/dL (ref 0.44–1.00)
GFR calc Af Amer: >60 mL/min (ref >60)
GFR calc non Af Amer: >60 mL/min (ref >60)
Glucose, Bld: 120 mg/dL — ABNORMAL HIGH (ref 65–99)
Potassium: 4.1 mmol/L (ref 3.5–5.1)
Sodium: 134 mmol/L — ABNORMAL LOW (ref 135–145)
Total Bilirubin: 0.5 mg/dL (ref 0.3–1.2)
Total Protein: 6.9 g/dL (ref 6.5–8.1)

## 2017-02-01 LAB — CBC WITH DIFFERENTIAL/PLATELET
Basophils Absolute: 0 10*3/uL (ref 0–0.1)
Basophils Relative: 1 %
Eosinophils Absolute: 0.1 10*3/uL (ref 0–0.7)
Eosinophils Relative: 2 %
HCT: 38.8 % (ref 35.0–47.0)
Hemoglobin: 13.1 g/dL (ref 12.0–16.0)
Lymphocytes Relative: 34 %
Lymphs Abs: 1.9 10*3/uL (ref 1.0–3.6)
MCH: 31.2 pg (ref 26.0–34.0)
MCHC: 33.8 g/dL (ref 32.0–36.0)
MCV: 92.3 fL (ref 80.0–100.0)
Monocytes Absolute: 0.4 10*3/uL (ref 0.2–0.9)
Monocytes Relative: 7 %
Neutro Abs: 3.2 10*3/uL (ref 1.4–6.5)
Neutrophils Relative %: 56 %
Platelets: 317 10*3/uL (ref 150–440)
RBC: 4.21 MIL/uL (ref 3.80–5.20)
RDW: 12.4 % (ref 11.5–14.5)
WBC: 5.7 10*3/uL (ref 3.6–11.0)

## 2017-02-01 MED ORDER — AZITHROMYCIN 250 MG PO TABS: ORAL_TABLET | ORAL | 0 refills | Status: DC

## 2017-02-01 NOTE — Progress Notes (Signed)
[place] OFFICE PROGRESS NOTE  Patient Care Team: Glendon Axe, MD as PCP - General (Internal Medicine) Bary Castilla, Forest Gleason, MD (General Surgery) Dear, [name], MD (Inactive) (Family Medicine)   SUMMARY OF ONCOLOGIC HISTORY:  Oncology History   # JAN 2014- LEFT BREAST CA IDC; pT1c (1.2cm) pN0 [Stage I] ER/PR > 90%; her 2 Neu POS; AC x4- Taxol-Herceptin; Intol to AI; March 2015- Started TAM [stopped- Sep 2017]; OCT 2017- Post menopausal [estradiol/FSH]; NOV 1st 2017- Start ARIMIDEX  # DEC 2016- Start Effexor   # Right oopherectomy; Left intact ovary-intact [last periods late 90s]     Carcinoma of overlapping sites of left breast in female, estrogen receptor positive ([place])   09/23/2015 Initial Diagnosis    Cancer of overlapping sites of left female breast [facility])       INTERVAL HISTORY:  A very pleasant 57 year old female patient with above history of stage I breast cancer triple positive currently on adjuvant arimidex Is here for follow-up.  Patient complains of recent cough with phlegm.  No fevers.  Not improving over the last 3 weeks.  Otherwise denies any new lumps or bumps.  REVIEW OF SYSTEMS:  A complete 10 point review of system is done which is negative except mentioned above/history of present illness.   PAST MEDICAL HISTORY :  Past Medical History:  Diagnosis Date  . Anxiety   . Asthma   . Breast CA ([place])   . Endometriosis 2001  . GERD (gastroesophageal reflux disease)   . Irritable bowel syndrome   . Malignant neoplasm of upper-outer quadrant of female breast ([place]) 01/30/2012   Left breast, Invasive mammary cancer, no special type: T1c,N0,M0; ER 90%, PR 90%, no amplification of HER-2/neu, aromatase inhibitors discontinued September 2015 secondary to joint symptoms, tamoxifen initiated by Dr. Ma Hillock..  . Personal history of chemotherapy   . Personal history of radiation therapy   . Pulmonary hypertension ([place]) 2013  . Sleep apnea 2007  . Sleep  apnea 2007    PAST SURGICAL HISTORY :   Past Surgical History:  Procedure Laterality Date  . BREAST BIOPSY Left 2013   neg  . BREAST BIOPSY Left 01/14   +  . BREAST BIOPSY Left 12/14   FNA   . BREAST ENHANCEMENT SURGERY  2014,05/2013  . BREAST SURGERY Left 01/30/2012   Wide excision, sentinel node biopsy  . BREAST SURGERY Left 02/09/2012   Left breast reduction/reconstruction: Nicholaus Bloom, MD.  . BREAST SURGERY Left December 26, 2012   ultrasound-guided biopsy, post surgical change.  . COLONOSCOPY  2012  . DILATION AND CURETTAGE OF UTERUS  2011  . EXPLORATORY LAPAROTOMY  2001  . FACIAL COSMETIC SURGERY  2015  . HERNIA REPAIR  1997  . NISSEN FUNDOPLICATION  8270  . OVARY SURGERY Right 2001  . PORT A CATH REVISION  2014  . REDUCTION MAMMAPLASTY Left 2014  . REDUCTION MAMMAPLASTY Right 2015  . RIGHT HEART CATH  2013  . TONSILLECTOMY  1969    FAMILY HISTORY :   Family History  Problem Relation Age of Onset  . Colon cancer Paternal Grandfather   . Breast cancer Maternal Aunt 4  . Breast cancer Cousin 40  . Breast cancer Cousin 102  . Breast cancer Cousin 71    SOCIAL HISTORY:   Social History   Tobacco Use  . Smoking status: Former Smoker    Packs/day: 0.10    Years: 25.00    Pack years: 2.50    Types: Cigarettes  Last attempt to quit: 01/11/2007    Years since quitting: 10.0  . Smokeless tobacco: Never Used  . Tobacco comment: "social smoker" per pt.  Substance Use Topics  . Alcohol use: No  . Drug use: No    ALLERGIES:  is allergic to augmentin [amoxicillin-pot clavulanate].  MEDICATIONS:  Current Outpatient Medications  Medication Sig Dispense Refill  . acyclovir (ZOVIRAX) 400 MG tablet Take 400 mg by mouth as needed.     . diclofenac (VOLTAREN) 75 MG EC tablet Take 75 mg by mouth 2 (two) times daily.     Marland Kitchen docusate sodium (COLACE) 100 MG capsule Take 100 mg by mouth daily as needed.     Marland Kitchen letrozole (FEMARA) 2.5 MG tablet TAKE 1 TABLET BY MOUTH ONCE  DAILY 90 tablet 1  . montelukast (SINGULAIR) 10 MG tablet TAKE ONE TABLET BY MOUTH AT BEDTIME 30 tablet 0  . venlafaxine (EFFEXOR) 75 MG tablet TAKE ONE TABLET BY MOUTH TWICE DAILY WITH MEALS 60 tablet 3  . azithromycin (ZITHROMAX) 250 MG tablet Take 2 on day 1; and then 1 pill once a day. 6 each 0  . phentermine (ADIPEX-P) 37.5 MG tablet      No current facility-administered medications for this visit.     PHYSICAL EXAMINATION: ECOG PERFORMANCE STATUS: 0 - Asymptomatic  BP 123/85 (BP Location: Left Arm, Patient Position: Sitting)   Pulse 89   Temp (!) 96.7 F (35.9 C) (Tympanic)   Resp 16   Wt 198 lb (89.8 kg)   BMI 36.21 kg/m   Filed Weights   02/01/17 1209  Weight: 198 lb (89.8 kg)    GENERAL: Well-nourished well-developed; Alert, no distress and comfortable.   Alone.  EYES: no pallor or icterus OROPHARYNX: no thrush or ulceration; good dentition  NECK: supple, no masses felt LYMPH:  no palpable lymphadenopathy in the cervical, axillary or inguinal regions LUNGS: clear to auscultation and  No wheeze or crackles HEART/CVS: regular rate & rhythm and no murmurs; No lower extremity edema ABDOMEN:abdomen soft, non-tender and normal bowel sounds Musculoskeletal:no cyanosis of digits and no clubbing  PSYCH: alert & oriented x 3 with fluent speech NEURO: no focal motor/sensory deficits SKIN:  no rashes or significant lesions; mild swelling/fullness noted around the left posterior chest wall area.  Right and left BREAST exam [in the presence of nurse]- no unusual skin changes or dominant masses felt. Surgical scars noted.    LABORATORY DATA:  I have reviewed the data as listed    Component Value Date/Time   NA 134 (L) 02/01/2017 1130   NA 140 09/25/2013 1425   K 4.1 02/01/2017 1130   K 4.0 09/25/2013 1425   CL 105 02/01/2017 1130   CL 103 09/25/2013 1425   CO2 24 02/01/2017 1130   CO2 29 09/25/2013 1425   GLUCOSE 120 (H) 02/01/2017 1130   GLUCOSE 92 09/25/2013 1425    BUN 15 02/01/2017 1130   BUN 17 09/25/2013 1425   CREATININE 0.73 02/01/2017 1130   CREATININE 0.87 09/25/2013 1425   CALCIUM 9.0 02/01/2017 1130   CALCIUM 10.1 09/25/2013 1425   PROT 6.9 02/01/2017 1130   PROT 7.2 09/25/2013 1425   ALBUMIN 3.8 02/01/2017 1130   ALBUMIN 3.9 09/25/2013 1425   AST 25 02/01/2017 1130   AST 30 09/25/2013 1425   ALT 25 02/01/2017 1130   ALT 55 09/25/2013 1425   ALKPHOS 130 (H) 02/01/2017 1130   ALKPHOS 154 (H) 09/25/2013 1425   BILITOT 0.5 02/01/2017 1130  BILITOT 0.4 09/25/2013 1425   GFRNONAA >60 02/01/2017 1130   GFRNONAA >60 09/25/2013 1425   GFRAA >60 02/01/2017 1130   GFRAA >60 09/25/2013 1425    No results found for: SPEP, UPEP  Lab Results  Component Value Date   WBC 5.7 02/01/2017   NEUTROABS 3.2 02/01/2017   HGB 13.1 02/01/2017   HCT 38.8 02/01/2017   MCV 92.3 02/01/2017   PLT 317 02/01/2017      Chemistry      Component Value Date/Time   NA 134 (L) 02/01/2017 1130   NA 140 09/25/2013 1425   K 4.1 02/01/2017 1130   K 4.0 09/25/2013 1425   CL 105 02/01/2017 1130   CL 103 09/25/2013 1425   CO2 24 02/01/2017 1130   CO2 29 09/25/2013 1425   BUN 15 02/01/2017 1130   BUN 17 09/25/2013 1425   CREATININE 0.73 02/01/2017 1130   CREATININE 0.87 09/25/2013 1425      Component Value Date/Time   CALCIUM 9.0 02/01/2017 1130   CALCIUM 10.1 09/25/2013 1425   ALKPHOS 130 (H) 02/01/2017 1130   ALKPHOS 154 (H) 09/25/2013 1425   AST 25 02/01/2017 1130   AST 30 09/25/2013 1425   ALT 25 02/01/2017 1130   ALT 55 09/25/2013 1425   BILITOT 0.5 02/01/2017 1130   BILITOT 0.4 09/25/2013 1425        ASSESSMENT & PLAN:   Carcinoma of overlapping sites of left breast in female, estrogen receptor positive ([place]) # stage I ER/PR positive HER-2/neu positive breast cancer currently on adjuvant femara.  Clinically no evidence of recurrence.   # hot flashes- grade 1; improved continue Effexor.  # URI- cough/phelegm; no fevers-Z-Pak.  #  left breast pain- no masses felt. Chronic neuropathic pain post lumpectomy. Await mammogram  # Left chest wall swelling/tightness- question lymphedema. Recommend referral to physical therapy.   # Osteopenia- 2017 June BMD; continue ca+vitD.   # follow up in 6 months. Labs/ ca-27-29. Labs- normal today except for mild alk pos.        Cammie Sickle, MD 02/07/2017 7:11 PM

## 2017-02-01 NOTE — Assessment & Plan Note (Addendum)
#  stage I ER/PR positive HER-2/neu positive breast cancer currently on adjuvant femara.  Clinically no evidence of recurrence.   # hot flashes- grade 1; improved continue Effexor.  # URI- cough/phelegm; no fevers-Z-Pak.  # left breast pain- no masses felt. Chronic neuropathic pain post lumpectomy. Await mammogram  # Left chest wall swelling/tightness- question lymphedema. Recommend referral to physical therapy.   # Osteopenia- 2017 June BMD; continue ca+vitD.   # follow up in 6 months. Labs/ ca-27-29. Labs- normal today except for mild alk pos.

## 2017-02-02 LAB — CANCER ANTIGEN 27.29: CA 27.29: 13.3 U/mL (ref 0.0–38.6)

## 2017-02-22 ENCOUNTER — Ambulatory Visit
Admission: RE | Admit: 2017-02-22 | Discharge: 2017-02-22 | Disposition: A | Discharge status: Home / Self Care | Payer: Federal, State, Local not specified - PPO | Source: Ambulatory Visit | Attending: General Surgery | Admitting: General Surgery

## 2017-02-22 ENCOUNTER — Telehealth: Payer: Self-pay | Admitting: *Deleted

## 2017-02-22 DIAGNOSIS — C50812 Malignant neoplasm of overlapping sites of left female breast: Secondary | ICD-10-CM

## 2017-02-22 DIAGNOSIS — R921 Mammographic calcification found on diagnostic imaging of breast: Secondary | ICD-10-CM | POA: Diagnosis not present

## 2017-02-22 DIAGNOSIS — Z9889 Other specified postprocedural states: Secondary | ICD-10-CM | POA: Insufficient documentation

## 2017-02-22 DIAGNOSIS — Z853 Personal history of malignant neoplasm of breast: Secondary | ICD-10-CM | POA: Diagnosis not present

## 2017-02-22 DIAGNOSIS — Z170 Estrogen receptor positive status [ER+]: Principal | ICD-10-CM

## 2017-02-22 DIAGNOSIS — R928 Other abnormal and inconclusive findings on diagnostic imaging of breast: Secondary | ICD-10-CM | POA: Diagnosis not present

## 2017-02-22 HISTORY — DX: Malignant neoplasm of unspecified site of unspecified female breast: C50.919

## 2017-02-22 NOTE — Telephone Encounter (Signed)
Left detailed message on phone, as instructed. Pt in recalls for follow-up appointment in fall.

## 2017-02-22 NOTE — Telephone Encounter (Signed)
-----   Message from Robert Bellow, MD sent at 02/22/2017  3:03 PM EST ----- Please notify the patient the radiologists concern is back to baseline. F/u with bilateral diag mammograms in fall with OV to follow. Thanks.

## 2017-02-23 NOTE — Telephone Encounter (Signed)
Notified patient as instructed, patient pleased. Discussed follow-up appointments, patient agrees  

## 2017-04-04 ENCOUNTER — Other Ambulatory Visit: Payer: Self-pay | Admitting: Internal Medicine

## 2017-05-06 ENCOUNTER — Other Ambulatory Visit: Payer: Self-pay | Admitting: Internal Medicine

## 2017-07-05 ENCOUNTER — Other Ambulatory Visit: Payer: Self-pay

## 2017-07-05 DIAGNOSIS — Z170 Estrogen receptor positive status [ER+]: Principal | ICD-10-CM

## 2017-07-05 DIAGNOSIS — C50812 Malignant neoplasm of overlapping sites of left female breast: Secondary | ICD-10-CM

## 2017-07-28 ENCOUNTER — Other Ambulatory Visit: Payer: Self-pay | Admitting: *Deleted

## 2017-07-28 DIAGNOSIS — Z170 Estrogen receptor positive status [ER+]: Principal | ICD-10-CM

## 2017-07-28 DIAGNOSIS — C50812 Malignant neoplasm of overlapping sites of left female breast: Secondary | ICD-10-CM

## 2017-08-02 ENCOUNTER — Inpatient Hospital Stay: Payer: Federal, State, Local not specified - PPO

## 2017-08-02 ENCOUNTER — Encounter: Payer: Self-pay | Admitting: Internal Medicine

## 2017-08-02 ENCOUNTER — Inpatient Hospital Stay: Payer: Federal, State, Local not specified - PPO | Attending: Internal Medicine | Admitting: Internal Medicine

## 2017-08-02 VITALS — BP 112/78 | HR 71 | Temp 97.1°F | Resp 16

## 2017-08-02 DIAGNOSIS — Z800 Family history of malignant neoplasm of digestive organs: Secondary | ICD-10-CM

## 2017-08-02 DIAGNOSIS — R5383 Other fatigue: Secondary | ICD-10-CM | POA: Diagnosis not present

## 2017-08-02 DIAGNOSIS — G4733 Obstructive sleep apnea (adult) (pediatric): Secondary | ICD-10-CM

## 2017-08-02 DIAGNOSIS — Z79811 Long term (current) use of aromatase inhibitors: Secondary | ICD-10-CM | POA: Insufficient documentation

## 2017-08-02 DIAGNOSIS — Z90721 Acquired absence of ovaries, unilateral: Secondary | ICD-10-CM | POA: Diagnosis not present

## 2017-08-02 DIAGNOSIS — C50812 Malignant neoplasm of overlapping sites of left female breast: Secondary | ICD-10-CM

## 2017-08-02 DIAGNOSIS — G893 Neoplasm related pain (acute) (chronic): Secondary | ICD-10-CM | POA: Insufficient documentation

## 2017-08-02 DIAGNOSIS — Z170 Estrogen receptor positive status [ER+]: Secondary | ICD-10-CM | POA: Diagnosis not present

## 2017-08-02 DIAGNOSIS — M8580 Other specified disorders of bone density and structure, unspecified site: Secondary | ICD-10-CM | POA: Insufficient documentation

## 2017-08-02 DIAGNOSIS — Z923 Personal history of irradiation: Secondary | ICD-10-CM | POA: Diagnosis not present

## 2017-08-02 DIAGNOSIS — Z9221 Personal history of antineoplastic chemotherapy: Secondary | ICD-10-CM | POA: Diagnosis not present

## 2017-08-02 DIAGNOSIS — I272 Pulmonary hypertension, unspecified: Secondary | ICD-10-CM | POA: Diagnosis not present

## 2017-08-02 DIAGNOSIS — Z79899 Other long term (current) drug therapy: Secondary | ICD-10-CM

## 2017-08-02 DIAGNOSIS — R5381 Other malaise: Secondary | ICD-10-CM | POA: Diagnosis not present

## 2017-08-02 DIAGNOSIS — Z803 Family history of malignant neoplasm of breast: Secondary | ICD-10-CM | POA: Diagnosis not present

## 2017-08-02 DIAGNOSIS — Z87891 Personal history of nicotine dependence: Secondary | ICD-10-CM

## 2017-08-02 DIAGNOSIS — K219 Gastro-esophageal reflux disease without esophagitis: Secondary | ICD-10-CM | POA: Insufficient documentation

## 2017-08-02 DIAGNOSIS — R232 Flushing: Secondary | ICD-10-CM

## 2017-08-02 LAB — COMPREHENSIVE METABOLIC PANEL
ALT: 31 U/L (ref 0–44)
AST: 26 U/L (ref 15–41)
Albumin: 4.1 g/dL (ref 3.5–5.0)
Alkaline Phosphatase: 124 U/L (ref 38–126)
Anion gap: 8 (ref 5–15)
BUN: 17 mg/dL (ref 6–20)
CO2: 22 mmol/L (ref 22–32)
Calcium: 9.8 mg/dL (ref 8.9–10.3)
Chloride: 108 mmol/L (ref 98–111)
Creatinine, Ser: 0.74 mg/dL (ref 0.44–1.00)
GFR calc Af Amer: >60 mL/min (ref >60)
GFR calc non Af Amer: >60 mL/min (ref >60)
Glucose, Bld: 100 mg/dL — ABNORMAL HIGH (ref 70–99)
Potassium: 4.1 mmol/L (ref 3.5–5.1)
Sodium: 138 mmol/L (ref 135–145)
Total Bilirubin: 0.6 mg/dL (ref 0.3–1.2)
Total Protein: 7 g/dL (ref 6.5–8.1)

## 2017-08-02 LAB — CBC WITH DIFFERENTIAL/PLATELET
Basophils Absolute: 0 10*3/uL (ref 0–0.1)
Basophils Relative: 1 %
Eosinophils Absolute: 0.1 10*3/uL (ref 0–0.7)
Eosinophils Relative: 1 %
HCT: 39.2 % (ref 35.0–47.0)
Hemoglobin: 13.4 g/dL (ref 12.0–16.0)
Lymphocytes Relative: 38 %
Lymphs Abs: 2.4 10*3/uL (ref 1.0–3.6)
MCH: 31.7 pg (ref 26.0–34.0)
MCHC: 34.2 g/dL (ref 32.0–36.0)
MCV: 92.7 fL (ref 80.0–100.0)
Monocytes Absolute: 0.4 10*3/uL (ref 0.2–0.9)
Monocytes Relative: 7 %
Neutro Abs: 3.4 10*3/uL (ref 1.4–6.5)
Neutrophils Relative %: 53 %
Platelets: 289 10*3/uL (ref 150–440)
RBC: 4.23 MIL/uL (ref 3.80–5.20)
RDW: 12.9 % (ref 11.5–14.5)
WBC: 6.4 10*3/uL (ref 3.6–11.0)

## 2017-08-02 MED ORDER — LETROZOLE 2.5 MG PO TABS: 2.5000 mg | ORAL_TABLET | Freq: Every day | ORAL | 3 refills | Status: DC

## 2017-08-02 NOTE — Progress Notes (Signed)
Patient c/o fatigue and some SHOB on exacerbation.

## 2017-08-02 NOTE — Assessment & Plan Note (Addendum)
#  Stage I ER/PR positive HER-2/neu positive breast cancer currently on adjuvant femara.  Clinically no evidence of recurrence.  Clinically STABLE.  Continue Femara.  # Hot flashes- grade 1; improved continue Effexor. STABLE.  # Fatigue-worse; TSH normal. likley OSA/defer to PCP.    # left breast pain- no masses felt. Chronic neuropathic pain post lumpectomy. Await mammogram. STABLE.   # Osteopenia- 2017 June BMD; continue ca+vitD. STABLE.   # follow up in 6 months. Labs/ ca-27-29.   Cc; Dr. Glendon Axe.

## 2017-08-02 NOTE — Progress Notes (Signed)
[place] OFFICE PROGRESS NOTE  Patient Care Team: Glendon Axe, MD as PCP - General (Internal Medicine) Bary Castilla Forest Gleason, MD (General Surgery) Dear, [name], MD (Inactive) (Family Medicine)  Cancer Staging No matching staging information was found for the patient.   Oncology History   # JAN 2014- LEFT BREAST CA IDC; pT1c (1.2cm) pN0 [Stage I] ER/PR > 90%; her 2 Neu POS; AC x4- Taxol-Herceptin; Intol to AI; March 2015- Started TAM [stopped- Sep 2017]; OCT 2017- Post menopausal [estradiol/FSH]; NOV 1st 2017- Start ARIMIDEX  # DEC 2016- Start Effexor   # Right oopherectomy; Left intact ovary-intact [last periods late 90s]     Carcinoma of overlapping sites of left breast in female, estrogen receptor positive ([place])      INTERVAL HISTORY:  Katrina Holland 57 y.o.  female pleasant patient above history of stage I breast cancer ER PR positive currently on adjuvant Arimidex is here for follow-up.  Patient continues to complain of significant fatigue for the last many months.  Progressively getting worse.  She states to have had a sleep study which was positive for obstructive sleep apnea; however patient is not using CPAP.  Patient noticed her fatigue is progressively getting worse over the last many months.  Review of Systems  Constitutional: Positive for malaise/fatigue. Negative for chills, diaphoresis, fever and weight loss.  HENT: Negative for nosebleeds and sore throat.   Eyes: Negative for double vision.  Respiratory: Negative for cough, hemoptysis, sputum production, shortness of breath and wheezing.   Cardiovascular: Negative for palpitations, orthopnea and leg swelling.  Gastrointestinal: Negative for abdominal pain, blood in stool, constipation, diarrhea, heartburn, melena, nausea and vomiting.  Genitourinary: Negative for dysuria, frequency and urgency.  Musculoskeletal: Negative for back pain and joint pain.  Skin: Negative.  Negative for itching and  rash.       Chronic left breast pain at site of incision.  Neurological: Negative for dizziness, tingling, focal weakness, weakness and headaches.  Endo/Heme/Allergies: Does not bruise/bleed easily.  Psychiatric/Behavioral: Negative for depression. The patient is not nervous/anxious and does not have insomnia.       PAST MEDICAL HISTORY :  Past Medical History:  Diagnosis Date  . Anxiety   . Asthma   . Breast CA ([place])   . Breast cancer ([place]) 2014  . Endometriosis 2001  . GERD (gastroesophageal reflux disease)   . Irritable bowel syndrome   . Malignant neoplasm of upper-outer quadrant of female breast ([place]) 01/30/2012   Left breast, Invasive mammary cancer, no special type: T1c,N0,M0; ER 90%, PR 90%, no amplification of HER-2/neu, aromatase inhibitors discontinued September 2015 secondary to joint symptoms, tamoxifen initiated by Dr. Ma Hillock..  . Personal history of chemotherapy   . Personal history of radiation therapy   . Pulmonary hypertension ([place]) 2013  . Sleep apnea 2007  . Sleep apnea 2007    PAST SURGICAL HISTORY :   Past Surgical History:  Procedure Laterality Date  . BREAST BIOPSY Left 2013   neg  . BREAST BIOPSY Left 01/14   +  . BREAST BIOPSY Left 12/14   FNA   . BREAST ENHANCEMENT SURGERY  2014,05/2013  . BREAST SURGERY Left 01/30/2012   Wide excision, sentinel node biopsy  . BREAST SURGERY Left 02/09/2012   Left breast reduction/reconstruction: Nicholaus Bloom, MD.  . BREAST SURGERY Left December 26, 2012   ultrasound-guided biopsy, post surgical change.  . COLONOSCOPY  2012  . DILATION AND CURETTAGE OF UTERUS  2011  . EXPLORATORY  LAPAROTOMY  2001  . FACIAL COSMETIC SURGERY  2015  . HERNIA REPAIR  1997  . NISSEN FUNDOPLICATION  2023  . OVARY SURGERY Right 2001  . PORT A CATH REVISION  2014  . REDUCTION MAMMAPLASTY Left 2014  . REDUCTION MAMMAPLASTY Right 2015  . RIGHT HEART CATH  2013  . TONSILLECTOMY  1969    FAMILY HISTORY :   Family History  Problem  Relation Age of Onset  . Colon cancer Paternal Grandfather   . Breast cancer Maternal Aunt 21  . Breast cancer Cousin 5  . Breast cancer Cousin 43  . Breast cancer Cousin 108    SOCIAL HISTORY:   Social History   Tobacco Use  . Smoking status: Former Smoker    Packs/day: 0.10    Years: 25.00    Pack years: 2.50    Types: Cigarettes    Last attempt to quit: 01/11/2007    Years since quitting: 10.5  . Smokeless tobacco: Never Used  . Tobacco comment: "social smoker" per pt.  Substance Use Topics  . Alcohol use: No  . Drug use: No    ALLERGIES:  is allergic to augmentin [amoxicillin-pot clavulanate].  MEDICATIONS:  Current Outpatient Medications  Medication Sig Dispense Refill  . acyclovir (ZOVIRAX) 400 MG tablet Take 400 mg by mouth as needed.     . diclofenac (VOLTAREN) 75 MG EC tablet Take 75 mg by mouth 2 (two) times daily.     Marland Kitchen docusate sodium (COLACE) 100 MG capsule Take 100 mg by mouth daily as needed.     Marland Kitchen letrozole (FEMARA) 2.5 MG tablet Take 1 tablet (2.5 mg total) by mouth daily. 30 tablet 3  . montelukast (SINGULAIR) 10 MG tablet TAKE ONE TABLET BY MOUTH AT BEDTIME 30 tablet 0  . venlafaxine (EFFEXOR) 75 MG tablet TAKE ONE TABLET BY MOUTH TWICE DAILY WITH MEALS 60 tablet 3  . phentermine (ADIPEX-P) 37.5 MG tablet      No current facility-administered medications for this visit.     PHYSICAL EXAMINATION: ECOG PERFORMANCE STATUS: 0 - Asymptomatic  BP 112/78 (BP Location: Left Arm, Patient Position: Sitting)   Pulse 71   Temp (!) 97.1 F (36.2 C) (Tympanic)   Resp 16   There were no vitals filed for this visit.  GENERAL: Well-nourished well-developed; Alert, no distress and comfortable.  Alone.  EYES: no pallor or icterus OROPHARYNX: no thrush or ulceration; NECK: supple; no lymph nodes felt. LYMPH:  no palpable lymphadenopathy in the axillary or inguinal regions LUNGS: Decreased breath sounds auscultation bilaterally. No wheeze or crackles HEART/CVS:  regular rate & rhythm and no murmurs; No lower extremity edema ABDOMEN:abdomen soft, non-tender and normal bowel sounds. No hepatomegaly or splenomegaly.  Musculoskeletal:no cyanosis of digits and no clubbing  PSYCH: alert & oriented x 3 with fluent speech NEURO: no focal motor/sensory deficits SKIN:  no rashes or significant lesions Right and left BREAST exam [in the presence of nurse]- no unusual skin changes or dominant masses felt. Surgical scars noted.       LABORATORY DATA:  I have reviewed the data as listed    Component Value Date/Time   NA 138 08/02/2017 1341   NA 140 09/25/2013 1425   K 4.1 08/02/2017 1341   K 4.0 09/25/2013 1425   CL 108 08/02/2017 1341   CL 103 09/25/2013 1425   CO2 22 08/02/2017 1341   CO2 29 09/25/2013 1425   GLUCOSE 100 (H) 08/02/2017 1341   GLUCOSE 92 09/25/2013 1425  BUN 17 08/02/2017 1341   BUN 17 09/25/2013 1425   CREATININE 0.74 08/02/2017 1341   CREATININE 0.87 09/25/2013 1425   CALCIUM 9.8 08/02/2017 1341   CALCIUM 10.1 09/25/2013 1425   PROT 7.0 08/02/2017 1341   PROT 7.2 09/25/2013 1425   ALBUMIN 4.1 08/02/2017 1341   ALBUMIN 3.9 09/25/2013 1425   AST 26 08/02/2017 1341   AST 30 09/25/2013 1425   ALT 31 08/02/2017 1341   ALT 55 09/25/2013 1425   ALKPHOS 124 08/02/2017 1341   ALKPHOS 154 (H) 09/25/2013 1425   BILITOT 0.6 08/02/2017 1341   BILITOT 0.4 09/25/2013 1425   GFRNONAA >60 08/02/2017 1341   GFRNONAA >60 09/25/2013 1425   GFRAA >60 08/02/2017 1341   GFRAA >60 09/25/2013 1425    No results found for: SPEP, UPEP  Lab Results  Component Value Date   WBC 6.4 08/02/2017   NEUTROABS 3.4 08/02/2017   HGB 13.4 08/02/2017   HCT 39.2 08/02/2017   MCV 92.7 08/02/2017   PLT 289 08/02/2017      Chemistry      Component Value Date/Time   NA 138 08/02/2017 1341   NA 140 09/25/2013 1425   K 4.1 08/02/2017 1341   K 4.0 09/25/2013 1425   CL 108 08/02/2017 1341   CL 103 09/25/2013 1425   CO2 22 08/02/2017 1341   CO2  29 09/25/2013 1425   BUN 17 08/02/2017 1341   BUN 17 09/25/2013 1425   CREATININE 0.74 08/02/2017 1341   CREATININE 0.87 09/25/2013 1425      Component Value Date/Time   CALCIUM 9.8 08/02/2017 1341   CALCIUM 10.1 09/25/2013 1425   ALKPHOS 124 08/02/2017 1341   ALKPHOS 154 (H) 09/25/2013 1425   AST 26 08/02/2017 1341   AST 30 09/25/2013 1425   ALT 31 08/02/2017 1341   ALT 55 09/25/2013 1425   BILITOT 0.6 08/02/2017 1341   BILITOT 0.4 09/25/2013 1425       RADIOGRAPHIC STUDIES: I have personally reviewed the radiological images as listed and agreed with the findings in the report. No results found.   ASSESSMENT & PLAN:  Carcinoma of overlapping sites of left breast in female, estrogen receptor positive ([place]) # Stage I ER/PR positive HER-2/neu positive breast cancer currently on adjuvant femara.  Clinically no evidence of recurrence.  Clinically STABLE.  Continue Femara.  # Hot flashes- grade 1; improved continue Effexor. STABLE.  # Fatigue-worse; TSH normal. likley OSA/defer to PCP.    # left breast pain- no masses felt. Chronic neuropathic pain post lumpectomy. Await mammogram. STABLE.   # Osteopenia- 2017 June BMD; continue ca+vitD. STABLE.   # follow up in 6 months. Labs/ ca-27-29.   Cc; Dr. Glendon Axe.    Orders Placed This Encounter  Procedures  . CBC with Differential/Platelet    Standing Status:   Future    Standing Expiration Date:   08/03/2018  . Comprehensive metabolic panel    Standing Status:   Future    Standing Expiration Date:   08/03/2018  . Cancer antigen 27.29    Standing Status:   Future    Standing Expiration Date:   08/03/2018   All questions were answered. The patient knows to call the clinic with any problems, questions or concerns.      Cammie Sickle, MD 08/05/2017 10:42 AM

## 2017-08-03 LAB — CANCER ANTIGEN 27.29: CA 27.29: 12.3 U/mL (ref 0.0–38.6)

## 2017-08-23 ENCOUNTER — Ambulatory Visit
Admission: RE | Admit: 2017-08-23 | Discharge: 2017-08-23 | Disposition: A | Discharge status: Home / Self Care | Payer: Federal, State, Local not specified - PPO | Source: Ambulatory Visit | Attending: General Surgery | Admitting: General Surgery

## 2017-08-23 DIAGNOSIS — Z170 Estrogen receptor positive status [ER+]: Secondary | ICD-10-CM | POA: Diagnosis present

## 2017-08-23 DIAGNOSIS — C50812 Malignant neoplasm of overlapping sites of left female breast: Secondary | ICD-10-CM | POA: Diagnosis present

## 2017-08-24 ENCOUNTER — Other Ambulatory Visit: Payer: Self-pay

## 2017-08-24 ENCOUNTER — Ambulatory Visit: Payer: Federal, State, Local not specified - PPO

## 2017-08-29 ENCOUNTER — Encounter: Payer: Self-pay | Admitting: General Surgery

## 2017-08-29 ENCOUNTER — Ambulatory Visit: Payer: Federal, State, Local not specified - PPO | Admitting: General Surgery

## 2017-08-29 VITALS — BP 132/68 | HR 84 | Resp 13 | Ht 68.0 in | Wt 197.0 lb

## 2017-08-29 DIAGNOSIS — C50812 Malignant neoplasm of overlapping sites of left female breast: Secondary | ICD-10-CM | POA: Diagnosis not present

## 2017-08-29 DIAGNOSIS — Z170 Estrogen receptor positive status [ER+]: Secondary | ICD-10-CM | POA: Diagnosis not present

## 2017-08-29 NOTE — Patient Instructions (Addendum)
The patient is aware to call back for any questions or new concerns. Patient will be asked to return to the office in one year with a bilateral screening mammogram.  

## 2017-08-29 NOTE — Progress Notes (Signed)
Patient ID: Katrina Holland, female   DOB: 03/09/1960, 57 y.o.   MRN: [id number]  Chief Complaint  Patient presents with  . Follow-up    HPI Katrina Holland is a 57 y.o. female who presents for a breast evaluation. The most recent mammogram was done on 08/23/2017. Marland Kitchen  Patient does perform regular self breast checks and gets regular mammograms done.  No new breast issues.   Past Medical History:  Diagnosis Date  . Anxiety   . Asthma   . Breast CA ([place])   . Breast cancer ([place]) 2014   left breast  . Endometriosis 2001  . GERD (gastroesophageal reflux disease)   . Irritable bowel syndrome   . Malignant neoplasm of upper-outer quadrant of female breast ([place]) 01/30/2012   Left breast, Invasive mammary cancer, no special type: T1c,N0,M0; ER 90%, PR 90%, no amplification of HER-2/neu, aromatase inhibitors discontinued September 2015 secondary to joint symptoms, tamoxifen initiated by Dr. Ma Hillock..  . Personal history of chemotherapy   . Personal history of radiation therapy   . Pulmonary hypertension ([place]) 2013  . Sleep apnea 2007  . Sleep apnea 2007    Past Surgical History:  Procedure Laterality Date  . BREAST BIOPSY Left 2013   neg,   . BREAST BIOPSY Left 01/14   invasive ductal carcinoma  . BREAST BIOPSY Left 12/14   benign, done at Dr. Dwyane Luo office, [place]  . BREAST ENHANCEMENT SURGERY  2014,05/2013  . BREAST LUMPECTOMY Left 2014   invasive ductal carcinoma  . BREAST SURGERY Left 01/30/2012   Wide excision, sentinel node biopsy  . BREAST SURGERY Left 02/09/2012   Left breast reduction/reconstruction: Nicholaus Bloom, MD.  . BREAST SURGERY Left December 26, 2012   ultrasound-guided biopsy, post surgical change.  . COLONOSCOPY  2012  . DILATION AND CURETTAGE OF UTERUS  2011  . EXPLORATORY LAPAROTOMY  2001  . FACIAL COSMETIC SURGERY  2015  . HERNIA REPAIR  1997  . NISSEN FUNDOPLICATION  0258  . OVARY SURGERY Right 2001  . PORT A CATH REVISION  2014  . REDUCTION MAMMAPLASTY  Left 2014  . REDUCTION MAMMAPLASTY Right 2015  . RIGHT HEART CATH  2013  . TONSILLECTOMY  1969    Family History  Problem Relation Age of Onset  . Colon cancer Paternal Grandfather   . Breast cancer Maternal Aunt 29  . Breast cancer Cousin 33  . Breast cancer Cousin 67  . Breast cancer Cousin 81    Social History Social History   Tobacco Use  . Smoking status: Former Smoker    Packs/day: 0.10    Years: 25.00    Pack years: 2.50    Types: Cigarettes    Last attempt to quit: 01/11/2007    Years since quitting: 10.6  . Smokeless tobacco: Never Used  . Tobacco comment: "social smoker" per pt.  Substance Use Topics  . Alcohol use: No  . Drug use: No    Allergies  Allergen Reactions  . Augmentin [Amoxicillin-Pot Clavulanate]     "makes me sick"    Current Outpatient Medications  Medication Sig Dispense Refill  . diclofenac (VOLTAREN) 75 MG EC tablet Take 75 mg by mouth 2 (two) times daily.     Marland Kitchen docusate sodium (COLACE) 100 MG capsule Take 100 mg by mouth daily as needed.     Marland Kitchen letrozole (FEMARA) 2.5 MG tablet Take 1 tablet (2.5 mg total) by mouth daily. 30 tablet 3  . montelukast (SINGULAIR) 10 MG tablet  TAKE ONE TABLET BY MOUTH AT BEDTIME 30 tablet 0  . venlafaxine (EFFEXOR) 75 MG tablet TAKE ONE TABLET BY MOUTH TWICE DAILY WITH MEALS 60 tablet 3   No current facility-administered medications for this visit.     Review of Systems Review of Systems  Blood pressure 132/68, pulse 84, resp. rate 13, height '5\' 8"'  (1.727 m), weight 197 lb (89.4 kg).  Physical Exam Physical Exam  Constitutional: She is oriented to person, place, and time. She appears well-developed and well-nourished.  HENT:  Mouth/Throat: Oropharynx is clear and moist.  Eyes: Conjunctivae are normal. No scleral icterus.  Neck: Neck supple.  Cardiovascular: Normal rate, regular rhythm and normal heart sounds.  Pulmonary/Chest: Effort normal and breath sounds normal. Right breast exhibits no inverted  nipple, no mass, no nipple discharge, no skin change and no tenderness. Left breast exhibits no inverted nipple, no mass, no nipple discharge, no skin change and no tenderness.  Left lumpectomy scar clean. Right breast reconstruction scar clean.    Lymphadenopathy:    She has no cervical adenopathy.    She has no axillary adenopathy.  Neurological: She is alert and oriented to person, place, and time.  Skin: Skin is warm and dry.  Psychiatric: Her behavior is normal.    Data Reviewed Bilateral diagnostic mammograms dated August 23, 2017 were reviewed.  Postsurgical change.  Measurements of the upper extremities were obtained in the location 15 cm above as well as 10 and 20 cm below the olecranon process.  Right: 38, 27.5, 21 cm.  Left: 39, 28, 21 cm.  Mildly increased bilaterally consistent with patient's 16 pound weight gain over the last year.  August 02, 2017 medical oncology notes reviewed.    Assessment    No evidence of recurrent tumor.    Plan    Patient will be asked to return to the office in one year with a bilateral screening mammogram.      HPI, Physical Exam, Assessment and Plan have been scribed under the direction and in the presence of Robert Bellow, MD. Karie Fetch, RN  I have completed the exam and reviewed the above documentation for accuracy and completeness.  I agree with the above.  Haematologist has been used and any errors in dictation or transcription are unintentional.  Hervey Ard, M.D., F.A.C.S.   Katrina Holland 08/30/2017, 5:32 PM

## 2017-08-31 ENCOUNTER — Ambulatory Visit: Payer: Federal, State, Local not specified - PPO | Admitting: General Surgery

## 2017-09-08 ENCOUNTER — Other Ambulatory Visit: Payer: Self-pay | Admitting: Internal Medicine

## 2017-09-08 ENCOUNTER — Other Ambulatory Visit: Payer: Self-pay | Admitting: Gastroenterology

## 2017-09-08 DIAGNOSIS — R748 Abnormal levels of other serum enzymes: Secondary | ICD-10-CM

## 2017-09-13 ENCOUNTER — Ambulatory Visit
Admission: RE | Admit: 2017-09-13 | Discharge: 2017-09-13 | Disposition: A | Discharge status: Home / Self Care | Payer: Federal, State, Local not specified - PPO | Source: Ambulatory Visit | Attending: Internal Medicine | Admitting: Internal Medicine

## 2017-09-13 ENCOUNTER — Encounter (INDEPENDENT_AMBULATORY_CARE_PROVIDER_SITE_OTHER): Payer: Self-pay

## 2017-09-13 DIAGNOSIS — R748 Abnormal levels of other serum enzymes: Secondary | ICD-10-CM | POA: Diagnosis present

## 2017-10-30 ENCOUNTER — Other Ambulatory Visit: Payer: Self-pay | Admitting: Internal Medicine

## 2017-11-15 ENCOUNTER — Other Ambulatory Visit: Payer: Self-pay | Admitting: Gastroenterology

## 2017-11-15 DIAGNOSIS — R1011 Right upper quadrant pain: Secondary | ICD-10-CM

## 2017-12-04 ENCOUNTER — Encounter (HOSPITAL_COMMUNITY)
Admission: RE | Admit: 2017-12-04 | Discharge: 2017-12-04 | Disposition: A | Discharge status: Home / Self Care | Payer: Federal, State, Local not specified - PPO | Source: Ambulatory Visit | Attending: Gastroenterology | Admitting: Gastroenterology

## 2017-12-04 DIAGNOSIS — R1011 Right upper quadrant pain: Secondary | ICD-10-CM | POA: Insufficient documentation

## 2017-12-04 MED ORDER — TECHNETIUM TC 99M MEBROFENIN IV KIT
5.4000 | PACK | Freq: Once | INTRAVENOUS | Status: AC | PRN
  Administered 2017-12-04: 5.4 via INTRAVENOUS

## 2018-01-31 ENCOUNTER — Other Ambulatory Visit: Payer: Self-pay

## 2018-01-31 ENCOUNTER — Inpatient Hospital Stay: Payer: Federal, State, Local not specified - PPO | Attending: Internal Medicine

## 2018-01-31 ENCOUNTER — Encounter (INDEPENDENT_AMBULATORY_CARE_PROVIDER_SITE_OTHER): Payer: Self-pay

## 2018-01-31 ENCOUNTER — Inpatient Hospital Stay (HOSPITAL_BASED_OUTPATIENT_CLINIC_OR_DEPARTMENT_OTHER): Payer: Federal, State, Local not specified - PPO | Admitting: Internal Medicine

## 2018-01-31 ENCOUNTER — Encounter: Payer: Self-pay | Admitting: Internal Medicine

## 2018-01-31 VITALS — BP 126/84 | HR 75 | Temp 97.6°F | Resp 18 | Ht 68.0 in | Wt 201.0 lb

## 2018-01-31 DIAGNOSIS — Z79811 Long term (current) use of aromatase inhibitors: Secondary | ICD-10-CM | POA: Diagnosis not present

## 2018-01-31 DIAGNOSIS — N951 Menopausal and female climacteric states: Secondary | ICD-10-CM | POA: Diagnosis not present

## 2018-01-31 DIAGNOSIS — C50812 Malignant neoplasm of overlapping sites of left female breast: Secondary | ICD-10-CM | POA: Diagnosis not present

## 2018-01-31 DIAGNOSIS — Z79899 Other long term (current) drug therapy: Secondary | ICD-10-CM

## 2018-01-31 DIAGNOSIS — M8580 Other specified disorders of bone density and structure, unspecified site: Secondary | ICD-10-CM | POA: Insufficient documentation

## 2018-01-31 DIAGNOSIS — Z170 Estrogen receptor positive status [ER+]: Secondary | ICD-10-CM | POA: Diagnosis not present

## 2018-01-31 LAB — COMPREHENSIVE METABOLIC PANEL
ALT: 33 U/L (ref 0–44)
AST: 24 U/L (ref 15–41)
Albumin: 4.1 g/dL (ref 3.5–5.0)
Alkaline Phosphatase: 129 U/L — ABNORMAL HIGH (ref 38–126)
Anion gap: 8 (ref 5–15)
BUN: 13 mg/dL (ref 6–20)
CO2: 25 mmol/L (ref 22–32)
Calcium: 9.4 mg/dL (ref 8.9–10.3)
Chloride: 107 mmol/L (ref 98–111)
Creatinine, Ser: 0.67 mg/dL (ref 0.44–1.00)
GFR calc Af Amer: >60 mL/min (ref >60)
GFR calc non Af Amer: >60 mL/min (ref >60)
Glucose, Bld: 100 mg/dL — ABNORMAL HIGH (ref 70–99)
Potassium: 3.9 mmol/L (ref 3.5–5.1)
Sodium: 140 mmol/L (ref 135–145)
Total Bilirubin: 0.6 mg/dL (ref 0.3–1.2)
Total Protein: 7 g/dL (ref 6.5–8.1)

## 2018-01-31 LAB — CBC WITH DIFFERENTIAL/PLATELET
Abs Immature Granulocytes: 0.01 10*3/uL (ref 0.00–0.07)
Basophils Absolute: 0 10*3/uL (ref 0.0–0.1)
Basophils Relative: 0 %
Eosinophils Absolute: 0 10*3/uL (ref 0.0–0.5)
Eosinophils Relative: 1 %
HCT: 40.1 % (ref 36.0–46.0)
Hemoglobin: 13.3 g/dL (ref 12.0–15.0)
Immature Granulocytes: 0 %
Lymphocytes Relative: 35 %
Lymphs Abs: 2.1 10*3/uL (ref 0.7–4.0)
MCH: 30.6 pg (ref 26.0–34.0)
MCHC: 33.2 g/dL (ref 30.0–36.0)
MCV: 92.2 fL (ref 80.0–100.0)
Monocytes Absolute: 0.4 10*3/uL (ref 0.1–1.0)
Monocytes Relative: 6 %
Neutro Abs: 3.4 10*3/uL (ref 1.7–7.7)
Neutrophils Relative %: 58 %
Platelets: 292 10*3/uL (ref 150–400)
RBC: 4.35 MIL/uL (ref 3.87–5.11)
RDW: 11.9 % (ref 11.5–15.5)
WBC: 5.9 10*3/uL (ref 4.0–10.5)
nRBC: 0 % (ref 0.0–0.2)

## 2018-01-31 NOTE — Progress Notes (Signed)
[facility] OFFICE PROGRESS NOTE  Patient Care Team: Glendon Axe, MD as PCP - General (Internal Medicine) Bary Castilla Forest Gleason, MD (General Surgery) Dear, [name], MD (Inactive) (Family Medicine)  Cancer Staging No matching staging information was found for the patient.   Oncology History   # JAN 2014- LEFT BREAST CA IDC; pT1c (1.2cm) pN0 [Stage I] ER/PR > 90%; her 2 Neu POS; AC x4- Taxol-Herceptin; Intol to AI; March 2015- Started TAM [stopped- Sep 2017]; OCT 2017- Post menopausal [estradiol/FSH]; NOV 1st 2017- Start ARIMIDEX  # DEC 2016- Start Effexor   # Right oopherectomy; Left intact ovary-intact [last periods late 90s]  DIAGNOSIS:Breast cancer  STAGE:  I       ;GOALS: cure  CURRENT/MOST RECENT THERAPY: anastrazole      Malignant neoplasm of overlapping sites of left breast in female, estrogen receptor positive ([place])      INTERVAL HISTORY:  Katrina Holland 58 y.o.  female pleasant patient above history of stage I breast cancer ER PR positive currently on adjuvant Arimidex is here for follow-up.  Patient denies any bone pain.  Continues to have chronic joint pains hip pain.  Not any worse.  She denies any headaches.  Denies any significant hot flashes.  Continues to take Effexor for her flashes.  Review of Systems  Constitutional: Positive for malaise/fatigue. Negative for chills, diaphoresis, fever and weight loss.  HENT: Negative for nosebleeds and sore throat.   Eyes: Negative for double vision.  Respiratory: Negative for cough, hemoptysis, sputum production, shortness of breath and wheezing.   Cardiovascular: Negative for palpitations, orthopnea and leg swelling.  Gastrointestinal: Negative for abdominal pain, blood in stool, constipation, diarrhea, heartburn, melena, nausea and vomiting.  Genitourinary: Negative for dysuria, frequency and urgency.  Musculoskeletal: Negative for back pain and joint pain.  Skin: Negative.  Negative for itching and  rash.  Neurological: Negative for dizziness, tingling, focal weakness, weakness and headaches.  Endo/Heme/Allergies: Does not bruise/bleed easily.  Psychiatric/Behavioral: Negative for depression. The patient is not nervous/anxious and does not have insomnia.       PAST MEDICAL HISTORY :  Past Medical History:  Diagnosis Date  . Anxiety   . Asthma   . Breast CA ([place])   . Breast cancer ([place]) 2014   left breast  . Endometriosis 2001  . GERD (gastroesophageal reflux disease)   . Irritable bowel syndrome   . Malignant neoplasm of upper-outer quadrant of female breast ([place]) 01/30/2012   Left breast, Invasive mammary cancer, no special type: T1c,N0,M0; ER 90%, PR 90%, no amplification of HER-2/neu, aromatase inhibitors discontinued September 2015 secondary to joint symptoms, tamoxifen initiated by Dr. Ma Hillock..  . Personal history of chemotherapy   . Personal history of radiation therapy   . Pulmonary hypertension ([place]) 2013  . Sleep apnea 2007  . Sleep apnea 2007    PAST SURGICAL HISTORY :   Past Surgical History:  Procedure Laterality Date  . BREAST BIOPSY Left 2013   neg,   . BREAST BIOPSY Left 01/14   invasive ductal carcinoma  . BREAST BIOPSY Left 12/14   benign, done at Dr. Dwyane Luo office, [place]  . BREAST ENHANCEMENT SURGERY  2014,05/2013  . BREAST LUMPECTOMY Left 2014   invasive ductal carcinoma  . BREAST SURGERY Left 01/30/2012   Wide excision, sentinel node biopsy  . BREAST SURGERY Left 02/09/2012   Left breast reduction/reconstruction: Nicholaus Bloom, MD.  . BREAST SURGERY Left December 26, 2012   ultrasound-guided biopsy, post surgical change.  Marland Kitchen  COLONOSCOPY  2012  . DILATION AND CURETTAGE OF UTERUS  2011  . EXPLORATORY LAPAROTOMY  2001  . FACIAL COSMETIC SURGERY  2015  . HERNIA REPAIR  1997  . NISSEN FUNDOPLICATION  5631  . OVARY SURGERY Right 2001  . PORT A CATH REVISION  2014  . REDUCTION MAMMAPLASTY Left 2014  . REDUCTION MAMMAPLASTY Right 2015  . RIGHT  HEART CATH  2013  . TONSILLECTOMY  1969    FAMILY HISTORY :   Family History  Problem Relation Age of Onset  . Colon cancer Paternal Grandfather   . Breast cancer Maternal Aunt 4  . Breast cancer Cousin 22  . Breast cancer Cousin 42  . Breast cancer Cousin 64    SOCIAL HISTORY:   Social History   Tobacco Use  . Smoking status: Former Smoker    Packs/day: 0.10    Years: 25.00    Pack years: 2.50    Types: Cigarettes    Last attempt to quit: 01/11/2007    Years since quitting: 11.1  . Smokeless tobacco: Never Used  . Tobacco comment: "social smoker" per pt.  Substance Use Topics  . Alcohol use: No  . Drug use: No    ALLERGIES:  is allergic to augmentin [amoxicillin-pot clavulanate].  MEDICATIONS:  Current Outpatient Medications  Medication Sig Dispense Refill  . diclofenac (VOLTAREN) 75 MG EC tablet Take 75 mg by mouth 2 (two) times daily.     . montelukast (SINGULAIR) 10 MG tablet TAKE ONE TABLET BY MOUTH AT BEDTIME 30 tablet 0  . venlafaxine (EFFEXOR) 75 MG tablet TAKE ONE TABLET BY MOUTH TWICE DAILY WITH MEALS 60 tablet 3  . letrozole (FEMARA) 2.5 MG tablet Take 1 tablet (2.5 mg total) by mouth daily. 90 tablet 1  . pravastatin (PRAVACHOL) 10 MG tablet Take 10 mg by mouth at bedtime.     No current facility-administered medications for this visit.     PHYSICAL EXAMINATION: ECOG PERFORMANCE STATUS: 0 - Asymptomatic  BP 126/84   Pulse 75   Temp 97.6 F (36.4 C) (Tympanic)   Resp 18   Ht 5' 8" (1.727 m)   Wt 201 lb (91.2 kg)   BMI 30.56 kg/m   Filed Weights   01/31/18 1408  Weight: 201 lb (91.2 kg)    Physical Exam  Constitutional: She is oriented to person, place, and time and well-developed, well-nourished, and in no distress.  HENT:  Head: Normocephalic and atraumatic.  Mouth/Throat: Oropharynx is clear and moist. No oropharyngeal exudate.  Eyes: Pupils are equal, round, and reactive to light.  Neck: Normal range of motion. Neck supple.   Cardiovascular: Normal rate and regular rhythm.  Pulmonary/Chest: No respiratory distress. She has no wheezes.  Abdominal: Soft. Bowel sounds are normal. She exhibits no distension and no mass. There is no abdominal tenderness. There is no rebound and no guarding.  Musculoskeletal: Normal range of motion.        General: No tenderness or edema.  Neurological: She is alert and oriented to person, place, and time.  Skin: Skin is warm.  Right and left BREAST exam (in the presence of nurse)- no unusual skin changes or dominant masses felt. Surgical scars noted.    Psychiatric: Affect normal.         LABORATORY DATA:  I have reviewed the data as listed    Component Value Date/Time   NA 140 01/31/2018 1348   NA 140 09/25/2013 1425   K 3.9 01/31/2018 1348  K 4.0 09/25/2013 1425   CL 107 01/31/2018 1348   CL 103 09/25/2013 1425   CO2 25 01/31/2018 1348   CO2 29 09/25/2013 1425   GLUCOSE 100 (H) 01/31/2018 1348   GLUCOSE 92 09/25/2013 1425   BUN 13 01/31/2018 1348   BUN 17 09/25/2013 1425   CREATININE 0.67 01/31/2018 1348   CREATININE 0.87 09/25/2013 1425   CALCIUM 9.4 01/31/2018 1348   CALCIUM 10.1 09/25/2013 1425   PROT 7.0 01/31/2018 1348   PROT 7.2 09/25/2013 1425   ALBUMIN 4.1 01/31/2018 1348   ALBUMIN 3.9 09/25/2013 1425   AST 24 01/31/2018 1348   AST 30 09/25/2013 1425   ALT 33 01/31/2018 1348   ALT 55 09/25/2013 1425   ALKPHOS 129 (H) 01/31/2018 1348   ALKPHOS 154 (H) 09/25/2013 1425   BILITOT 0.6 01/31/2018 1348   BILITOT 0.4 09/25/2013 1425   GFRNONAA >60 01/31/2018 1348   GFRNONAA >60 09/25/2013 1425   GFRAA >60 01/31/2018 1348   GFRAA >60 09/25/2013 1425    No results found for: SPEP, UPEP  Lab Results  Component Value Date   WBC 5.9 01/31/2018   NEUTROABS 3.4 01/31/2018   HGB 13.3 01/31/2018   HCT 40.1 01/31/2018   MCV 92.2 01/31/2018   PLT 292 01/31/2018      Chemistry      Component Value Date/Time   NA 140 01/31/2018 1348   NA 140  09/25/2013 1425   K 3.9 01/31/2018 1348   K 4.0 09/25/2013 1425   CL 107 01/31/2018 1348   CL 103 09/25/2013 1425   CO2 25 01/31/2018 1348   CO2 29 09/25/2013 1425   BUN 13 01/31/2018 1348   BUN 17 09/25/2013 1425   CREATININE 0.67 01/31/2018 1348   CREATININE 0.87 09/25/2013 1425      Component Value Date/Time   CALCIUM 9.4 01/31/2018 1348   CALCIUM 10.1 09/25/2013 1425   ALKPHOS 129 (H) 01/31/2018 1348   ALKPHOS 154 (H) 09/25/2013 1425   AST 24 01/31/2018 1348   AST 30 09/25/2013 1425   ALT 33 01/31/2018 1348   ALT 55 09/25/2013 1425   BILITOT 0.6 01/31/2018 1348   BILITOT 0.4 09/25/2013 1425       RADIOGRAPHIC STUDIES: I have personally reviewed the radiological images as listed and agreed with the findings in the report. No results found.   ASSESSMENT & PLAN:  Malignant neoplasm of overlapping sites of left breast in female, estrogen receptor positive ([place]) # Stage I ER/PR positive HER-2/neu positive breast cancer currently on adjuvant femara.  No clinical evidence of recurrence.  Stable.  # Continue Femara.  # Hot flashes- grade 1; on Effexor stable.  # Osteopenia- 2019- BMD; continue ca+vitD. Discussed the potential risk factors for osteoporosis- age/gender/postmenopausal status/use of anti-estrogen treatments. Discussed multiple options including exercise/ calcium and vitamin D supplementation/ and also use of bisphosphonates. Discussed the potential benefits and/side effects  Including but not limited to Osteonecrosis of jaw/ hypocalcemia.  Discussed regarding the small adjuvant benefit of Zometa 4 mg every 6 months.  Patient is interested.  #Intermittent increasing alkaline phosphatase-patient has no worsening bone pain [chronic arthritic pain]-regarding the bone scan to rule out metastatic disease.  Clinically less likely.  Patient will call us if worse would recommend a bone scan.  #Chronic fatigue unclear etiology/sleep study negative as per patient.  [facility] care  program.  # DISPOSITION: Zometa in 1 week/ no labs.  # follow up in 6 months. Labs-[email or web address]/ [id number].  Cc; Dr. Glendon Axe.    Orders Placed This Encounter  Procedures  . AMB REFERRAL TO [facility] CARE    Referral Priority:   Routine    Referral Type:   Consultation    Number of Visits Requested:   1   All questions were answered. The patient knows to call the clinic with any problems, questions or concerns.      Cammie Sickle, MD 03/04/2018 6:29 PM

## 2018-01-31 NOTE — Assessment & Plan Note (Addendum)
#  Stage I ER/PR positive HER-2/neu positive breast cancer currently on adjuvant femara.  No clinical evidence of recurrence.  Stable.  # Continue Femara.  # Hot flashes- grade 1; on Effexor stable.  # Osteopenia- 2019- BMD; continue ca+vitD. Discussed the potential risk factors for osteoporosis- age/gender/postmenopausal status/use of anti-estrogen treatments. Discussed multiple options including exercise/ calcium and vitamin D supplementation/ and also use of bisphosphonates. Discussed the potential benefits and/side effects  Including but not limited to Osteonecrosis of jaw/ hypocalcemia.  Discussed regarding the small adjuvant benefit of Zometa 4 mg every 6 months.  Patient is interested.  #Intermittent increasing alkaline phosphatase-patient has no worsening bone pain [chronic arthritic pain]-regarding the bone scan to rule out metastatic disease.  Clinically less likely.  Patient will call us if worse would recommend a bone scan.  #Chronic fatigue unclear etiology/sleep study negative as per patient.  [facility] care program.  # DISPOSITION: Zometa in 1 week/ no labs.  # follow up in 6 months. Labs-cbc.cmp/ [id number].B.   Cc; Dr. Glendon Axe.

## 2018-02-01 LAB — CANCER ANTIGEN 27.29: CA 27.29: 10.2 U/mL (ref 0.0–38.6)

## 2018-02-05 ENCOUNTER — Other Ambulatory Visit: Payer: Self-pay

## 2018-02-05 MED ORDER — LETROZOLE 2.5 MG PO TABS: 2.5000 mg | ORAL_TABLET | Freq: Every day | ORAL | 1 refills | Status: DC

## 2018-02-07 ENCOUNTER — Inpatient Hospital Stay: Payer: Federal, State, Local not specified - PPO

## 2018-02-07 VITALS — BP 106/72 | HR 79 | Temp 97.0°F | Resp 20

## 2018-02-07 DIAGNOSIS — C50812 Malignant neoplasm of overlapping sites of left female breast: Secondary | ICD-10-CM

## 2018-02-07 DIAGNOSIS — Z170 Estrogen receptor positive status [ER+]: Principal | ICD-10-CM

## 2018-02-07 MED ORDER — ZOLEDRONIC ACID 4 MG/100ML IV SOLN
4.0000 mg | Freq: Once | INTRAVENOUS | Status: AC
  Administered 2018-02-07: 4 mg via INTRAVENOUS
  Filled 2018-02-07: qty 100

## 2018-02-07 MED ORDER — SODIUM CHLORIDE 0.9 % IV SOLN
Freq: Once | INTRAVENOUS | Status: AC
  Administered 2018-02-07: 14:00:00 via INTRAVENOUS
  Filled 2018-02-07: qty 250

## 2018-04-11 ENCOUNTER — Other Ambulatory Visit: Payer: Self-pay | Admitting: Internal Medicine

## 2018-04-11 DIAGNOSIS — R0602 Shortness of breath: Secondary | ICD-10-CM

## 2018-05-07 IMAGING — MG MM DIGITAL DIAGNOSTIC BILAT W/ TOMO W/ CAD
8 of 18 series · 8 of 38 positions shown · non-contrast
Comparison: Previous exam(s).

CLINICAL DATA: 56-year-old female with history of left lumpectomy
in 0326.

EXAM:
2D DIGITAL DIAGNOSTIC BILATERAL MAMMOGRAM WITH CAD AND ADJUNCT TOMO

[L MLO]
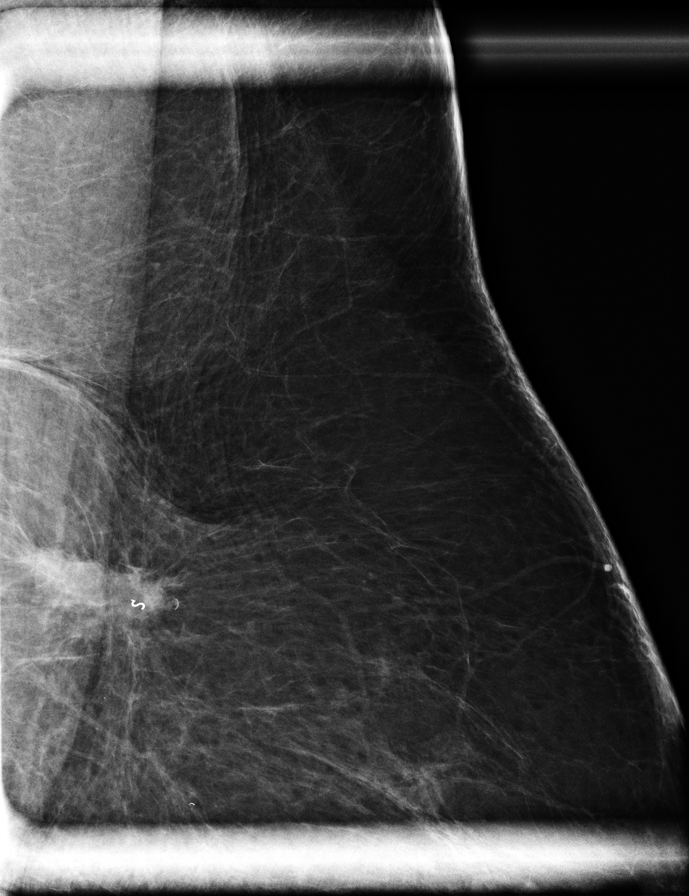

[L ML]
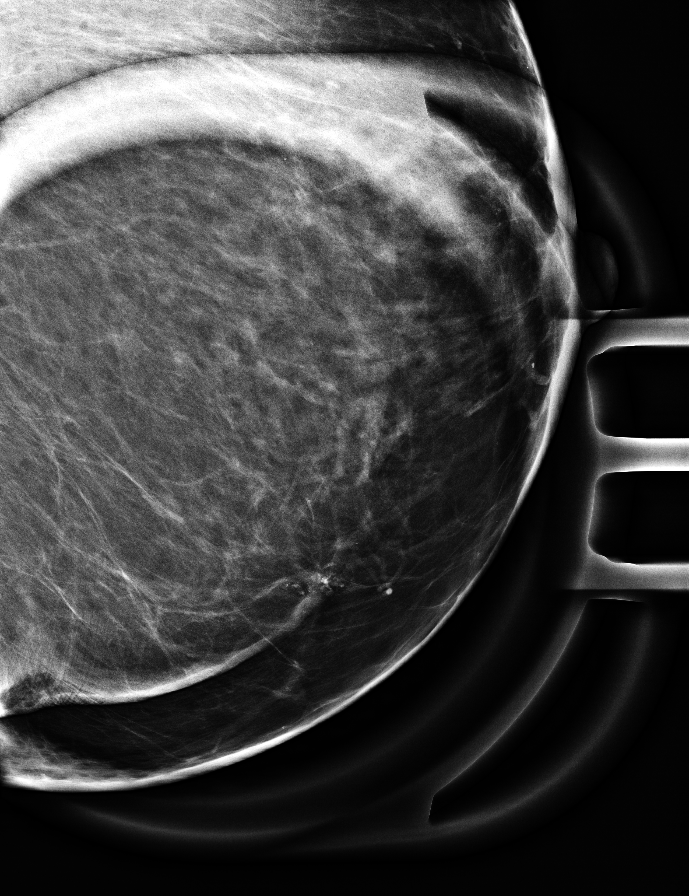

[L CC (1 of 3)]
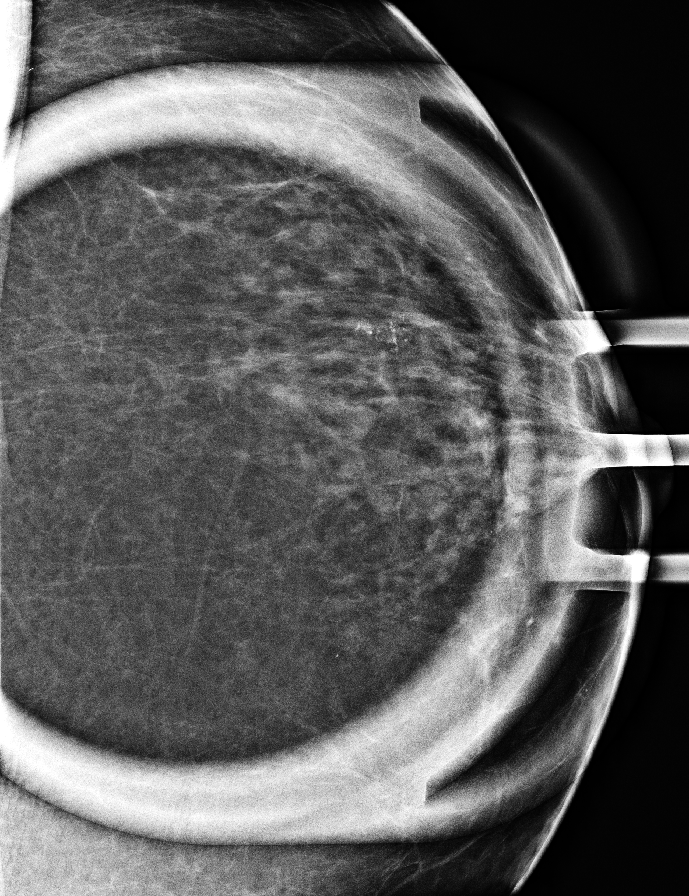

[L CC synth-2D]
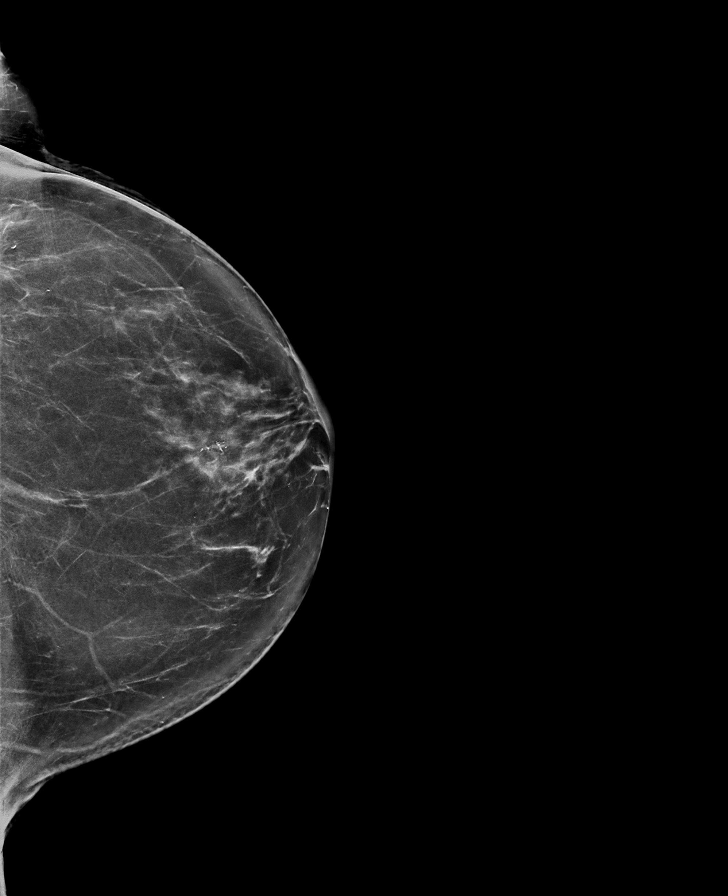

[L CC (2 of 3)]
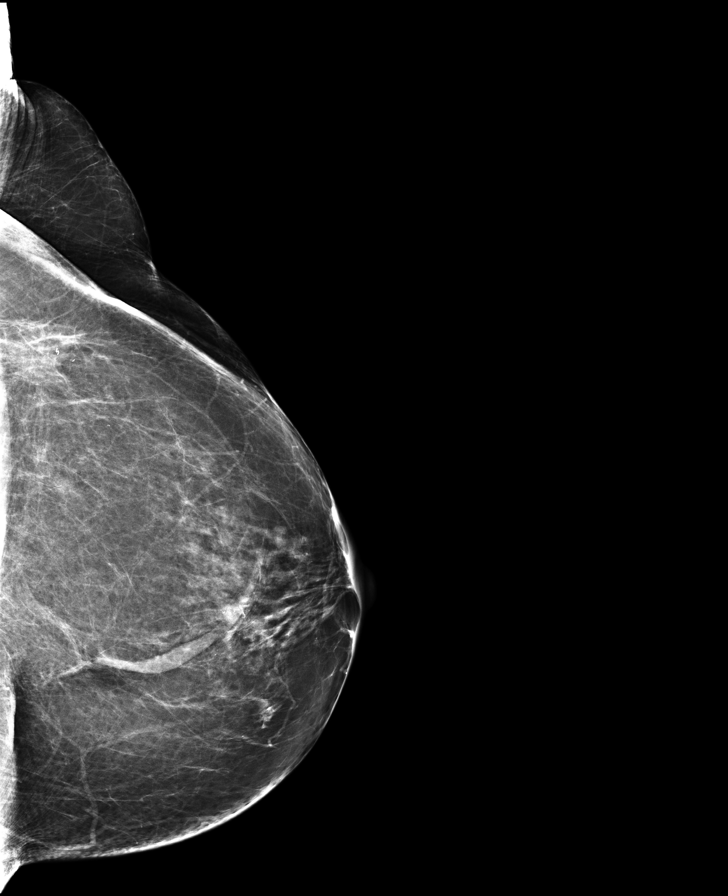

[L CC (3 of 3)]
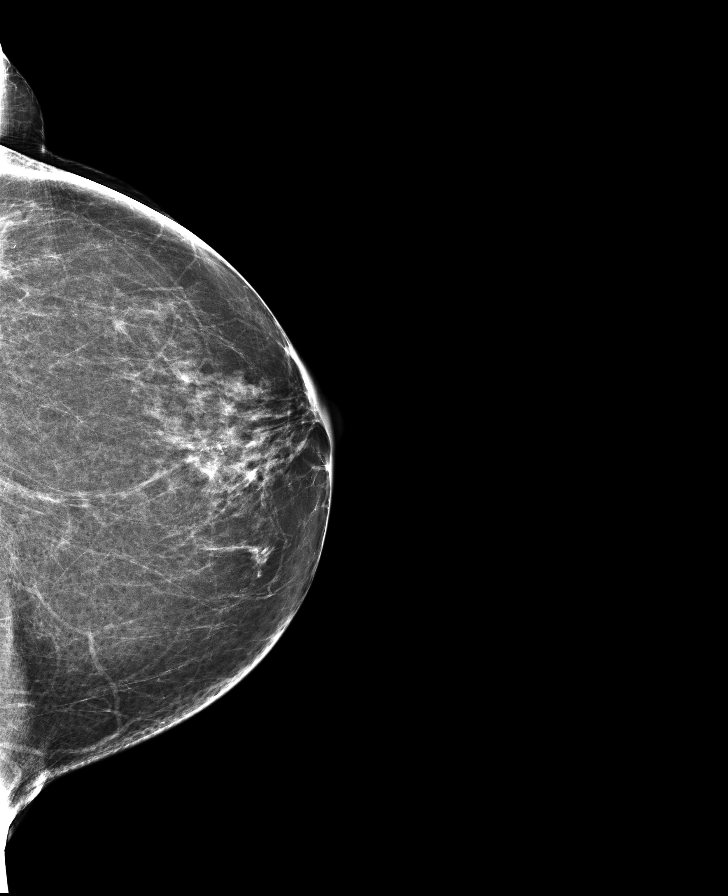

[R CC]
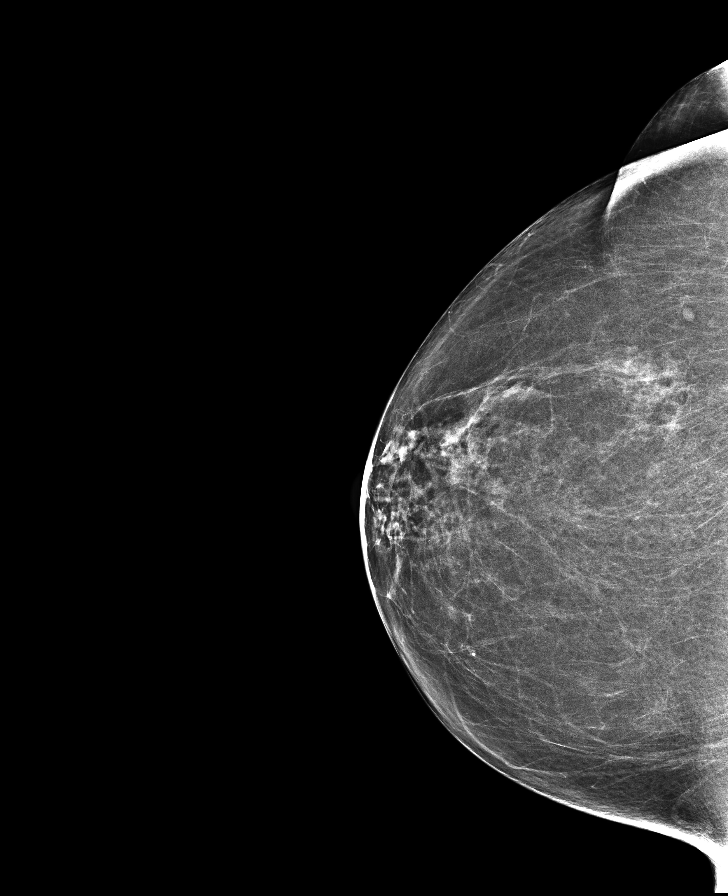

[R MLO]
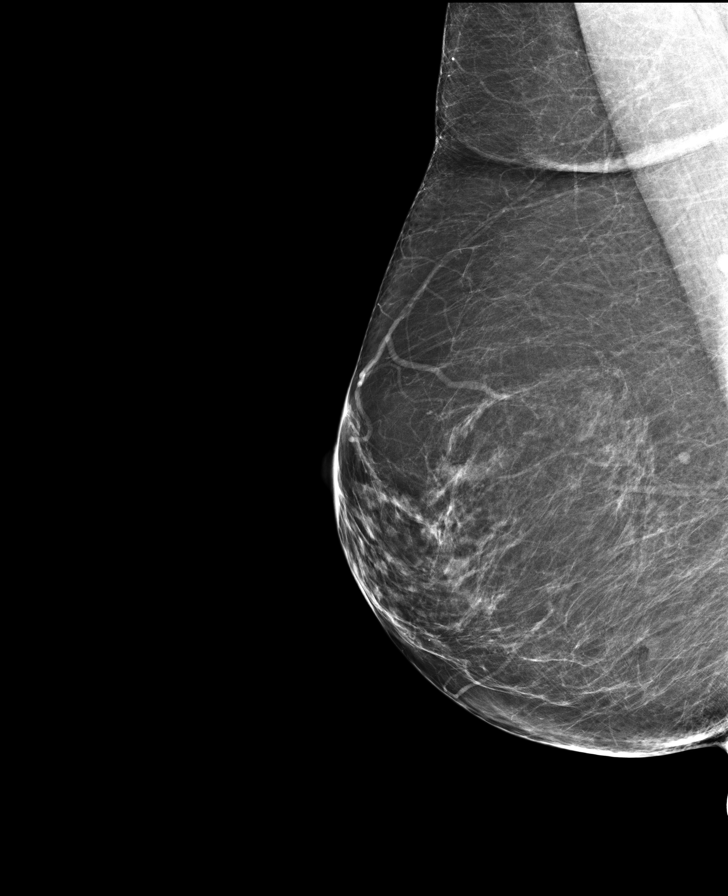

[8 of 38 positions shown; findings below may reference images not displayed]

ACR Breast Density Category b: There are scattered areas of
fibroglandular density.
FINDINGS: Postlumpectomy changes are again seen of the upper, outer left
breast. Changes of prior left excisional biopsy are also noted.
There are new dystrophic appearing calcifications surrounding fat
density within the lower, slightly inner left breast. No suspicious
mass, calcifications, or other abnormality is identified within the
right breast.

Mammographic images were processed with CAD.
IMPRESSION: Probably benign left breast calcifications.

RECOMMENDATION:
Left diagnostic mammogram in 6 months.

I have discussed the findings and recommendations with the patient.
Results were also provided in writing at the conclusion of the
visit. If applicable, a reminder letter will be sent to the patient
regarding the next appointment.

BI-RADS CATEGORY  3: Probably benign.

## 2018-05-09 ENCOUNTER — Other Ambulatory Visit: Payer: Self-pay

## 2018-05-09 ENCOUNTER — Ambulatory Visit
Admission: RE | Admit: 2018-05-09 | Discharge: 2018-05-09 | Disposition: A | Discharge status: Home / Self Care | Payer: Federal, State, Local not specified - PPO | Source: Ambulatory Visit | Attending: Internal Medicine | Admitting: Internal Medicine

## 2018-05-09 DIAGNOSIS — R0602 Shortness of breath: Secondary | ICD-10-CM

## 2018-05-09 MED ORDER — IOHEXOL 300 MG/ML  SOLN
75.0000 mL | Freq: Once | INTRAMUSCULAR | Status: AC | PRN
  Administered 2018-05-09: 75 mL via INTRAVENOUS

## 2018-07-08 ENCOUNTER — Other Ambulatory Visit: Payer: Self-pay

## 2018-07-08 ENCOUNTER — Encounter: Payer: Self-pay | Admitting: Emergency Medicine

## 2018-07-08 ENCOUNTER — Emergency Department: Payer: Federal, State, Local not specified - PPO

## 2018-07-08 DIAGNOSIS — Z87891 Personal history of nicotine dependence: Secondary | ICD-10-CM | POA: Insufficient documentation

## 2018-07-08 DIAGNOSIS — R0789 Other chest pain: Secondary | ICD-10-CM | POA: Diagnosis not present

## 2018-07-08 DIAGNOSIS — Z923 Personal history of irradiation: Secondary | ICD-10-CM | POA: Diagnosis not present

## 2018-07-08 DIAGNOSIS — Z79899 Other long term (current) drug therapy: Secondary | ICD-10-CM | POA: Diagnosis not present

## 2018-07-08 DIAGNOSIS — R0602 Shortness of breath: Secondary | ICD-10-CM | POA: Insufficient documentation

## 2018-07-08 DIAGNOSIS — J40 Bronchitis, not specified as acute or chronic: Secondary | ICD-10-CM | POA: Diagnosis not present

## 2018-07-08 DIAGNOSIS — Z853 Personal history of malignant neoplasm of breast: Secondary | ICD-10-CM | POA: Diagnosis not present

## 2018-07-08 DIAGNOSIS — Z9221 Personal history of antineoplastic chemotherapy: Secondary | ICD-10-CM | POA: Diagnosis not present

## 2018-07-08 DIAGNOSIS — R05 Cough: Secondary | ICD-10-CM | POA: Diagnosis present

## 2018-07-08 LAB — COMPREHENSIVE METABOLIC PANEL
ALT: 29 U/L (ref 0–44)
AST: 19 U/L (ref 15–41)
Albumin: 4.4 g/dL (ref 3.5–5.0)
Alkaline Phosphatase: 102 U/L (ref 38–126)
Anion gap: 11 (ref 5–15)
BUN: 18 mg/dL (ref 6–20)
CO2: 24 mmol/L (ref 22–32)
Calcium: 9.3 mg/dL (ref 8.9–10.3)
Chloride: 102 mmol/L (ref 98–111)
Creatinine, Ser: 0.79 mg/dL (ref 0.44–1.00)
GFR calc Af Amer: >60 mL/min (ref >60)
GFR calc non Af Amer: >60 mL/min (ref >60)
Glucose, Bld: 102 mg/dL — ABNORMAL HIGH (ref 70–99)
Potassium: 3.9 mmol/L (ref 3.5–5.1)
Sodium: 137 mmol/L (ref 135–145)
Total Bilirubin: 0.6 mg/dL (ref 0.3–1.2)
Total Protein: 7 g/dL (ref 6.5–8.1)

## 2018-07-08 LAB — CBC WITH DIFFERENTIAL/PLATELET
Abs Immature Granulocytes: 0.02 10*3/uL (ref 0.00–0.07)
Basophils Absolute: 0 10*3/uL (ref 0.0–0.1)
Basophils Relative: 1 %
Eosinophils Absolute: 0.1 10*3/uL (ref 0.0–0.5)
Eosinophils Relative: 1 %
HCT: 41.7 % (ref 36.0–46.0)
Hemoglobin: 13.8 g/dL (ref 12.0–15.0)
Immature Granulocytes: 0 %
Lymphocytes Relative: 34 %
Lymphs Abs: 2.6 10*3/uL (ref 0.7–4.0)
MCH: 30.9 pg (ref 26.0–34.0)
MCHC: 33.1 g/dL (ref 30.0–36.0)
MCV: 93.5 fL (ref 80.0–100.0)
Monocytes Absolute: 0.7 10*3/uL (ref 0.1–1.0)
Monocytes Relative: 9 %
Neutro Abs: 4.2 10*3/uL (ref 1.7–7.7)
Neutrophils Relative %: 55 %
Platelets: 315 10*3/uL (ref 150–400)
RBC: 4.46 MIL/uL (ref 3.87–5.11)
RDW: 12.1 % (ref 11.5–15.5)
WBC: 7.7 10*3/uL (ref 4.0–10.5)
nRBC: 0 % (ref 0.0–0.2)

## 2018-07-08 NOTE — ED Notes (Signed)
Pt ambulatory to stat registration to ask about wait times; understands the department is very busy at this time and it is difficult to give out estimated waits; pt verbalized understanding; given warm blanket for comfort;

## 2018-07-08 NOTE — ED Notes (Signed)
Patient transported to X-ray 

## 2018-07-08 NOTE — ED Triage Notes (Signed)
Pt states cough and SOB x 2 weeks, states previously negative covid test. Pt states non-productive dry cough. Pt c/o generalized chest tightness with SOB.

## 2018-07-09 ENCOUNTER — Emergency Department
Admission: EM | Admit: 2018-07-09 | Discharge: 2018-07-09 | Disposition: A | Discharge status: Home / Self Care | Payer: Federal, State, Local not specified - PPO | Attending: Emergency Medicine | Admitting: Emergency Medicine

## 2018-07-09 DIAGNOSIS — J40 Bronchitis, not specified as acute or chronic: Secondary | ICD-10-CM

## 2018-07-09 LAB — URINALYSIS, COMPLETE (UACMP) WITH MICROSCOPIC
Bacteria, UA: NONE SEEN
Bilirubin Urine: NEGATIVE
Glucose, UA: NEGATIVE mg/dL
Hgb urine dipstick: NEGATIVE
Ketones, ur: NEGATIVE mg/dL
Nitrite: NEGATIVE
Protein, ur: NEGATIVE mg/dL
Specific Gravity, Urine: 1.025 (ref 1.005–1.030)
pH: 5 (ref 5.0–8.0)

## 2018-07-09 LAB — TROPONIN I (HIGH SENSITIVITY)
Troponin I (High Sensitivity): 2 ng/L (ref <18)
Troponin I (High Sensitivity): <2 ng/L (ref <18)

## 2018-07-09 MED ORDER — PREDNISONE 20 MG PO TABS: ORAL_TABLET | ORAL | 0 refills | Status: DC

## 2018-07-09 MED ORDER — HYDROCOD POLST-CPM POLST ER 10-8 MG/5ML PO SUER: 5.0000 mL | Freq: Two times a day (BID) | ORAL | 0 refills | Status: DC

## 2018-07-09 MED ORDER — FOSFOMYCIN TROMETHAMINE 3 G PO PACK
3.0000 g | PACK | Freq: Once | ORAL | Status: AC
  Administered 2018-07-09: 3 g via ORAL
  Filled 2018-07-09: qty 3

## 2018-07-09 MED ORDER — HYDROCOD POLST-CPM POLST ER 10-8 MG/5ML PO SUER
5.0000 mL | Freq: Once | ORAL | Status: AC
  Administered 2018-07-09: 5 mL via ORAL
  Filled 2018-07-09: qty 5

## 2018-07-09 MED ORDER — METHYLPREDNISOLONE SODIUM SUCC 125 MG IJ SOLR
125.0000 mg | Freq: Once | INTRAMUSCULAR | Status: AC
  Administered 2018-07-09: 125 mg via INTRAVENOUS
  Filled 2018-07-09: qty 2

## 2018-07-09 MED ORDER — IPRATROPIUM-ALBUTEROL 0.5-2.5 (3) MG/3ML IN SOLN
3.0000 mL | Freq: Once | RESPIRATORY_TRACT | Status: AC
  Administered 2018-07-09: 3 mL via RESPIRATORY_TRACT
  Filled 2018-07-09: qty 3

## 2018-07-09 MED ORDER — SODIUM CHLORIDE 0.9 % IV BOLUS
500.0000 mL | Freq: Once | INTRAVENOUS | Status: AC
  Administered 2018-07-09: 500 mL via INTRAVENOUS

## 2018-07-09 NOTE — ED Notes (Signed)
Report from kate, rn.  

## 2018-07-09 NOTE — Discharge Instructions (Addendum)
1.  Take Prednisone 60 mg daily x4 days. 2.  You may take Tussionex as needed for cough. 3.  Use your nebulizer as needed for breathing difficulty. 4.  Return to the ER for worsening symptoms, persistent vomiting, difficulty breathing or other concerns.

## 2018-07-09 NOTE — ED Provider Notes (Signed)
[facility] Emergency Department Provider Note   ____________________________________________   First MD Initiated Contact with Patient 07/09/18 0026     (approximate)  I have reviewed the triage vital signs and the nursing notes.   HISTORY  Chief Complaint Shortness of Breath and Cough    HPI Katrina Holland is a 58 y.o. female who presents to the ED from home with a chief complaint of nonproductive cough and shortness of breath x2 weeks.  Symptoms are associated with chest tightness.  Saw her PCP at the onset of her illness with a negative COVID test.  She was placed on a weeks worth of doxycycline and prednisone 20 mg daily.  States she feels like she is getting worse instead of better.  Denies associated fever, abdominal pain, nausea, vomiting, diarrhea.  Denies recent travel, trauma or exposure to persons diagnosed with coronavirus.       Past Medical History:  Diagnosis Date   Anxiety    Asthma    Breast CA ([place])    Breast cancer ([place]) 2014   left breast   Endometriosis 2001   GERD (gastroesophageal reflux disease)    Irritable bowel syndrome    Malignant neoplasm of upper-outer quadrant of female breast ([place]) 01/30/2012   Left breast, Invasive mammary cancer, no special type: T1c,N0,M0; ER 90%, PR 90%, no amplification of HER-2/neu, aromatase inhibitors discontinued September 2015 secondary to joint symptoms, tamoxifen initiated by Dr. Ma Hillock..   Personal history of chemotherapy    Personal history of radiation therapy    Pulmonary hypertension ([place]) 2013   Sleep apnea 2007   Sleep apnea 2007    Patient Active Problem List   Diagnosis Date Noted   Aromatase inhibitor use 01/31/2018   Breast pain, left 08/24/2016   Malignant neoplasm of overlapping sites of left breast in female, estrogen receptor positive ([place]) 09/23/2015   Breathlessness on exertion 03/18/2015   Headache, migraine 03/18/2015   Arthritis, degenerative  03/18/2015   Premature ovarian failure 03/18/2015   Adult BMI 30+ 01/29/2015   Hot flash due to medication 01/28/2015   Degenerative arthritis of hip 01/28/2015   RAD (reactive airway disease) 01/28/2015   Obesity 08/29/2014   Degeneration of intervertebral disc of lumbar region 10/09/2013   Lumbosacral radiculitis 10/09/2013   Narrowing of intervertebral disc space 08/09/2010   Recurrent major depressive episodes ([place]) 08/09/2010   Disc narrowing 08/09/2010    Past Surgical History:  Procedure Laterality Date   BREAST BIOPSY Left 2013   neg,    BREAST BIOPSY Left 01/14   invasive ductal carcinoma   BREAST BIOPSY Left 12/14   benign, done at Dr. Dwyane Luo office, [place]   BREAST ENHANCEMENT SURGERY  2014,05/2013   BREAST LUMPECTOMY Left 2014   invasive ductal carcinoma   BREAST SURGERY Left 01/30/2012   Wide excision, sentinel node biopsy   BREAST SURGERY Left 02/09/2012   Left breast reduction/reconstruction: Nicholaus Bloom, MD.   BREAST SURGERY Left December 26, 2012   ultrasound-guided biopsy, post surgical change.   COLONOSCOPY  2012   DILATION AND CURETTAGE OF UTERUS  2011   EXPLORATORY LAPAROTOMY  2001   FACIAL COSMETIC SURGERY  2015   HERNIA REPAIR  5027   NISSEN FUNDOPLICATION  7412   OVARY SURGERY Right 2001   PORT A CATH REVISION  2014   REDUCTION MAMMAPLASTY Left 2014   REDUCTION MAMMAPLASTY Right 2015   RIGHT HEART CATH  2013   TONSILLECTOMY  1969  Prior to Admission medications   Medication Sig Start Date End Date Taking? Authorizing Provider  chlorpheniramine-HYDROcodone (TUSSIONEX PENNKINETIC ER) 10-8 MG/5ML SUER Take 5 mLs by mouth 2 (two) times daily. 07/09/18   Paulette Blanch, MD  diclofenac (VOLTAREN) 75 MG EC tablet Take 75 mg by mouth 2 (two) times daily.  03/10/15   [provider]  letrozole (FEMARA) 2.5 MG tablet Take 1 tablet (2.5 mg total) by mouth daily. 02/05/18   Cammie Sickle, MD  montelukast  (SINGULAIR) 10 MG tablet TAKE ONE TABLET BY MOUTH AT BEDTIME 07/20/15   Tanda Rockers, MD  pravastatin (PRAVACHOL) 10 MG tablet Take 10 mg by mouth at bedtime. 10/30/17   [provider]  predniSONE (DELTASONE) 20 MG tablet 3 tablets PO qd x 4 days 07/09/18   Paulette Blanch, MD  venlafaxine [facility]) 75 MG tablet TAKE ONE TABLET BY MOUTH TWICE DAILY WITH MEALS 09/05/16   Cammie Sickle, MD    Allergies Augmentin [amoxicillin-pot clavulanate]  Family History  Problem Relation Age of Onset   Colon cancer Paternal Grandfather    Breast cancer Maternal Aunt 50   Breast cancer Cousin 35   Breast cancer Cousin 35   Breast cancer Cousin 77    Social History Social History   Tobacco Use   Smoking status: Former Smoker    Packs/day: 0.10    Years: 25.00    Pack years: 2.50    Types: Cigarettes    Quit date: 01/11/2007    Years since quitting: 11.4   Smokeless tobacco: Never Used   Tobacco comment: "social smoker" per pt.  Substance Use Topics   Alcohol use: No   Drug use: No    Review of Systems  Constitutional: No fever/chills Eyes: No visual changes. ENT: No sore throat. Cardiovascular: Positive for chest tightness. Respiratory: Positive for dry cough and shortness of breath. Gastrointestinal: No abdominal pain.  No nausea, no vomiting.  No diarrhea.  No constipation. Genitourinary: Negative for dysuria. Musculoskeletal: Negative for back pain. Skin: Negative for rash. Neurological: Negative for headaches, focal weakness or numbness.   ____________________________________________   PHYSICAL EXAM:  VITAL SIGNS: ED Triage Vitals  Enc Vitals Group     BP 07/08/18 1713 113/75     Pulse Rate 07/08/18 1713 84     Resp 07/08/18 1713 (!) 28     Temp 07/08/18 1713 98.2 F (36.8 C)     Temp Source 07/08/18 1713 Oral     SpO2 07/08/18 1712 99 %     Weight 07/08/18 1713 210 lb (95.3 kg)     Height 07/08/18 1713 '5\' 1"'  (1.549 m)     Head  Circumference --      Peak Flow --      Pain Score 07/08/18 1713 5     Pain Loc --      Pain Edu? --      Excl. in [place]? --     Constitutional: Alert and oriented. Well appearing and in no acute distress. Eyes: Conjunctivae are normal. PERRL. EOMI. Head: Atraumatic. Nose: No congestion/rhinnorhea. Mouth/Throat: Mucous membranes are moist.  Oropharynx non-erythematous.  Mildly hoarse voice. Neck: No stridor.  Supple neck without meningismus. Cardiovascular: Normal rate, regular rhythm. Grossly normal heart sounds.  Good peripheral circulation. Respiratory: Normal respiratory effort.  No retractions. Lungs with mild wheezing. Gastrointestinal: Soft and nontender. No distention. No abdominal bruits. No CVA tenderness. Musculoskeletal: No lower extremity tenderness nor edema.  No joint effusions. Neurologic:  Normal speech and language. No gross focal neurologic deficits are appreciated. No gait instability. Skin:  Skin is warm, dry and intact. No rash noted. Psychiatric: Mood and affect are normal. Speech and behavior are normal.  ____________________________________________   LABS (all labs ordered are listed, but only abnormal results are displayed)  Labs Reviewed  COMPREHENSIVE METABOLIC PANEL - Abnormal; Notable for the following components:      Result Value   Glucose, Bld 102 (*)    All other components within normal limits  URINALYSIS, COMPLETE (UACMP) WITH MICROSCOPIC - Abnormal; Notable for the following components:   Color, Urine YELLOW (*)    APPearance HAZY (*)    Leukocytes,Ua TRACE (*)    All other components within normal limits  CBC WITH DIFFERENTIAL/PLATELET  TROPONIN I (HIGH SENSITIVITY)  TROPONIN I (HIGH SENSITIVITY)   ____________________________________________  EKG  ED ECG REPORT I, Evalynne Locurto J, the attending physician, personally viewed and interpreted this ECG.   Date: 07/09/2018  EKG Time: 1706  Rate: 87  Rhythm: normal EKG, normal sinus rhythm   Axis: Normal  Intervals:none  ST&T Change: Nonspecific  ____________________________________________  RADIOLOGY  ED MD interpretation: Bronchitic changes  Official radiology report(s): Dg Chest 2 View  Result Date: 07/08/2018 CLINICAL DATA:  Cough and shortness of breath for 2 weeks, COVID-19 negative test, history asthma, breast cancer, GERD, pulmonary hypertension EXAM: CHEST - 2 VIEW COMPARISON:  06/18/2014 FINDINGS: Normal heart size, mediastinal contours, and pulmonary vascularity. Lungs clear. No infiltrate, pleural effusion or pneumothorax. Minimal central peribronchial thickening, could be related to patient's history of asthma. Bones unremarkable. IMPRESSION: No minimal bronchitic versus asthmatic changes. No acute infiltrate. Electronically Signed   By: Lavonia Dana M.D.   On: 07/08/2018 23:40    ____________________________________________   PROCEDURES  Procedure(s) performed (including Critical Care):  Procedures   ____________________________________________   INITIAL IMPRESSION / ASSESSMENT AND PLAN / ED COURSE  As part of my medical decision making, I reviewed the following data within the [place] notes reviewed and incorporated, Labs reviewed, EKG interpreted, Old chart reviewed, Radiograph reviewed and Notes from prior ED visits     Katrina Holland was evaluated in Emergency Department on 07/09/2018 for the symptoms described in the history of present illness. She was evaluated in the context of the global COVID-19 pandemic, which necessitated consideration that the patient might be at risk for infection with the SARS-CoV-2 virus that causes COVID-19. Institutional protocols and algorithms that pertain to the evaluation of patients at risk for COVID-19 are in a state of rapid change based on information released by regulatory bodies including the CDC and federal and state organizations. These policies and algorithms were followed during the  patient's care in the ED.   58 year old female with a history of RAD who presents with a two-week history of dry cough, wheezing and shortness of breath. Differential includes, but is not limited to, viral syndrome, bronchitis including COPD exacerbation, pneumonia, reactive airway disease including asthma, CHF including exacerbation with or without pulmonary/interstitial edema, pneumothorax, ACS, thoracic trauma, and pulmonary embolism.  Reviewed patient's chart and see that she had a CT chest in April which did not demonstrate pulmonary embolism.  Clinically I have low suspicion for PE.  Feel symptoms more consistent with bronchitis.  Will administer IV Solu-Medrol, DuoNeb, Tussionex for cough, check troponin and reassess.  Clinical Course as of Jul 08 312  Mon Jul 09, 2018  0231 Patient feeling better.  Nurse sending repeat troponin.   [  JS]  0311 Repeat troponin remains negative.  Will administer fosfomycin prior to discharge.  Will discharge patient home on prednisone 5-day burst as well as Tussionex to use as needed.  She is feeling significantly better and eager for discharge home.  Strict return precautions given.  Patient verbalizes understanding and agrees with plan of care.   [JS]    Clinical Course User Index [JS] Paulette Blanch, MD     ____________________________________________   FINAL CLINICAL IMPRESSION(S) / ED DIAGNOSES  Final diagnoses:  Bronchitis     ED Discharge Orders         Ordered    predniSONE (DELTASONE) 20 MG tablet     07/09/18 0313    chlorpheniramine-HYDROcodone (TUSSIONEX PENNKINETIC ER) 10-8 MG/5ML SUER  2 times daily     07/09/18 6184           Note:  This document was prepared using Dragon voice recognition software and may include unintentional dictation errors.   Paulette Blanch, MD 07/09/18 [phone]

## 2018-07-26 ENCOUNTER — Telehealth: Payer: Self-pay | Admitting: *Deleted

## 2018-07-26 ENCOUNTER — Other Ambulatory Visit: Payer: Self-pay | Admitting: *Deleted

## 2018-07-26 DIAGNOSIS — R05 Cough: Secondary | ICD-10-CM

## 2018-07-26 DIAGNOSIS — R059 Cough, unspecified: Secondary | ICD-10-CM

## 2018-07-26 DIAGNOSIS — R0602 Shortness of breath: Secondary | ICD-10-CM

## 2018-07-26 NOTE — Telephone Encounter (Signed)
Patient called and states she has had bronchitis for 5 weeks now and she is asking about her appointment next week and does not want to miss it. She is asking if she needs another COVID test, she had one 5 weeks ago that was negative. Please advise

## 2018-07-26 NOTE — Telephone Encounter (Signed)
Heather- Please order COVID-19 test again;  and also, we can change her appt to [facility] if she is interested. Or else give her the option of moving her appt out by couple of weeks/in clinic.  Thanks GB

## 2018-07-26 NOTE — Telephone Encounter (Signed)
Spoke with patient. Apt given for 8 am tomorrow at [facility] for covid testing. Orders entered. Future apts adjusted to 08/15/18. Pt understands to self isolate until after covid test results are known. She gave verbal understanding of her new apts times and appreciated the call back.

## 2018-07-26 NOTE — Telephone Encounter (Signed)
Dr. B - please advise. 

## 2018-07-27 ENCOUNTER — Other Ambulatory Visit: Payer: Self-pay

## 2018-07-27 DIAGNOSIS — R0602 Shortness of breath: Secondary | ICD-10-CM

## 2018-07-27 DIAGNOSIS — R059 Cough, unspecified: Secondary | ICD-10-CM

## 2018-07-27 DIAGNOSIS — R05 Cough: Secondary | ICD-10-CM

## 2018-07-30 ENCOUNTER — Other Ambulatory Visit: Payer: Self-pay | Admitting: Internal Medicine

## 2018-07-30 ENCOUNTER — Encounter: Payer: Self-pay | Admitting: General Surgery

## 2018-07-30 ENCOUNTER — Other Ambulatory Visit: Payer: Self-pay | Admitting: *Deleted

## 2018-07-30 DIAGNOSIS — Z853 Personal history of malignant neoplasm of breast: Secondary | ICD-10-CM

## 2018-08-01 ENCOUNTER — Other Ambulatory Visit: Payer: Federal, State, Local not specified - PPO

## 2018-08-01 ENCOUNTER — Ambulatory Visit: Payer: Self-pay | Admitting: Internal Medicine

## 2018-08-01 LAB — NOVEL CORONAVIRUS, NAA: SARS-CoV-2, NAA: NOT DETECTED

## 2018-08-01 NOTE — Telephone Encounter (Signed)
Left vm for patient-her covid test results are negative.

## 2018-08-02 ENCOUNTER — Telehealth: Payer: Self-pay | Admitting: *Deleted

## 2018-08-02 NOTE — Telephone Encounter (Signed)
Patient requesting a copy of her COVID test be sent to her ENT doctor.

## 2018-08-03 NOTE — Telephone Encounter (Signed)
Patient reports that she has seen several different doctors (PCP, Pulmonology, ER) for fevers, UTI, SHORT OF BREATH, right kidney pain, and laryngitis and as been on antibiotics. She has had 2 COVID test which are negative and is now referred to ENT. She is not getting any answers, she is asking what to do or where to go. She wants to see Dr B, but if has to will await her appointment 8/5 stating she was supposed to have already seen him.

## 2018-08-03 NOTE — Telephone Encounter (Signed)
covid Results routed to [facility]

## 2018-08-03 NOTE — Telephone Encounter (Signed)
Spoke to patient for more than 20 minutes; the last 6 weeks; intermittent fever/feeling poorly/dry cough.  Evaluated by PCP/pulmonary/ER, COVID testing x2-negative.  Awaiting evaluation with ENT on the next week.  Recommend evaluation with symptom management clinic/virtual visit-on 7/27.

## 2018-08-03 NOTE — Telephone Encounter (Signed)
Please find out the specific concerns. If this is related to her upper respiratory symptoms/bronchitis, she will need to contact her pcp to address these concerns. We only follow-up with her regarding her breast cancer per Dr. Jacinto Reap.

## 2018-08-03 NOTE — Telephone Encounter (Signed)
Dr. B- pls advise. 

## 2018-08-03 NOTE — Telephone Encounter (Addendum)
Dr Tami Ribas is her ENT, She is asking to see Dr B next Wednesday if at all possible because she is having problems, either very early or after lunch as she has an appointment with Dr Tami Ribas at 10 AM that day. Patient states she cannot get into MyChart right now, she cannot log in to it and has tried the password reset and still cannot get into it.

## 2018-08-06 ENCOUNTER — Other Ambulatory Visit: Payer: Self-pay | Admitting: Oncology

## 2018-08-06 ENCOUNTER — Inpatient Hospital Stay: Payer: Federal, State, Local not specified - PPO

## 2018-08-06 ENCOUNTER — Ambulatory Visit
Admission: RE | Admit: 2018-08-06 | Discharge: 2018-08-06 | Disposition: A | Discharge status: Home / Self Care | Payer: Federal, State, Local not specified - PPO | Source: Ambulatory Visit | Attending: Oncology | Admitting: Oncology

## 2018-08-06 ENCOUNTER — Other Ambulatory Visit: Payer: Self-pay

## 2018-08-06 ENCOUNTER — Inpatient Hospital Stay: Payer: Federal, State, Local not specified - PPO | Attending: Nurse Practitioner | Admitting: Oncology

## 2018-08-06 DIAGNOSIS — Z170 Estrogen receptor positive status [ER+]: Secondary | ICD-10-CM

## 2018-08-06 DIAGNOSIS — R0602 Shortness of breath: Secondary | ICD-10-CM

## 2018-08-06 DIAGNOSIS — Z79811 Long term (current) use of aromatase inhibitors: Secondary | ICD-10-CM | POA: Insufficient documentation

## 2018-08-06 DIAGNOSIS — C50812 Malignant neoplasm of overlapping sites of left female breast: Secondary | ICD-10-CM

## 2018-08-06 DIAGNOSIS — R109 Unspecified abdominal pain: Secondary | ICD-10-CM

## 2018-08-06 DIAGNOSIS — R05 Cough: Secondary | ICD-10-CM

## 2018-08-06 DIAGNOSIS — Z79899 Other long term (current) drug therapy: Secondary | ICD-10-CM | POA: Diagnosis not present

## 2018-08-06 DIAGNOSIS — R059 Cough, unspecified: Secondary | ICD-10-CM

## 2018-08-06 LAB — CBC WITH DIFFERENTIAL/PLATELET
Abs Immature Granulocytes: 0.02 10*3/uL (ref 0.00–0.07)
Basophils Absolute: 0 10*3/uL (ref 0.0–0.1)
Basophils Relative: 1 %
Eosinophils Absolute: 0.1 10*3/uL (ref 0.0–0.5)
Eosinophils Relative: 2 %
HCT: 38.5 % (ref 36.0–46.0)
Hemoglobin: 13 g/dL (ref 12.0–15.0)
Immature Granulocytes: 0 %
Lymphocytes Relative: 36 %
Lymphs Abs: 2.2 10*3/uL (ref 0.7–4.0)
MCH: 31.2 pg (ref 26.0–34.0)
MCHC: 33.8 g/dL (ref 30.0–36.0)
MCV: 92.3 fL (ref 80.0–100.0)
Monocytes Absolute: 0.5 10*3/uL (ref 0.1–1.0)
Monocytes Relative: 9 %
Neutro Abs: 3.1 10*3/uL (ref 1.7–7.7)
Neutrophils Relative %: 52 %
Platelets: 303 10*3/uL (ref 150–400)
RBC: 4.17 MIL/uL (ref 3.87–5.11)
RDW: 12.1 % (ref 11.5–15.5)
WBC: 6 10*3/uL (ref 4.0–10.5)
nRBC: 0 % (ref 0.0–0.2)

## 2018-08-06 LAB — URINALYSIS, COMPLETE (UACMP) WITH MICROSCOPIC
Bacteria, UA: NONE SEEN
Bilirubin Urine: NEGATIVE
Glucose, UA: NEGATIVE mg/dL
Hgb urine dipstick: NEGATIVE
Ketones, ur: NEGATIVE mg/dL
Nitrite: NEGATIVE
Protein, ur: NEGATIVE mg/dL
Specific Gravity, Urine: >1.046 — ABNORMAL HIGH (ref 1.005–1.030)
pH: 5 (ref 5.0–8.0)

## 2018-08-06 LAB — COMPREHENSIVE METABOLIC PANEL
ALT: 44 U/L (ref 0–44)
AST: 21 U/L (ref 15–41)
Albumin: 4 g/dL (ref 3.5–5.0)
Alkaline Phosphatase: 84 U/L (ref 38–126)
Anion gap: 10 (ref 5–15)
BUN: 16 mg/dL (ref 6–20)
CO2: 24 mmol/L (ref 22–32)
Calcium: 9.4 mg/dL (ref 8.9–10.3)
Chloride: 105 mmol/L (ref 98–111)
Creatinine, Ser: 0.68 mg/dL (ref 0.44–1.00)
GFR calc Af Amer: >60 mL/min (ref >60)
GFR calc non Af Amer: >60 mL/min (ref >60)
Glucose, Bld: 110 mg/dL — ABNORMAL HIGH (ref 70–99)
Potassium: 3.9 mmol/L (ref 3.5–5.1)
Sodium: 139 mmol/L (ref 135–145)
Total Bilirubin: 0.5 mg/dL (ref 0.3–1.2)
Total Protein: 6.7 g/dL (ref 6.5–8.1)

## 2018-08-06 MED ORDER — IOHEXOL 300 MG/ML  SOLN
75.0000 mL | Freq: Once | INTRAMUSCULAR | Status: AC | PRN
  Administered 2018-08-06: 75 mL via INTRAVENOUS

## 2018-08-06 NOTE — Progress Notes (Signed)
Screening mammogram orders placed. She is > 5 years out.  Faythe Casa, NP 08/06/2018 1:38 PM

## 2018-08-06 NOTE — Progress Notes (Signed)
Virtual Visit via Telephone Note  I connected with Katrina Holland on 08/06/18 at 10:45 AM EDT by telephone and verified that I am speaking with the correct person using two identifiers.  Location: Patient: Home Provider: Home   I discussed the limitations, risks, security and privacy concerns of performing an evaluation and management service by telephone and the availability of in person appointments. I also discussed with the patient that there may be a patient responsible charge related to this service. The patient expressed understanding and agreed to proceed.  History of Present Illness:  Oncology History Overview Note  # JAN 2014- LEFT BREAST CA IDC; pT1c (1.2cm) pN0 [Stage I] ER/PR > 90%; her 2 Neu POS; AC x4- Taxol-Herceptin; Intol to AI; March 2015- Started TAM [stopped- Sep 2017]; OCT 2017- Post menopausal [estradiol/FSH]; NOV 1st 2017- Start ARIMIDEX  # DEC 2016- Start Effexor   # Right oopherectomy; Left intact ovary-intact [last periods late 90s]  DIAGNOSIS:Breast cancer  STAGE:  I       ;GOALS: cure  CURRENT/MOST RECENT THERAPY: anastrazole    Malignant neoplasm of overlapping sites of left breast in female, estrogen receptor positive ([place])   Patient presents to [facility] for complaints of shortness of breath, intermittent fevers, UTI, right kidney pain and laryngitis. She has been on antibiotics and has two negative COVID-19 tests. She has been referred to Dr. Tami Ribas for further evaluation. She is scheduled to see Dr. Rogue Bussing on 08/15/18 for follow-up of breast cancer.  Review of records shows Chest X-ray on 07/08/18 (negative), Covid negative (07/27/18) and 14 day course of Doxycycline and prednisone 20 mg daily at the end of June 2020. Prednisone has been extended X 6 weeks per patient. She was evaluated in ED (07/09/18) and given IV solu-medrol, duoneb, tussionex and had labs to r/o cardiac concerns. She was referred to Dr. [name] (Pulmonolgy) on 07/16/18 and started on  Tessalon, prednisone, albuterol inhaler, recommended allergy testing and weight loss and additional lab work including Ck ige, eosinophil count.   Today, she tells me she fells "bad". She continues to get weaker and weaker daily. She eating less d/t cough and having deceased appetite. She stays hydrated. Denies weight loss. She has self stopped prednisone because she started to swell and did not think she was improving. She states Tussinex and Tessalon are not helping for her cough. She continues to have pain on right side near her kidney. She denies dysuria, hesitancy or hematuria. She was given one dose of abx while in the emergency room for UTI. UA showed trace leukocytes She continues to have intermittent fevers; last on Thursday and unclear tmax. She has chronic constipation and intermittent diarrhea but this is normal for her.   Observations/Objective: LImited d/t telephone virtual visit  Review of Systems  Constitutional: Positive for fever and malaise/fatigue. Negative for chills and weight loss.  HENT: Negative for congestion, ear pain and tinnitus.   Eyes: Negative.  Negative for blurred vision and double vision.  Respiratory: Positive for cough and shortness of breath. Negative for sputum production.   Cardiovascular: Positive for chest pain (heaviness) and leg swelling (d/t steroids). Negative for palpitations.  Gastrointestinal: Positive for constipation (chronic). Negative for abdominal pain, diarrhea, nausea and vomiting.  Genitourinary: Positive for flank pain (right sided ). Negative for dysuria, frequency and urgency.  Musculoskeletal: Positive for back pain and myalgias. Negative for falls.  Skin: Negative.  Negative for rash.  Neurological: Positive for weakness. Negative for headaches.  Endo/Heme/Allergies: Negative.  Does  not bruise/bleed easily.  Psychiatric/Behavioral: Negative.  Negative for depression. The patient is not nervous/anxious and does not have insomnia.     Assessment and Plan:  Breast Cancer: Scheduled to see Dr. Rogue Bussing next week. Currently on surveillance. Last mammogram is from August 2019 BI-RADS 2. Will get mammogram scheduled for August 2020.   SOB/Cough/Laryngitis: Previously tried steroids and abx without resolution and worsening symptoms. Has been evaluated by PCP, Pulmonology and ED. Last x-ray in June and last CT chest April 2020.   Right sided flank pain: Recheck UA/UC. Previously treated with 1 dose antibiotic in ED.   Plan: Repeat CT Chest. Scheduled for today at 3pm. Schedule Mammogram ASAP. Collect UA/Urine Culture. Get new thermometer and keep diary of temperatures. Continue Tessalon and Tussionex PRN for cough. Continue albuterol and advair for SOB.  Needs lab work: Ca 27.29, CBC, CMP   Follow Up Instructions:  Disposition: Will call with results from CT scan, UA and labs. Keep appt with Dr. Tami Ribas on Wednesday.  Keep appt with Dr. Rogue Bussing next week.  I discussed the assessment and treatment plan with the patient. The patient was provided an opportunity to ask questions and all were answered. The patient agreed with the plan and demonstrated an understanding of the instructions.   The patient was advised to call back or seek an in-person evaluation if the symptoms worsen or if the condition fails to improve as anticipated.  I provided 45 minutes of non-face-to-face time during this encounter.  Jacquelin Hawking, NP

## 2018-08-07 ENCOUNTER — Other Ambulatory Visit: Payer: Self-pay | Admitting: Oncology

## 2018-08-07 DIAGNOSIS — Z170 Estrogen receptor positive status [ER+]: Secondary | ICD-10-CM

## 2018-08-07 DIAGNOSIS — C50812 Malignant neoplasm of overlapping sites of left female breast: Secondary | ICD-10-CM

## 2018-08-07 LAB — CANCER ANTIGEN 27.29: CA 27.29: 10.5 U/mL (ref 0.0–38.6)

## 2018-08-07 MED ORDER — LEVOFLOXACIN 500 MG PO TABS: 500.0000 mg | ORAL_TABLET | Freq: Every day | ORAL | 0 refills | Status: DC

## 2018-08-07 NOTE — Progress Notes (Signed)
RX Levaquin 500 mg daily X 10 days for possible lung infection. Patient has previously been treated with steroids in the past without improvement.  She self stopped her 20 mg daily prednisone last week.  She is scheduled to be seen by Dr. Tami Ribas with ENT tomorrow.   Faythe Casa, NP 08/07/2018 10:14 AM

## 2018-08-14 ENCOUNTER — Other Ambulatory Visit: Payer: Self-pay

## 2018-08-14 DIAGNOSIS — C50812 Malignant neoplasm of overlapping sites of left female breast: Secondary | ICD-10-CM

## 2018-08-14 DIAGNOSIS — Z170 Estrogen receptor positive status [ER+]: Secondary | ICD-10-CM

## 2018-08-15 ENCOUNTER — Other Ambulatory Visit: Payer: Self-pay

## 2018-08-15 ENCOUNTER — Inpatient Hospital Stay: Payer: Federal, State, Local not specified - PPO | Attending: Internal Medicine

## 2018-08-15 ENCOUNTER — Inpatient Hospital Stay (HOSPITAL_BASED_OUTPATIENT_CLINIC_OR_DEPARTMENT_OTHER): Payer: Federal, State, Local not specified - PPO | Admitting: Internal Medicine

## 2018-08-15 VITALS — BP 116/71 | HR 80 | Temp 95.3°F | Resp 20 | Wt 206.3 lb

## 2018-08-15 DIAGNOSIS — J988 Other specified respiratory disorders: Secondary | ICD-10-CM

## 2018-08-15 DIAGNOSIS — Z923 Personal history of irradiation: Secondary | ICD-10-CM | POA: Insufficient documentation

## 2018-08-15 DIAGNOSIS — Z791 Long term (current) use of non-steroidal anti-inflammatories (NSAID): Secondary | ICD-10-CM | POA: Insufficient documentation

## 2018-08-15 DIAGNOSIS — G4730 Sleep apnea, unspecified: Secondary | ICD-10-CM | POA: Insufficient documentation

## 2018-08-15 DIAGNOSIS — J45909 Unspecified asthma, uncomplicated: Secondary | ICD-10-CM | POA: Diagnosis not present

## 2018-08-15 DIAGNOSIS — C50812 Malignant neoplasm of overlapping sites of left female breast: Secondary | ICD-10-CM

## 2018-08-15 DIAGNOSIS — Z87891 Personal history of nicotine dependence: Secondary | ICD-10-CM | POA: Diagnosis not present

## 2018-08-15 DIAGNOSIS — Z170 Estrogen receptor positive status [ER+]: Secondary | ICD-10-CM

## 2018-08-15 DIAGNOSIS — F419 Anxiety disorder, unspecified: Secondary | ICD-10-CM | POA: Insufficient documentation

## 2018-08-15 DIAGNOSIS — N951 Menopausal and female climacteric states: Secondary | ICD-10-CM | POA: Diagnosis not present

## 2018-08-15 DIAGNOSIS — M8580 Other specified disorders of bone density and structure, unspecified site: Secondary | ICD-10-CM | POA: Diagnosis not present

## 2018-08-15 DIAGNOSIS — Z79811 Long term (current) use of aromatase inhibitors: Secondary | ICD-10-CM | POA: Insufficient documentation

## 2018-08-15 DIAGNOSIS — Z9221 Personal history of antineoplastic chemotherapy: Secondary | ICD-10-CM | POA: Diagnosis not present

## 2018-08-15 DIAGNOSIS — Z79899 Other long term (current) drug therapy: Secondary | ICD-10-CM | POA: Insufficient documentation

## 2018-08-15 DIAGNOSIS — B9789 Other viral agents as the cause of diseases classified elsewhere: Secondary | ICD-10-CM

## 2018-08-15 DIAGNOSIS — Z803 Family history of malignant neoplasm of breast: Secondary | ICD-10-CM | POA: Diagnosis not present

## 2018-08-15 LAB — COMPREHENSIVE METABOLIC PANEL
ALT: 38 U/L (ref 0–44)
AST: 26 U/L (ref 15–41)
Albumin: 4 g/dL (ref 3.5–5.0)
Alkaline Phosphatase: 82 U/L (ref 38–126)
Anion gap: 9 (ref 5–15)
BUN: 17 mg/dL (ref 6–20)
CO2: 23 mmol/L (ref 22–32)
Calcium: 9.3 mg/dL (ref 8.9–10.3)
Chloride: 108 mmol/L (ref 98–111)
Creatinine, Ser: 0.83 mg/dL (ref 0.44–1.00)
GFR calc Af Amer: >60 mL/min (ref >60)
GFR calc non Af Amer: >60 mL/min (ref >60)
Glucose, Bld: 109 mg/dL — ABNORMAL HIGH (ref 70–99)
Potassium: 3.7 mmol/L (ref 3.5–5.1)
Sodium: 140 mmol/L (ref 135–145)
Total Bilirubin: 0.6 mg/dL (ref 0.3–1.2)
Total Protein: 6.8 g/dL (ref 6.5–8.1)

## 2018-08-15 LAB — CBC WITH DIFFERENTIAL/PLATELET
Abs Immature Granulocytes: 0.02 10*3/uL (ref 0.00–0.07)
Basophils Absolute: 0 10*3/uL (ref 0.0–0.1)
Basophils Relative: 1 %
Eosinophils Absolute: 0 10*3/uL (ref 0.0–0.5)
Eosinophils Relative: 1 %
HCT: 40.4 % (ref 36.0–46.0)
Hemoglobin: 13.7 g/dL (ref 12.0–15.0)
Immature Granulocytes: 0 %
Lymphocytes Relative: 48 %
Lymphs Abs: 2.6 10*3/uL (ref 0.7–4.0)
MCH: 30.8 pg (ref 26.0–34.0)
MCHC: 33.9 g/dL (ref 30.0–36.0)
MCV: 90.8 fL (ref 80.0–100.0)
Monocytes Absolute: 0.4 10*3/uL (ref 0.1–1.0)
Monocytes Relative: 7 %
Neutro Abs: 2.4 10*3/uL (ref 1.7–7.7)
Neutrophils Relative %: 43 %
Platelets: 272 10*3/uL (ref 150–400)
RBC: 4.45 MIL/uL (ref 3.87–5.11)
RDW: 11.9 % (ref 11.5–15.5)
WBC: 5.4 10*3/uL (ref 4.0–10.5)
nRBC: 0 % (ref 0.0–0.2)

## 2018-08-15 NOTE — Progress Notes (Signed)
Patient brings in a written time line of her symptoms that she has been going through for the past 8 weeks.  Still having symptoms of chest tightness, SOBr and dry cough.  SpO2 95%

## 2018-08-15 NOTE — Assessment & Plan Note (Addendum)
#  Stage I ER/PR positive HER-2/neu positive breast cancer currently on adjuvant femara.  Stable.  No clinical evidence of recurrence.  Continue letrozole.  #Chronic cough/low-grade fevers COVID 19 testing x2 negative.  CT scan subtle upper lobe groundglass opacities.  Reviewed imaging with the patient.  Recommend checking COVID antibodies.  Recommend finishing Levaquin total 7 days/1 more day treatment.  We will also discuss with Dr. [name].  # Hot flashes- grade 1; on Effexor stable.  # Osteopenia- 2019- BMD; continue ca+vitD. On zometa   # DISPOSITION: # lab today-we will call with results. # Follow up in 3 months- No labs  Cc: [name].   Addendum: Discussed with Dr. [name], who kindly agrees to evaluate the patient for symptoms.

## 2018-08-15 NOTE — Progress Notes (Signed)
[facility] OFFICE PROGRESS NOTE  Patient Care Team: Katrina Holland as PCP - General (Internal Medicine) Katrina Holland (General Surgery) Dear, Katrina [place] (Inactive) (Family Medicine)  Cancer Staging No matching staging information was found for the patient.   Oncology History Overview Note  # JAN 2014- LEFT BREAST CA IDC; pT1c (1.2cm) pN0 [Stage I] ER/PR > 90%; her 2 Neu POS; AC x4- Taxol-Herceptin; Intol to AI; March 2015- Started TAM [stopped- Sep 2017]; OCT 2017- Post menopausal [estradiol/FSH]; NOV 1st 2017- Start ARIMIDEX  # DEC 2016- Start Effexor   # Right oopherectomy; Left intact ovary-intact [last periods late 90s]  DIAGNOSIS:Breast cancer  STAGE:  I       ;GOALS: cure  CURRENT/MOST RECENT THERAPY: anastrazole    Malignant neoplasm of overlapping sites of left breast in female, estrogen receptor positive ([place])      INTERVAL HISTORY:  [name] 58 y.o.  female pleasant patient above history of stage I breast cancer ER PR positive currently on adjuvant Arimidex is here for follow-up.  Patient states that she noted to have low-grade fevers and dry cough since middle of June 2020.  Patient was tested negative for COVID at the time.  Patient was treated with doxycycline and steroids with minimal improvement.  Patient developed sore throat laryngitis.  Patient was evaluated by pulmonary/treated with steroids with no improvement.  She is also evaluated by ENT Dr. Tami Holland for her laryngitis.  Noted to have inflammation of the vocal cords otherwise no mass lesions noted.  Patient interim also had a CT scan that showed subtle bilateral upper lobe infiltrates-currently finishing seventh Levaquin.  Patient continues to have intermittent cough sore throat.  Continues to complain of shortness of breath especially exertion.  Low-grade temperatures.  Review of Systems  Constitutional: Positive for fever and malaise/fatigue. Negative for chills,  diaphoresis and weight loss.  HENT: Negative for nosebleeds and sore throat.   Eyes: Negative for double vision.  Respiratory: Positive for cough and shortness of breath. Negative for hemoptysis and wheezing.   Cardiovascular: Negative for palpitations, orthopnea and leg swelling.  Gastrointestinal: Negative for abdominal pain, blood in stool, constipation, diarrhea, heartburn, melena, nausea and vomiting.  Genitourinary: Negative for dysuria, frequency and urgency.  Musculoskeletal: Positive for joint pain and myalgias. Negative for back pain.  Skin: Negative.  Negative for itching and rash.  Neurological: Negative for dizziness, tingling, focal weakness, weakness and headaches.  Endo/Heme/Allergies: Does not bruise/bleed easily.  Psychiatric/Behavioral: Negative for depression. The patient is not nervous/anxious and does not have insomnia.       PAST MEDICAL HISTORY :  Past Medical History:  Diagnosis Date  . Anxiety   . Asthma   . Breast CA ([place])   . Breast cancer ([place]) 2014   left breast  . Endometriosis 2001  . GERD (gastroesophageal reflux disease)   . Irritable bowel syndrome   . Malignant neoplasm of upper-outer quadrant of female breast ([place]) 01/30/2012   Left breast, Invasive mammary cancer, no special type: T1c,N0,M0; ER 90%, PR 90%, no amplification of HER-2/neu, aromatase inhibitors discontinued September 2015 secondary to joint symptoms, tamoxifen initiated by Dr. Ma Hillock..  . Personal history of chemotherapy   . Personal history of radiation therapy   . Pulmonary hypertension ([place]) 2013  . Sleep apnea 2007  . Sleep apnea 2007    PAST SURGICAL HISTORY :   Past Surgical History:  Procedure Laterality Date  . BREAST BIOPSY Left 2013  neg,   . BREAST BIOPSY Left 01/14   invasive ductal carcinoma  . BREAST BIOPSY Left 12/14   benign, done at Dr. Dwyane Luo office, [place]  . BREAST ENHANCEMENT SURGERY  2014,05/2013  . BREAST LUMPECTOMY Left 2014   invasive  ductal carcinoma  . BREAST SURGERY Left 01/30/2012   Wide excision, sentinel node biopsy  . BREAST SURGERY Left 02/09/2012   Left breast reduction/reconstruction: Nicholaus Bloom, [place].  . BREAST SURGERY Left December 26, 2012   ultrasound-guided biopsy, post surgical change.  . COLONOSCOPY  2012  . DILATION AND CURETTAGE OF UTERUS  2011  . EXPLORATORY LAPAROTOMY  2001  . FACIAL COSMETIC SURGERY  2015  . HERNIA REPAIR  1997  . NISSEN FUNDOPLICATION  7425  . OVARY SURGERY Right 2001  . PORT A CATH REVISION  2014  . REDUCTION MAMMAPLASTY Left 2014  . REDUCTION MAMMAPLASTY Right 2015  . RIGHT HEART CATH  2013  . TONSILLECTOMY  1969    FAMILY HISTORY :   Family History  Problem Relation Age of Onset  . Colon cancer Paternal Grandfather   . Breast cancer Maternal Aunt 40  . Breast cancer Cousin 23  . Breast cancer Cousin 8  . Breast cancer Cousin 50    SOCIAL HISTORY:   Social History   Tobacco Use  . Smoking status: Former Smoker    Packs/day: 0.10    Years: 25.00    Pack years: 2.50    Types: Cigarettes    Quit date: 01/11/2007    Years since quitting: 11.6  . Smokeless tobacco: Never Used  . Tobacco comment: "social smoker" per pt.  Substance Use Topics  . Alcohol use: No  . Drug use: No    ALLERGIES:  is allergic to augmentin [amoxicillin-pot clavulanate].  MEDICATIONS:  Current Outpatient Medications  Medication Sig Dispense Refill  . diclofenac (VOLTAREN) 75 MG EC tablet Take 75 mg by mouth 2 (two) times daily.     Marland Kitchen letrozole (FEMARA) 2.5 MG tablet TAKE 1 TABLET BY MOUTH EVERY DAY 90 tablet 1  . levofloxacin (LEVAQUIN) 500 MG tablet Take 1 tablet (500 mg total) by mouth daily. 10 tablet 0  . montelukast (SINGULAIR) 10 MG tablet TAKE ONE TABLET BY MOUTH AT BEDTIME 30 tablet 0  . pravastatin (PRAVACHOL) 10 MG tablet Take 10 mg by mouth at bedtime.    Marland Kitchen venlafaxine (EFFEXOR) 75 MG tablet TAKE ONE TABLET BY MOUTH TWICE DAILY WITH MEALS 60 tablet 3  .  chlorpheniramine-HYDROcodone (TUSSIONEX PENNKINETIC ER) 10-8 MG/5ML SUER Take 5 mLs by mouth 2 (two) times daily. 70 mL 0  . predniSONE (DELTASONE) 20 MG tablet 3 tablets PO qd x 4 days 12 tablet 0   No current facility-administered medications for this visit.     PHYSICAL EXAMINATION: ECOG PERFORMANCE STATUS: 0 - Asymptomatic  BP 116/71   Pulse 80   Temp (!) 95.3 F (35.2 C)   Resp 20   Wt 206 lb 4.8 oz (93.6 kg)   SpO2 95%   BMI 38.98 kg/m   Filed Weights   08/15/18 1320  Weight: 206 lb 4.8 oz (93.6 kg)    Physical Exam  Constitutional: She is oriented to person, place, and time and well-developed, well-nourished, and in no distress.  HENT:  Head: Normocephalic and atraumatic.  Mouth/Throat: Oropharynx is clear and moist. No oropharyngeal exudate.  Eyes: Pupils are equal, round, and reactive to light.  Neck: Normal range of motion. Neck supple.  Cardiovascular: Normal rate  and regular rhythm.  Pulmonary/Chest: No respiratory distress. She has no wheezes.  Abdominal: Soft. Bowel sounds are normal. She exhibits no distension and no mass. There is no abdominal tenderness. There is no rebound and no guarding.  Musculoskeletal: Normal range of motion.        General: No tenderness or edema.  Neurological: She is alert and oriented to person, place, and time.  Skin: Skin is warm.  Right and left BREAST exam (in the presence of nurse)- no unusual skin changes or dominant masses felt. Surgical scars noted.    Psychiatric: Affect normal.         LABORATORY DATA:  I have reviewed the data as listed    Component Value Date/Time   NA 140 08/15/2018 1247   NA 140 09/25/2013 1425   K 3.7 08/15/2018 1247   K 4.0 09/25/2013 1425   CL 108 08/15/2018 1247   CL 103 09/25/2013 1425   CO2 23 08/15/2018 1247   CO2 29 09/25/2013 1425   GLUCOSE 109 (H) 08/15/2018 1247   GLUCOSE 92 09/25/2013 1425   BUN 17 08/15/2018 1247   BUN 17 09/25/2013 1425   CREATININE 0.83 08/15/2018  1247   CREATININE 0.87 09/25/2013 1425   CALCIUM 9.3 08/15/2018 1247   CALCIUM 10.1 09/25/2013 1425   PROT 6.8 08/15/2018 1247   PROT 7.2 09/25/2013 1425   ALBUMIN 4.0 08/15/2018 1247   ALBUMIN 3.9 09/25/2013 1425   AST 26 08/15/2018 1247   AST 30 09/25/2013 1425   ALT 38 08/15/2018 1247   ALT 55 09/25/2013 1425   ALKPHOS 82 08/15/2018 1247   ALKPHOS 154 (H) 09/25/2013 1425   BILITOT 0.6 08/15/2018 1247   BILITOT 0.4 09/25/2013 1425   GFRNONAA >60 08/15/2018 1247   GFRNONAA >60 09/25/2013 1425   GFRAA >60 08/15/2018 1247   GFRAA >60 09/25/2013 1425    No results found for: SPEP, UPEP  Lab Results  Component Value Date   WBC 5.4 08/15/2018   NEUTROABS 2.4 08/15/2018   HGB 13.7 08/15/2018   HCT 40.4 08/15/2018   MCV 90.8 08/15/2018   PLT 272 08/15/2018      Chemistry      Component Value Date/Time   NA 140 08/15/2018 1247   NA 140 09/25/2013 1425   K 3.7 08/15/2018 1247   K 4.0 09/25/2013 1425   CL 108 08/15/2018 1247   CL 103 09/25/2013 1425   CO2 23 08/15/2018 1247   CO2 29 09/25/2013 1425   BUN 17 08/15/2018 1247   BUN 17 09/25/2013 1425   CREATININE 0.83 08/15/2018 1247   CREATININE 0.87 09/25/2013 1425      Component Value Date/Time   CALCIUM 9.3 08/15/2018 1247   CALCIUM 10.1 09/25/2013 1425   ALKPHOS 82 08/15/2018 1247   ALKPHOS 154 (H) 09/25/2013 1425   AST 26 08/15/2018 1247   AST 30 09/25/2013 1425   ALT 38 08/15/2018 1247   ALT 55 09/25/2013 1425   BILITOT 0.6 08/15/2018 1247   BILITOT 0.4 09/25/2013 1425       RADIOGRAPHIC STUDIES: I have personally reviewed the radiological images as listed and agreed with the findings in the report. No results found.   ASSESSMENT & PLAN:  Malignant neoplasm of overlapping sites of left breast in female, estrogen receptor positive ([place]) # Stage I ER/PR positive HER-2/neu positive breast cancer currently on adjuvant femara.  Stable.  No clinical evidence of recurrence.  Continue letrozole.  #Chronic  cough/low-grade fevers COVID 19  testing x2 negative.  CT scan subtle upper lobe groundglass opacities.  Reviewed imaging with the patient.  Recommend checking COVID antibodies.  Recommend finishing Levaquin total 7 days/1 more day treatment.  We will also discuss with Dr. [name].  # Hot flashes- grade 1; on Effexor stable.  # Osteopenia- 2019- BMD; continue ca+vitD. On zometa   # DISPOSITION: # lab today-we will call with results. # Follow up in 3 months- No labs  Cc: [name].   Addendum: Discussed with Dr. [name], who kindly agrees to evaluate the patient for symptoms.    Orders Placed This Encounter  Procedures  . SARS-CoV-2 Antibody, IgM    Standing Status:   Future    Number of Occurrences:   1    Standing Expiration Date:   08/15/2019    Order Specific Question:   Is this test for diagnosis or screening    Answer:   Diagnosis of ill patient    Order Specific Question:   Symptomatic for COVID-19 as defined by CDC    Answer:   Yes    Order Specific Question:   Date of Symptom Onset    Answer:   06/25/2018    Order Specific Question:   Hospitalized for COVID-19    Answer:   No    Order Specific Question:   Admitted to ICU for COVID-19    Answer:   No    Order Specific Question:   Previously tested for COVID-19    Answer:   Yes    Order Specific Question:   Resident in a congregate (group) care setting    Answer:   No    Order Specific Question:   Employed in healthcare setting    Answer:   No    Order Specific Question:   Pregnant    Answer:   No  . Miscellaneous [facility] test (send-out)    Standing Status:   Future    Number of Occurrences:   1    Standing Expiration Date:   08/15/2019    Order Specific Question:   Test name / description:    Answer:   SARS-CoV-2 Antibody, IgM  TEST:  [id number]   All questions were answered. The patient knows to call the clinic with any problems, questions or concerns.      Cammie Sickle, [place] 08/15/2018 7:40 PM

## 2018-08-16 LAB — MISC LABCORP TEST (SEND OUT)
LabCorp test name: 164068
Labcorp test code: 164068

## 2018-08-16 LAB — CANCER ANTIGEN 27.29: CA 27.29: 11 U/mL (ref 0.0–38.6)

## 2018-09-06 ENCOUNTER — Other Ambulatory Visit: Payer: Self-pay

## 2018-09-06 ENCOUNTER — Encounter
Admission: RE | Admit: 2018-09-06 | Discharge: 2018-09-06 | Disposition: A | Discharge status: Home / Self Care | Payer: Federal, State, Local not specified - PPO | Source: Ambulatory Visit | Attending: Pulmonary Disease | Admitting: Pulmonary Disease

## 2018-09-06 HISTORY — DX: Pneumonia, unspecified organism: J18.9

## 2018-09-06 HISTORY — DX: Cardiac murmur, unspecified: R01.1

## 2018-09-06 HISTORY — DX: Dyspnea, unspecified: R06.00

## 2018-09-06 HISTORY — DX: Headache, unspecified: R51.9

## 2018-09-06 HISTORY — DX: Unspecified osteoarthritis, unspecified site: M19.90

## 2018-09-06 NOTE — Pre-Procedure Instructions (Signed)
Progress Notes - documented in this encounter Isaias Cowman, MD - 11/23/2016 3:15 PM EST Formatting of this note might be different from the original. Established Patient Visit   Chief Complaint: Chief Complaint  Patient presents with  . Follow-up  echo  Date of Service: 11/23/2016 Date of Birth: 1960/11/09 PCP: Glendon Axe, MD  History of Present Illness: Ms. Derhammer is a 58 y.o.female patient who returns for evaluation of chest pain, shortness of breath, and palpitations. The patient reports intermittent episodes of chest discomfort, described as chest tightness, typically associated with shortness of breath with exertion. Towards worsening exertional dyspnea that has been progressive over the last 5 years. He experiences shortness of breath just at rest. She currently takes Singulair with modest clinical improvement. Patient is active but does not do any structured exercise. Underwent ETT 12/03/2014, exercised 9 minutes on a Bruce protocol without chest pain or ECG changes. 2D echocardiogram 11/21/2016 revealed normal left ventricular function, with LVEF of 50%, with mild mitral, tricuspid, and pulmonic insufficiency. Patient reports she is undergone previous right heart catheterizations which revealed evidence for pulmonary hypertension. Patient currently is followed by Dr. [name].  Past Medical and Surgical History  Past Medical History Past Medical History:  Diagnosis Date  . Breast cancer , unspecified (CMS-HCC)  status post lumpectomy and chemotherapy  . Exertional dyspnea  . GERD (gastroesophageal reflux disease)  . Migraines  . Osteoarthritis  . 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   Medications and Allergies  Current  Medications  Current Outpatient Medications  Medication Sig Dispense Refill  . acyclovir (ZOVIRAX) 400 MG tablet Take by mouth.  . calcium carbonate-vitamin D3 (CALTRATE 600+D) 600 mg(1,500mg ) -400 unit tablet Take 1 tablet by mouth 2 (two) times daily with meals.  . clindamycin-benzoyl peroxide (BENZACLIN) 1-5 % topical gel Apply topically 2 (two) times daily. 25 g 0  . diclofenac (VOLTAREN) 75 MG EC tablet TAKE ONE TABLET BY MOUTH TWICE DAILY 180 tablet 3  . docusate (COLACE) 100 MG capsule Take 100 mg by mouth once daily.  . fluticasone (FLOVENT HFA) 110 mcg/actuation inhaler Inhale 1 inhalation into the lungs 2 (two) times daily. 1 Inhaler 12  . letrozole (FEMARA) 2.5 mg tablet Take 2.5 mg by mouth once daily.  Marland Kitchen levalbuterol (XOPENEX HFA) inhaler Inhale 2 inhalations into the lungs every 4 (four) hours as needed for Wheezing. 1 Inhaler 12  . montelukast (SINGULAIR) 10 mg tablet TAKE ONE TABLET BY MOUTH ONCE DAILY 90 tablet 3  . phentermine (ADIPEX-P) 37.5 mg tablet Take 1 tablet (37.5 mg total) by mouth every morning before breakfast. 30 tablet 0  . venlafaxine (EFFEXOR) 75 MG tablet Take 1 tablet by mouth 2 (two) times daily.   No current facility-administered medications for this visit.   Allergies: Amoxicillin-pot clavulanate  Social and Family History  Social History reports that she quit smoking about 11 years ago. Her smoking use included cigarettes. She has never used smokeless tobacco. She reports that she does not drink alcohol or use drugs.  Family History Family History  Problem Relation Age of Onset  . Osteoporosis (Thinning of bones) Mother  . High blood pressure (Hypertension) Father  . Osteoporosis (Thinning of bones) Father  . Colon cancer Father   Review of Systems   Review of Systems:  The patient reports intermittent chest pain, with exertional shortness of breath, without orthopnea, paroxysmal nocturnal dyspnea, pedal edema, with intermittent palpitations,  heart racing, without presyncope, syncope. Review of 12 Systems is negative except as described above.  Physical Examination   Vitals:BP 122/70  Pulse 82  Ht 157.5 cm (5\' 2" )  Wt 84.4 kg (186 lb)  BMI 34.02 kg/m  Ht:157.5 cm (5\' 2" ) Wt:84.4 kg (186 lb) [id number] surface area is 1.92 meters squared. Body mass index is 34.02 kg/m.  HEENT: Pupils equally reactive to light and accomodation  Neck: Supple without thyromegaly, carotid pulses 2+ Lungs: clear to auscultation bilaterally; no wheezes, rales, rhonchi Heart: Regular rate and rhythm. No gallops, murmurs or rub Abdomen: soft nontender, nondistended, with normal bowel sounds Extremities: no cyanosis, clubbing, or edema Peripheral Pulses: 2+ in all extremities, 2+ femoral pulses bilaterally  Assessment   58 y.o. female with  1. OSA (obstructive sleep apnea)  2. Pure hypercholesterolemia  3. H/O syncope  4. Chest pain with low risk for cardiac etiology, unspecified  5. Heart palpitations   58 year old female with multiple complaints including chest pain, exertional dyspnea, and palpitations. 2D echocardiogram reveals normal left ventricular function with mild valvular abnormalities, without echocardiographic evidence for pulmonary hypertension. Patient reportedly has a history of pulmonary hypertension by right heart catheterization, followed by Dr. [name].  Plan   1. Continue current medications 2. 48-hour Holter monitor 3. Follow-up with Dr. [name] 4. Return to clinic after Holter monitor  Orders Placed This Encounter  Procedures  . 48 Hr Holter Monitor  . 24/48 Hr Holter Monitor-Scan/Analysis ([facility])  . 24/48 Hr Holter Monitor-Interpretation   Return in about 2 weeks (around 12/07/2016), or after holter.  Isaias Cowman, MD PhD [facility]    Electronically signed by Isaias Cowman, MD at 11/23/2016 3:39 PM EST

## 2018-09-06 NOTE — Pre-Procedure Instructions (Signed)
Progress Notes - documented in this encounter Erby Pian, MD - 08/20/2018 2:30 PM EDT Formatting of this note might be different from the original.  RECHECK  History of Present Illness: Katrina Holland is a 58 y.o. female  Tessalon pearles, gabapention, steroids, inhalers not helping.  CLINICAL DATA: Persistent cough, shortness of breath I have history of left breast cancer and lumpectomy  EXAM: CT CHEST WITH CONTRAST  TECHNIQUE: Multidetector CT imaging of the chest was performed during intravenous contrast administration.  CONTRAST: 53mL OMNIPAQUE IOHEXOL 300 MG/ML SOLN  COMPARISON: 05/09/2018  FINDINGS: Cardiovascular: No significant vascular findings. Normal heart size. No pericardial effusion.  Mediastinum/Nodes: No enlarged mediastinal, hilar, or axillary lymph nodes. Thyroid gland, trachea, and esophagus demonstrate no significant findings.  Lungs/Pleura: There are scattered ground-glass opacities of the perihilar upper lobes (series 3, image 53, 62). No pleural effusion or pneumothorax.  Upper Abdomen: No acute abnormality. Postoperative findings about the gastric fundus.  Musculoskeletal: No chest wall mass or suspicious bone lesions identified.  IMPRESSION: There are scattered, nonspecific infectious or inflammatory ground-glass opacities of the perihilar upper lobes (series 3, image 53, 62).  Electronically Signed By: Eddie Candle M.D. On: 08/07/2018 09:02  Current Medications:  Current Outpatient Medications  Medication Sig Dispense Refill  . albuterol 90 mcg/actuation inhaler Inhale 2 inhalations into the lungs every 6 (six) hours as needed for Wheezing 1 Inhaler 3  . diclofenac (VOLTAREN) 75 MG EC tablet Take 1 tablet (75 mg total) by mouth 2 (two) times daily 1 tab p.o. twice daily PRN 180 tablet 3  . docusate (COLACE) 100 MG capsule Take 100 mg by mouth once daily.  . fluticasone propion-salmeteroL (ADVAIR DISKUS) 250-50 mcg/dose  diskus inhaler Inhale 1 inhalation into the lungs every 12 (twelve) hours 1 Inhaler 12  . letrozole (FEMARA) 2.5 mg tablet Take 2.5 mg by mouth once daily.  . montelukast (SINGULAIR) 10 mg tablet Take 1 tablet (10 mg total) by mouth once daily 90 tablet 3  . pravastatin (PRAVACHOL) 10 MG tablet Take 1 tablet (10 mg total) by mouth nightly 90 tablet 3  . predniSONE (DELTASONE) 20 MG tablet Take 1 tablet (20 mg total) by mouth once daily 30 tablet 1  . venlafaxine (EFFEXOR-XR) 75 MG XR capsule Take 1 capsule (75 mg total) by mouth once daily Take with Effexor XR 150 mg = 225 mg total dose daily 90 capsule 3   No current facility-administered medications for this visit.   Problem List:  Patient Active Problem List  Diagnosis  . Migraines  . Osteoarthritis  . Breast cancer (CMS-HCC)  . Lumbosacral radiculitis  . Abnormal mammogram  . Abnormal respiratory rate  . Major depression in remission (CMS-HCC)  . Cough  . H/O syncope  . Obesity  . Hot flashes due to tamoxifen  . Primary osteoarthritis of left hip  . Adult BMI 35.0-35.9 kg/sq m  . Osteopenia of multiple sites  . OSA (obstructive sleep apnea)  . Chronic fatigue  . Reactive airway disease, moderate persistent, uncomplicated  . Seasonal allergic rhinitis  . Pure hypercholesterolemia   History: Past Medical History:  Diagnosis Date  . Abnormal cytology 2011  D&C  . Arrhythmia  occasional  . Breast cancer (CMS-HCC)  status post lumpectomy and chemotherapy  . Exertional dyspnea  . GERD (gastroesophageal reflux disease)  nisson fund surgery  . History of abnormal cervical Pap smear 2011  D&C  . Hyperlipidemia 2014  . Major depressive disorder, recurrent episode (CMS-HCC)  . Migraines  .  Obesity birth  chronic diets  . Osteoarthritis 2003  . Osteoporosis, post-menopausal 2005  . 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 Recurrent major depressive disorder, in partial remission (CMS-HCC)  . Sleep apnea 2007   Past  Surgical History:  Procedure Laterality Date  . Benign endometrial tumor excision 2010  . BREAST SURGERY 2014  lumpectomy  . CARDIAC CATHETERIZATION 2013  . COLON SURGERY 2018  polps removed  . HERNIA REPAIR 1997  . Hiatal hernia and stomach wrap ([employer]) 1995.  Marland Kitchen MASTECTOMY PARTIAL / LUMPECTOMY Left 01/2012  . REMOVAL OVARIAN CYST  with RSO  . TONSILLECTOMY 1969  . Uterine surgery  Atypical cells/D&C   Family History  Problem Relation Age of Onset  . Osteoporosis (Thinning of bones) Mother  . Anxiety Mother  . Deep vein thrombosis (DVT or abnormal blood clot formation) Mother  . Depression Mother  . Hyperlipidemia (Elevated cholesterol) Mother  . Osteoarthritis Mother  . High blood pressure (Hypertension) Father  . Osteoporosis (Thinning of bones) Father  . Colon cancer Father  . Asthma Father  . Osteoarthritis Father  . Allergic rhinitis Father  . Alzheimer's disease Maternal Grandmother  . Coronary Artery Disease (Blocked arteries around heart) Maternal Grandmother  . Hyperlipidemia (Elevated cholesterol) Maternal Grandmother  . Hip fracture Maternal Grandmother  . High blood pressure (Hypertension) Maternal Grandmother  . Osteoarthritis Maternal Grandmother  . Osteoporosis (Thinning of bones) Maternal Grandmother  . Colon cancer Paternal Grandfather  . Colon polyps Paternal Grandfather  . Cancer Paternal Grandfather  colon cancer  . Coronary Artery Disease (Blocked arteries around heart) Paternal Grandmother  . Hyperlipidemia (Elevated cholesterol) Paternal Grandmother  . High blood pressure (Hypertension) Paternal Grandmother  . Osteoarthritis Paternal Grandmother  . Stroke Paternal Grandmother  . Alzheimer's disease Paternal Grandmother  . High blood pressure (Hypertension) Sister  . Prostate cancer Maternal Grandfather  . Colon cancer Maternal Grandfather  . Colon polyps Maternal Grandfather  . Emphysema Maternal Grandfather  . Lupus Maternal Aunt    Social History   Socioeconomic History  . Marital status: Divorced  Spouse name: Not on file  . Number of children: Not on file  . Years of education: Not on file  . Highest education level: Not on file  Occupational History  . Not on file  Social Needs  . Financial resource strain: Not on file  . Food insecurity:  Worry: Not on file  Inability: Not on file  . Transportation needs:  Medical: Not on file  Non-medical: Not on file  Tobacco Use  . Smoking status: Former Smoker  Packs/day: 0.50  Years: 20.00  Pack years: 10.00  Types: Cigarettes  Last attempt to quit: 11/13/2005  Years since quitting: 12.7  . Smokeless tobacco: Never Used  . Tobacco comment: previous social smoker on occasion  Substance and Sexual Activity  . Alcohol use: Not Currently  Alcohol/week: 0.0 standard drinks  . Drug use: Never  . Sexual activity: Not Currently  Partners: Male  Other Topics Concern  . Not on file  Social History Narrative  . Not on file   Allergies:  Amoxicillin-pot clavulanate  Review of Systems: As per above. Pretty much unchanged her breathing is improved. No other associated cardiopulmonary, GI, GU, dermatological symptoms today. No focal neurological symptoms or psychological changes.   Physical Exam: BP 117/78  Pulse 92  Temp 36.6 C (97.8 F) (Oral)  Ht 154.9 cm (5\' 1" )  Wt 94.8 kg (209 lb)  SpO2 96%  BMI 39.49 kg/m  94.8 kg (209 lb) 96% General: NAD. Able to speak in complete sentences without cough or dyspnea, over weight, obese HEENT: Normocephalic, nontraumatic. Extraocular movements intact NECK: Supple. No JVD, nodes, thryomegaly CV: RRR no murmurs, gallops, rubs PULM: Normal respiratory effort, Clear to auscultation bilaterally without wheezing or crackles EXTREMITIES: No significant edema, cyanosis or Homans'signs SKIN: Fair turgor. No rashes LYMPHATIC: No nodes NEURO: No gross deficits PSYCH: Appropriate affect, alert, oriented    Impression/Plan: Hx is c/w with asthmatic bronchitis, ex smoker, + pets (doubt contributing), no gerd but cannot rule out silent gerd. No significant post nasal drainage, s/p ENT upper airway eval, told her vocal cords were red. No acei. Gabapentin in the past did not help cough.Did not get allergy tested per patient. C/o coughing, sob and voice change. covid - in the past X 2 S/P - ve antibodies.  Weight loss Albuterol, singulair Omeprazole 20 mg q day Hycodan 1 tsp q 6 prn 115 cc Sed rate, ana, ra, hypersensitivity pneumonitis panel.  Allegist  ? Diagnostic bronch F/u in 4 weeks     Electronically signed by Erby Pian, MD at 08/23/2018 11:56 AM EDT

## 2018-09-06 NOTE — Patient Instructions (Addendum)
Your procedure is scheduled on: 09-12-18 [facility] Report to Same Day Surgery 2nd floor medical mall [facility] Entrance-take elevator on left to 2nd floor.  Check in with surgery information desk.) To find out your arrival time please call [phone] between 1PM - 3PM on 09-11-18 TUESDAY  Remember: Instructions that are not followed completely may result in serious medical risk, up to and including death, or upon the discretion of your surgeon and anesthesiologist your surgery may need to be rescheduled.    _x___ 1. Do not eat food after midnight the night before your procedure. NO GUM OR CANDY AFTER MIDNIGHT. You may drink clear liquids up to 2 hours before you are scheduled to arrive at the hospital for your procedure.  Do not drink clear liquids within 2 hours of your scheduled arrival to the hospital.  Clear liquids include  --Water or Apple juice without pulp  --Clear carbohydrate beverage such as ClearFast or Gatorade  --Black Coffee or Clear Tea (No milk, no creamers, do not add anything to the coffee or Tea   ____Ensure clear carbohydrate drink on the way to the hospital for bariatric patients  ____Ensure clear carbohydrate drink 3 hours before surgery.     __x__ 2. No Alcohol for 24 hours before or after surgery.   __x__3. No Smoking or e-cigarettes for 24 prior to surgery.  Do not use any chewable tobacco products for at least 6 hour prior to surgery   ____  4. Bring all medications with you on the day of surgery if instructed.    __x__ 5. Notify your doctor if there is any change in your medical condition     (cold, fever, infections).    x___6. On the morning of surgery brush your teeth with toothpaste and water.  You may rinse your mouth with mouth wash if you wish.  Do not swallow any toothpaste or mouthwash.   Do not wear jewelry, make-up, hairpins, clips or nail polish.  Do not wear lotions, powders, or perfumes. You may wear deodorant.  Do not shave 48 hours  prior to surgery. Men may shave face and neck.  Do not bring valuables to the hospital.    [facility] is not responsible for any belongings or valuables.               Contacts, dentures or bridgework may not be worn into surgery.  Leave your suitcase in the car. After surgery it may be brought to your room.  For patients admitted to the hospital, discharge time is determined by your treatment team.  _  Patients discharged the day of surgery will not be allowed to drive home.  You will need someone to drive you home and stay with you the night of your procedure.    Please read over the following fact sheets that you were given:   [facility] Preparing for Surgery  _x___ TAKE THE FOLLOWING MEDICATIONS THE MORNING OF SURGERY WITH A SMALL SIP OF WATER. These include:  1. EFFEXOR (VENLAFAXINE)  2. PRILOSEC (OMEPRAZOLE)  3.   4.  5.  6.  ____Fleets enema or Magnesium Citrate as directed.   ____ Use CHG Soap or sage wipes as directed on instruction sheet   _X___ Use inhalers on the day of surgery and bring to hospital day of surgery-USE YOUR ALBUTEROL [place]  ____ Stop Metformin and Janumet 2 days prior to surgery.    ____ Take 1/2 of usual  insulin dose the night before surgery and none on the morning surgery.   _x___ Follow recommendations from Cardiologist, Pulmonologist or PCP regarding stopping Aspirin, Coumadin, Plavix ,Eliquis, Effient, or Pradaxa, and Pletal.  X____Stop Anti-inflammatories such as Advil, Aleve, Ibuprofen, Motrin, Naproxen,DICLOFENAC (VOLTERAN) Naprosyn, Goodies powders or aspirin products NOW-OK to take Tylenol    ____ Stop supplements until after surgery.     ____ Bring C-Pap to the hospital.

## 2018-09-06 NOTE — Pre-Procedure Instructions (Signed)
Echo complete11/13/2018 [place] Component Name Value Ref Range  LV Ejection Fraction (%) 50   Aortic Valve Stenosis Grade none   Aortic Valve Regurgitation Grade none   Aortic Valve Max Velocity (m/s) 1.2 m/sec  Mitral Valve Stenosis Grade none   Mitral Valve Regurgitation Grade mild   Tricuspid Valve Regurgitation Grade trivial   LV End Diastolic Diameter (cm) 4.1 cm  LV End Systolic Diameter (cm) 3.0 cm  LV Septum Wall Thickness (cm) 1.0 cm  LV Posterior Wall Thickness (cm) 0.92 cm  Left Atrium Diameter (cm) 3.7 cm  Result Narrative   [facility] [id number]  [place] #: [id number]  [address] [place], [place] [address] Date: 11/21/2016 01:08 PM Adult Female Age: 58 yrs  ECHOCARDIOGRAM REPORTOutpatient KC::KCWC  STUDY:CHEST WALL TAPE: MD1:PARASCHOS, ALEXANDER ECHO:Yes DOPPLER:YesFILE: BP: 138/80 mmHg  COLOR:YesCONTRAST:NoMACHINE:PhilipsHeight: 62 in  RV BIOPSY:No 3D:No SOUND QLTY:Moderate Weight: 186 lb MEDIUM:None BSA: 1.9 m2  ___________________________________________________________________________________________  HISTORY:DOE REASON:Assess, LV function INDICATION:Exertional dyspnea [R06.09 (ICD-10-CM)],  ___________________________________________________________________________________________ ECHOCARDIOGRAPHIC MEASUREMENTS 2D DIMENSIONS AORTA  ValuesNormal RangeMAIN PAValuesNormal Range Annulus:1.8 cm[2.1 - 2.5]PA Main:nm* [1.5 - 2.1] Aorta Sin:2.5 cm[2.7 - 3.3] RIGHT VENTRICLE ST Junction:nm* [2.3 - 2.9]RV Base:nm* [ < 4.2] Asc.Aorta:nm* [2.3 - 3.1] RV Mid:nm* [ < 3.5]  LEFT VENTRICLERV Length:2.9 cm[ < 8.6] LVIDd:4.1 cm[3.9 - 5.3] INFERIOR VENA CAVA LVIDs:3.0 cmMax. IVC:nm* [ <= 2.1]  FS:27.6 %[> 25]Min. IVC:nm* SWT:1.0 cm[0.5 - 0.9] ------------------ PWT:0.92 cm [0.5 - 0.9] nm* - not measured  LEFT ATRIUM LA Diam:3.7 cm[2.7 - 3.8] LA A4C Area:nm* [ < 20] LA Volume:nm* [22 - 52]  ___________________________________________________________________________________________ ECHOCARDIOGRAPHIC DESCRIPTIONS  AORTIC ROOT Size:Normal Dissection:INDETERM FOR DISSECTION  AORTIC VALVE Leaflets:TricuspidMorphology:Normal Mobility:Fully mobile  LEFT VENTRICLE Size:Normal Anterior:Normal  Contraction:NormalLateral:Normal Closest EF:50% (Estimated)Septal:Normal  LV Masses:No MassesApical:Normal  [id number] Inferior:Normal  Posterior:Normal Dias.FxClass:N/A  MITRAL VALVE Leaflets:Normal Mobility:Fully mobile Morphology:Normal  LEFT ATRIUM Size:NormalLA Masses:No masses  IA Septum:Normal IAS  MAIN PA Size:Normal  PULMONIC VALVE Morphology:Normal Mobility:Fully mobile  RIGHT VENTRICLE   RV Masses:No MassesSize:Normal  Free Wall:NormalContraction:Normal  TRICUSPID VALVE Leaflets:Normal Mobility:Fully mobile Morphology:Normal  RIGHT ATRIUM Size:Normal RA Other:None  RA Mass:No masses  PERICARDIUM  Fluid:No effusion  INFERIOR VENACAVA Size:Normal Normal respiratory collapse   ____________________________________________________________________ DOPPLER ECHO and OTHER SPECIAL PROCEDURES  Aortic:No AR No AS 118.4 cm/sec peak vel 5.6 mmHg peak grad   Mitral:MILD MR No MS 3.2 cm^2 by DOPPLER MV Inflow E Vel=63.8 cm/sec MV Annulus E'Vel=8.2 cm/sec E/E'Ratio=7.8  Tricuspid:TRIVIAL TRNo TS  Pulmonary:MILD PR No PS 96.4 cm/sec peak vel3.7 mmHg peak grad     ___________________________________________________________________________________________ INTERPRETATION NORMAL LEFT VENTRICULAR SYSTOLIC FUNCTION NORMAL RIGHT VENTRICULAR SYSTOLIC FUNCTION MILD VALVULAR REGURGITATION (See above) NO VALVULAR STENOSIS MILD MR, PR TRIVIAL TR EF 50%   ___________________________________________________________________________________________ Electronically signed by: MD Miquel Dunn on 11/22/2016 12:57 PM Performed By: Maurilio Lovely, RDCS Ordering Physician: Isaias Cowman ___________________________________________________________________________________________  Other Result Information  Interface, Text Results In - 11/22/2016 12:58 PM EST                    CARDIOLOGY DEPARTMENT                        [place]  [id number]                  [place] #: [id number]         [place], [place], [place] [address]             Date: 11/21/2016 01:08 PM                                                                 Adult Female Age: 58 yrs                    ECHOCARDIOGRAM REPORT                        Outpatient                                                                 KC::KCWC          STUDY:CHEST WALL                       TAPE:           MD1:  PARASCHOS, ALEXANDER           ECHO:Yes   DOPPLER:Yes                FILE:           BP: 138/80 mmHg          COLOR:Yes  CONTRAST:No      MACHINE:Philips            Height: 62 in      RV BIOPSY:No         3D:No   SOUND QLTY:Moderate           Weight: 186 lb         MEDIUM:None                                             BSA: 1.9 m2  ___________________________________________________________________________________________            HISTORY:DOE             REASON:Assess, LV function         INDICATION:Exertional dyspnea [R06.09 (ICD-10-CM)],  ___________________________________________________________________________________________ ECHOCARDIOGRAPHIC MEASUREMENTS 2D DIMENSIONS AORTA             Values      Normal Range      MAIN PA          Values      Normal Range           Annulus:  1.8 cm    [2.1 - 2.5]                PA Main:  nm*       [  1.5 - 2.1]         Aorta Sin:  2.5 cm    [2.7 - 3.3]       RIGHT VENTRICLE       ST Junction:  nm*       [2.3 - 2.9]                RV Base:  nm*       [ < 4.2]         Asc.Aorta:  nm*       [2.3 - 3.1]                 RV Mid:  nm*       [ < 3.5]  LEFT VENTRICLE                                        RV Length:  2.9 cm    [ < 8.6]             LVIDd:  4.1 cm    [3.9 - 5.3]       INFERIOR VENA CAVA             LVIDs:  3.0 cm                              Max. IVC:  nm*       [ <= 2.1]                FS:  27.6 %    [> 25]                    Min. IVC:  nm*               SWT:  1.0 cm    [0.5 - 0.9]                   ------------------               PWT:  0.92 cm   [0.5 -  0.9]                   nm* - not measured  LEFT ATRIUM           LA Diam:  3.7 cm    [2.7 - 3.8]       LA A4C Area:  nm*       [ < 20]         LA Volume:  nm*       [22 - 52]  ___________________________________________________________________________________________ ECHOCARDIOGRAPHIC DESCRIPTIONS  AORTIC ROOT         Size:Normal   Dissection:INDETERM FOR DISSECTION  AORTIC VALVE     Leaflets:Tricuspid        Morphology:Normal     Mobility:Fully mobile  LEFT VENTRICLE         Size:Normal             Anterior:Normal  Contraction:Normal              Lateral:Normal   Closest EF:50% (Estimated)      Septal:Normal    LV Masses:No Masses            Apical:Normal          [id number]  Inferior:Normal                                Posterior:Normal Willette Brace.FxClass:N/A  MITRAL VALVE     Leaflets:Normal             Mobility:Fully mobile   Morphology:Normal  LEFT ATRIUM         Size:Normal            LA Masses:No masses    IA Septum:Normal IAS  MAIN PA         Size:Normal  PULMONIC VALVE   Morphology:Normal             Mobility:Fully mobile  RIGHT VENTRICLE    RV Masses:No Masses              Size:Normal    Free Wall:Normal          Contraction:Normal  TRICUSPID VALVE     Leaflets:Normal             Mobility:Fully mobile   Morphology:Normal  RIGHT ATRIUM         Size:Normal             RA Other:None      RA Mass:No masses  PERICARDIUM        Fluid:No effusion  INFERIOR VENACAVA         Size:Normal Normal respiratory collapse   ____________________________________________________________________ DOPPLER ECHO and OTHER SPECIAL PROCEDURES    Aortic:No AR                         No AS           118.4 cm/sec peak vel         5.6 mmHg peak grad     Mitral:MILD MR                       No MS                                         3.2 cm^2 by DOPPLER           MV Inflow E Vel=63.8 cm/sec   MV Annulus E'Vel=8.2 cm/sec           E/E'Ratio=7.8  Tricuspid:TRIVIAL  TR                    No TS  Pulmonary:MILD PR                       No PS           96.4 cm/sec peak vel          3.7 mmHg peak grad     ___________________________________________________________________________________________ INTERPRETATION NORMAL LEFT VENTRICULAR SYSTOLIC FUNCTION NORMAL RIGHT VENTRICULAR SYSTOLIC FUNCTION MILD VALVULAR REGURGITATION (See above) NO VALVULAR STENOSIS MILD MR, PR TRIVIAL TR EF 50%   ___________________________________________________________________________________________ Electronically signed by: MD Miquel Dunn on 11/22/2016 12:57 PM             Performed By: Maurilio Lovely, RDCS       Ordering Physician: Isaias Cowman ___________________________________________________________________________________________  Status Results Details   Unavailable

## 2018-09-07 ENCOUNTER — Other Ambulatory Visit: Payer: Self-pay

## 2018-09-07 ENCOUNTER — Other Ambulatory Visit
Admission: RE | Admit: 2018-09-07 | Discharge: 2018-09-07 | Disposition: A | Discharge status: Home / Self Care | Payer: Federal, State, Local not specified - PPO | Source: Ambulatory Visit | Attending: Pulmonary Disease | Admitting: Pulmonary Disease

## 2018-09-07 DIAGNOSIS — Z20828 Contact with and (suspected) exposure to other viral communicable diseases: Secondary | ICD-10-CM | POA: Insufficient documentation

## 2018-09-07 DIAGNOSIS — Z01812 Encounter for preprocedural laboratory examination: Secondary | ICD-10-CM | POA: Diagnosis not present

## 2018-09-07 LAB — SARS CORONAVIRUS 2 (TAT 6-24 HRS): SARS Coronavirus 2: NEGATIVE

## 2018-09-09 ENCOUNTER — Other Ambulatory Visit: Payer: Self-pay | Admitting: Internal Medicine

## 2018-09-11 ENCOUNTER — Ambulatory Visit
Admission: RE | Admit: 2018-09-11 | Discharge: 2018-09-11 | Disposition: A | Discharge status: Home / Self Care | Payer: Federal, State, Local not specified - PPO | Source: Ambulatory Visit | Attending: Oncology | Admitting: Oncology

## 2018-09-11 DIAGNOSIS — C50812 Malignant neoplasm of overlapping sites of left female breast: Secondary | ICD-10-CM | POA: Diagnosis present

## 2018-09-11 DIAGNOSIS — Z1231 Encounter for screening mammogram for malignant neoplasm of breast: Secondary | ICD-10-CM | POA: Diagnosis not present

## 2018-09-11 DIAGNOSIS — Z170 Estrogen receptor positive status [ER+]: Secondary | ICD-10-CM | POA: Diagnosis present

## 2018-09-12 ENCOUNTER — Ambulatory Visit: Payer: Federal, State, Local not specified - PPO | Admitting: Anesthesiology

## 2018-09-12 ENCOUNTER — Encounter
Admission: RE | Disposition: A | Discharge status: Home / Self Care | Payer: Self-pay | Source: Home / Self Care | Attending: Pulmonary Disease

## 2018-09-12 ENCOUNTER — Ambulatory Visit
Admission: RE | Admit: 2018-09-12 | Discharge: 2018-09-12 | Disposition: A | Discharge status: Home / Self Care | Payer: Federal, State, Local not specified - PPO | Attending: Pulmonary Disease | Admitting: Pulmonary Disease

## 2018-09-12 ENCOUNTER — Encounter: Payer: Self-pay | Admitting: *Deleted

## 2018-09-12 DIAGNOSIS — R011 Cardiac murmur, unspecified: Secondary | ICD-10-CM | POA: Insufficient documentation

## 2018-09-12 DIAGNOSIS — F419 Anxiety disorder, unspecified: Secondary | ICD-10-CM | POA: Diagnosis not present

## 2018-09-12 DIAGNOSIS — Z923 Personal history of irradiation: Secondary | ICD-10-CM | POA: Insufficient documentation

## 2018-09-12 DIAGNOSIS — R918 Other nonspecific abnormal finding of lung field: Secondary | ICD-10-CM | POA: Diagnosis not present

## 2018-09-12 DIAGNOSIS — Z79899 Other long term (current) drug therapy: Secondary | ICD-10-CM | POA: Insufficient documentation

## 2018-09-12 DIAGNOSIS — Z853 Personal history of malignant neoplasm of breast: Secondary | ICD-10-CM | POA: Diagnosis not present

## 2018-09-12 DIAGNOSIS — M1990 Unspecified osteoarthritis, unspecified site: Secondary | ICD-10-CM | POA: Insufficient documentation

## 2018-09-12 DIAGNOSIS — K589 Irritable bowel syndrome without diarrhea: Secondary | ICD-10-CM | POA: Diagnosis not present

## 2018-09-12 DIAGNOSIS — Z800 Family history of malignant neoplasm of digestive organs: Secondary | ICD-10-CM | POA: Diagnosis not present

## 2018-09-12 DIAGNOSIS — Z803 Family history of malignant neoplasm of breast: Secondary | ICD-10-CM | POA: Diagnosis not present

## 2018-09-12 DIAGNOSIS — K219 Gastro-esophageal reflux disease without esophagitis: Secondary | ICD-10-CM | POA: Insufficient documentation

## 2018-09-12 DIAGNOSIS — R490 Dysphonia: Secondary | ICD-10-CM | POA: Diagnosis not present

## 2018-09-12 DIAGNOSIS — Z87891 Personal history of nicotine dependence: Secondary | ICD-10-CM | POA: Diagnosis not present

## 2018-09-12 DIAGNOSIS — Z880 Allergy status to penicillin: Secondary | ICD-10-CM | POA: Diagnosis not present

## 2018-09-12 DIAGNOSIS — G43909 Migraine, unspecified, not intractable, without status migrainosus: Secondary | ICD-10-CM | POA: Diagnosis not present

## 2018-09-12 DIAGNOSIS — Z881 Allergy status to other antibiotic agents status: Secondary | ICD-10-CM | POA: Insufficient documentation

## 2018-09-12 DIAGNOSIS — G4730 Sleep apnea, unspecified: Secondary | ICD-10-CM | POA: Insufficient documentation

## 2018-09-12 DIAGNOSIS — R0600 Dyspnea, unspecified: Secondary | ICD-10-CM | POA: Diagnosis not present

## 2018-09-12 DIAGNOSIS — J45909 Unspecified asthma, uncomplicated: Secondary | ICD-10-CM | POA: Insufficient documentation

## 2018-09-12 DIAGNOSIS — I272 Pulmonary hypertension, unspecified: Secondary | ICD-10-CM | POA: Diagnosis not present

## 2018-09-12 DIAGNOSIS — G709 Myoneural disorder, unspecified: Secondary | ICD-10-CM | POA: Insufficient documentation

## 2018-09-12 DIAGNOSIS — R05 Cough: Secondary | ICD-10-CM | POA: Diagnosis not present

## 2018-09-12 DIAGNOSIS — Z9221 Personal history of antineoplastic chemotherapy: Secondary | ICD-10-CM | POA: Diagnosis not present

## 2018-09-12 HISTORY — PX: FLEXIBLE BRONCHOSCOPY: SHX5094

## 2018-09-12 LAB — CBC
HCT: 42.9 % (ref 36.0–46.0)
Hemoglobin: 14.5 g/dL (ref 12.0–15.0)
MCH: 31.4 pg (ref 26.0–34.0)
MCHC: 33.8 g/dL (ref 30.0–36.0)
MCV: 92.9 fL (ref 80.0–100.0)
Platelets: 304 10*3/uL (ref 150–400)
RBC: 4.62 MIL/uL (ref 3.87–5.11)
RDW: 12 % (ref 11.5–15.5)
WBC: 5.2 10*3/uL (ref 4.0–10.5)
nRBC: 0 % (ref 0.0–0.2)

## 2018-09-12 LAB — PROTIME-INR
INR: 0.9 (ref 0.8–1.2)
Prothrombin Time: 12.2 seconds (ref 11.4–15.2)

## 2018-09-12 LAB — APTT: aPTT: 27 seconds (ref 24–36)

## 2018-09-12 SURGERY — BRONCHOSCOPY, FLEXIBLE
Anesthesia: General

## 2018-09-12 MED ORDER — SUGAMMADEX SODIUM 200 MG/2ML IV SOLN
INTRAVENOUS | Status: DC | PRN
  Administered 2018-09-12: 200 mg via INTRAVENOUS

## 2018-09-12 MED ORDER — ROCURONIUM BROMIDE 50 MG/5ML IV SOLN
INTRAVENOUS | Status: AC
  Filled 2018-09-12: qty 1

## 2018-09-12 MED ORDER — SEVOFLURANE IN SOLN
RESPIRATORY_TRACT | Status: AC
  Filled 2018-09-12: qty 250

## 2018-09-12 MED ORDER — MIDAZOLAM HCL 2 MG/2ML IJ SOLN
INTRAMUSCULAR | Status: AC
  Filled 2018-09-12: qty 2

## 2018-09-12 MED ORDER — LIDOCAINE HCL 2 % EX GEL
1.0000 "application " | Freq: Once | CUTANEOUS | Status: DC
  Filled 2018-09-12: qty 4250

## 2018-09-12 MED ORDER — FENTANYL CITRATE (PF) 100 MCG/2ML IJ SOLN
INTRAMUSCULAR | Status: AC
  Filled 2018-09-12: qty 2

## 2018-09-12 MED ORDER — PROPOFOL 10 MG/ML IV BOLUS
INTRAVENOUS | Status: AC
  Filled 2018-09-12: qty 20

## 2018-09-12 MED ORDER — OXYCODONE HCL 5 MG/5ML PO SOLN: 5.0000 mg | Freq: Once | ORAL | Status: DC | PRN

## 2018-09-12 MED ORDER — FENTANYL CITRATE (PF) 100 MCG/2ML IJ SOLN
INTRAMUSCULAR | Status: DC | PRN
  Administered 2018-09-12 (×2): 50 ug via INTRAVENOUS

## 2018-09-12 MED ORDER — MIDAZOLAM HCL 5 MG/5ML IJ SOLN
INTRAMUSCULAR | Status: DC | PRN
  Administered 2018-09-12: 2 mg via INTRAVENOUS

## 2018-09-12 MED ORDER — ONDANSETRON HCL 4 MG/2ML IJ SOLN
INTRAMUSCULAR | Status: DC | PRN
  Administered 2018-09-12: 4 mg via INTRAVENOUS

## 2018-09-12 MED ORDER — OXYCODONE HCL 5 MG PO TABS: 5.0000 mg | ORAL_TABLET | Freq: Once | ORAL | Status: DC | PRN

## 2018-09-12 MED ORDER — LACTATED RINGERS IV SOLN
INTRAVENOUS | Status: DC
  Administered 2018-09-12: 13:00:00 via INTRAVENOUS

## 2018-09-12 MED ORDER — BUTAMBEN-TETRACAINE-BENZOCAINE 2-2-14 % EX AERO
1.0000 | INHALATION_SPRAY | Freq: Once | CUTANEOUS | Status: DC
  Filled 2018-09-12: qty 20

## 2018-09-12 MED ORDER — SUGAMMADEX SODIUM 200 MG/2ML IV SOLN
INTRAVENOUS | Status: AC
  Filled 2018-09-12: qty 2

## 2018-09-12 MED ORDER — LIDOCAINE HCL (CARDIAC) PF 100 MG/5ML IV SOSY
PREFILLED_SYRINGE | INTRAVENOUS | Status: DC | PRN
  Administered 2018-09-12: 60 mg via INTRAVENOUS

## 2018-09-12 MED ORDER — PHENYLEPHRINE HCL (PRESSORS) 10 MG/ML IV SOLN
INTRAVENOUS | Status: DC | PRN
  Administered 2018-09-12: 100 ug via INTRAVENOUS

## 2018-09-12 MED ORDER — ROCURONIUM BROMIDE 100 MG/10ML IV SOLN
INTRAVENOUS | Status: DC | PRN
  Administered 2018-09-12: 5 mg via INTRAVENOUS
  Administered 2018-09-12: 15 mg via INTRAVENOUS

## 2018-09-12 MED ORDER — FENTANYL CITRATE (PF) 100 MCG/2ML IJ SOLN: 25.0000 ug | INTRAMUSCULAR | Status: DC | PRN

## 2018-09-12 MED ORDER — ONDANSETRON HCL 4 MG/2ML IJ SOLN
INTRAMUSCULAR | Status: AC
  Filled 2018-09-12: qty 2

## 2018-09-12 MED ORDER — PROPOFOL 10 MG/ML IV BOLUS
INTRAVENOUS | Status: DC | PRN
  Administered 2018-09-12: 170 mg via INTRAVENOUS

## 2018-09-12 MED ORDER — LIDOCAINE HCL (PF) 1 % IJ SOLN
30.0000 mL | Freq: Once | INTRAMUSCULAR | Status: DC
  Filled 2018-09-12: qty 30

## 2018-09-12 MED ORDER — SUCCINYLCHOLINE CHLORIDE 20 MG/ML IJ SOLN
INTRAMUSCULAR | Status: DC | PRN
  Administered 2018-09-12: 100 mg via INTRAVENOUS

## 2018-09-12 MED ORDER — DEXAMETHASONE SODIUM PHOSPHATE 10 MG/ML IJ SOLN
INTRAMUSCULAR | Status: DC | PRN
  Administered 2018-09-12: 4 mg via INTRAVENOUS

## 2018-09-12 MED ORDER — PHENYLEPHRINE HCL 0.25 % NA SOLN
1.0000 | Freq: Four times a day (QID) | NASAL | Status: DC | PRN
  Filled 2018-09-12: qty 15

## 2018-09-12 NOTE — Procedures (Signed)
FLEXIBLE BRONCHOSCOPY PROCEDURE NOTE    Flexible bronchoscopy was performed on 09/12/18  by : Lanney Gins MD  assistance by : 1)Tammy RT  and 2) Larene Beach RT   Indication for the procedure was :  Pre-procedural H&P. The following assessment was performed on the day of the procedure prior to initiating sedation History:  Chest pain none  Dyspnea none  Hemoptysis none  Cough none Fever none Other pertinent items none  Examination Vital signs -reviewed as per nursing documentation today Cardiac    Murmurs: none   Rubs : none   Gallop: none Lungs Wheezing:  none Rales : noen Rhonchi :mild  Other pertinent findings: voice hoarsenees   Pre-procedural assessment for Procedural Sedation included: Depth of sedation: As per anesthesia team  ASA Classification:  2 Mallampati airway assessment: as per anesthesia    Medication list reviewed: n  The patient's interval history was taken and revealed: no new complaints The pre- procedure physical examination revealed: No new findings Refer to prior clinic note for details.  Informed Consent: Informed consent was obtained from:  patient after explanation of procedure and risks, benefits, as well as alternative procedures available.  Explanation of level of sedation and possible transfusion was also provided.    Procedural Preparation: Time out was performed and patient was identified by name and birthdate and procedure to be performed and side for sampling, if any, was specified. Pt was intubated by anesthesia.  The patient was appropriately draped.  Procedure Findings: Bronchoscope was inserted via ETT  without difficulty.  Posterior oropharynx, epiglottis, arytenoids, false cords and vocal cords were not visualized as these were bypassed by endotracheal tube.   The distal trachea was abnormal in circumference and appearance with solid mucosal lesions which felt to be possibly cartilaginous.    All right and left lobar airways  were visualized to the Sub-segmental level.  Sub- sub segmental carinae were identified in all the distal airways.   Secretions were visible in the following airways and appeared to be phlegm.  The mucosa was : mildly friable  Airways were notable for:        exophytic lesions :none        extrinsic compression in the following distributions: right middle lobe .       Friable mucosa: mildl       Anthrocotic material /pigmentation: none    Pictorial documentation attached: yes please see images under Environmental health practitioner.     Abnormal mucosa at proxima carina was biopsied with surgical forceps X4  Specimens obtained included:    Broncho-alveolar lavage site:RML & Lingular sent for cytology and microbiology                             90 ml volume infused 50 ml volume returned with cellular with debris appearance   The bronchoscopy was terminated due to completion of the planned procedure and the bronchoscope was removed.   Total dosage of Lidocaine was 19m Total fluoroscopy time was 0 minutes  Supplemental oxygen was provided as per anesthesia    Estimated Blood loss: 1cc.  Complications included:  none   Preliminary CXR findings :  n/a  Disposition: Home   Follow up with Dr. ALanney GinsMD in 10 days for result discussion.   FClaudette StaplerMD  KRogue RiverDivision of Pulmonary & Critical Care Medicine

## 2018-09-12 NOTE — Anesthesia Preprocedure Evaluation (Signed)
Anesthesia Evaluation  Patient identified by MRN, date of birth, ID band Patient awake    Reviewed: Allergy & Precautions, H&P , NPO status , Patient's Chart, lab work & pertinent test results  History of Anesthesia Complications Negative for: history of anesthetic complications  Airway Mallampati: III  TM Distance: >3 FB Neck ROM: full    Dental  (+) Chipped, Poor Dentition   Pulmonary shortness of breath and with exertion, asthma , sleep apnea , pneumonia, COPD, former smoker,           Cardiovascular Exercise Tolerance: Good (-) hypertension(-) angina+ DOE  (-) Past MI      Neuro/Psych  Headaches, PSYCHIATRIC DISORDERS  Neuromuscular disease    GI/Hepatic Neg liver ROS, GERD  Medicated and Controlled,  Endo/Other  negative endocrine ROS  Renal/GU      Musculoskeletal  (+) Arthritis ,   Abdominal   Peds  Hematology negative hematology ROS (+)   Anesthesia Other Findings Past Medical History: No date: Anxiety No date: Arthritis No date: Asthma No date: Breast CA ([place]) 2014: Breast cancer ([place])     Comment:  left breast No date: Dyspnea     Comment:  with dry cough x 7 years 2001: Endometriosis No date: GERD (gastroesophageal reflux disease) No date: Headache     Comment:  h/o migraines No date: Heart murmur     Comment:  asymptomatic No date: Irritable bowel syndrome 01/30/2012: Malignant neoplasm of upper-outer quadrant of female  breast [facility])     Comment:  Left breast, Invasive mammary cancer, no special type:               T1c,N0,M0; ER 90%, PR 90%, no amplification of HER-2/neu,              aromatase inhibitors discontinued September 2015               secondary to joint symptoms, tamoxifen initiated by Dr.               Ma Hillock.. No date: Personal history of chemotherapy No date: Personal history of radiation therapy No date: Pneumonia 2013: Pulmonary hypertension ([place]) 2007: Sleep apnea  Comment:  does not use cpap  2007: Sleep apnea  Past Surgical History: 2013: BREAST BIOPSY; Left     Comment:  neg,  01/14: BREAST BIOPSY; Left     Comment:  invasive ductal carcinoma 12/14: BREAST BIOPSY; Left     Comment:  benign, done at Dr. Dwyane Luo office, [place] 2014,05/2013: BREAST ENHANCEMENT SURGERY 2014: BREAST LUMPECTOMY; Left     Comment:  invasive ductal carcinoma 01/30/2012: BREAST SURGERY; Left     Comment:  Wide excision, sentinel node biopsy 02/09/2012: BREAST SURGERY; Left     Comment:  Left breast reduction/reconstruction: Nicholaus Bloom, MD. December 26, 2012: BREAST SURGERY; Left     Comment:  ultrasound-guided biopsy, post surgical change. 2012: COLONOSCOPY 2011: [place] OF UTERUS 2001: EXPLORATORY LAPAROTOMY 2015: FACIAL COSMETIC SURGERY 1997: HERNIA REPAIR 0102: NISSEN FUNDOPLICATION 7253: OVARY SURGERY; Right 2014: PORT A CATH REVISION 2014: REDUCTION MAMMAPLASTY; Left 2015: REDUCTION MAMMAPLASTY; Right 2013: RIGHT HEART CATH 1969: TONSILLECTOMY     Reproductive/Obstetrics negative OB ROS                             Anesthesia Physical Anesthesia Plan  ASA: III  Anesthesia Plan: General ETT   Post-op Pain Management:    Induction: Intravenous  PONV Risk Score  and Plan: Ondansetron, Dexamethasone, Midazolam and Treatment may vary due to age or medical condition  Airway Management Planned: Oral ETT  Additional Equipment:   Intra-op Plan:   Post-operative Plan: Extubation in OR  Informed Consent: I have reviewed the patients History and Physical, chart, labs and discussed the procedure including the risks, benefits and alternatives for the proposed anesthesia with the patient or authorized representative who has indicated his/her understanding and acceptance.     Dental Advisory Given  Plan Discussed with: Anesthesiologist, CRNA and Surgeon  Anesthesia Plan Comments: (Patient consented for  risks of anesthesia including but not limited to:  - adverse reactions to medications - damage to teeth, lips or other oral mucosa - sore throat or hoarseness - Damage to heart, brain, lungs or loss of life  Patient voiced understanding.)        Anesthesia Quick Evaluation

## 2018-09-12 NOTE — Anesthesia Procedure Notes (Signed)
Procedure Name: Intubation Date/Time: 09/12/2018 1:08 PM Performed by: Dionne Bucy, CRNA Pre-anesthesia Checklist: Patient identified, Patient being monitored, Timeout performed, Emergency Drugs available and Suction available Patient Re-evaluated:Patient Re-evaluated prior to induction Oxygen Delivery Method: Circle system utilized Preoxygenation: Pre-oxygenation with 100% oxygen Induction Type: IV induction Ventilation: Mask ventilation without difficulty Laryngoscope Size: 3 and McGraph Grade View: Grade I Tube type: Oral Tube size: 8.5 mm Number of attempts: 1 Airway Equipment and Method: Stylet and Video-laryngoscopy Placement Confirmation: ETT inserted through vocal cords under direct vision,  positive ETCO2 and breath sounds checked- equal and bilateral Secured at: 22 cm Tube secured with: Tape Dental Injury: Teeth and Oropharynx as per pre-operative assessment

## 2018-09-12 NOTE — Anesthesia Postprocedure Evaluation (Signed)
Anesthesia Post Note  Patient: Katrina Holland  Procedure(s) Performed: BRONCHOSCOPY, BRONCHOALVEOLAR LAVAGE, SLEEP APNEA (N/A )  Patient location during evaluation: PACU Anesthesia Type: General Level of consciousness: awake and alert Pain management: pain level controlled Vital Signs Assessment: post-procedure vital signs reviewed and stable Respiratory status: spontaneous breathing, nonlabored ventilation, respiratory function stable and patient connected to nasal cannula oxygen Cardiovascular status: blood pressure returned to baseline and stable Postop Assessment: no apparent nausea or vomiting Anesthetic complications: no     Last Vitals:  Vitals:   09/12/18 1427 09/12/18 1434  BP: 101/89 102/72  Pulse:  83  Resp:  18  Temp:  (!) 36.2 C  SpO2:  97%    Last Pain:  Vitals:   09/12/18 1434  TempSrc: Temporal  PainSc: 0-No pain                 Precious Haws Warden Buffa

## 2018-09-12 NOTE — Transfer of Care (Signed)
Immediate Anesthesia Transfer of Care Note  Patient: Katrina Holland  Procedure(s) Performed: BRONCHOSCOPY, BRONCHOALVEOLAR LAVAGE, SLEEP APNEA (N/A )  Patient Location: PACU  Anesthesia Type:General  Level of Consciousness: awake and patient cooperative  Airway & Oxygen Therapy: Patient Spontanous Breathing and Patient connected to face mask oxygen  Post-op Assessment: Report given to RN and Post -op Vital signs reviewed and stable  Post vital signs: Reviewed and stable  Last Vitals:  Vitals Value Taken Time  BP 119/77 09/12/18 1352  Temp    Pulse 92 09/12/18 1353  Resp 24 09/12/18 1353  SpO2 100 % 09/12/18 1353  Vitals shown include unvalidated device data.  Last Pain:  Vitals:   09/12/18 1205  TempSrc: Tympanic  PainSc: 0-No pain         Complications: No apparent anesthesia complications

## 2018-09-12 NOTE — Op Note (Signed)
[facility] Patient Name: Katrina Holland Procedure Date: 09/12/2018 11:05 AM MRN: [id number] Account #: [id number] Date of Birth: 1960-12-14 Admit Type: Outpatient Age: 59 Room: [facility] PROCEDURE RM 02 Gender: Female Note Status: Finalized Attending MD: Ottie Glazier MD, MD Procedure:         Bronchoscopy Indications:       Bilateral infiltrate Providers:         Ottie Glazier MD, MD Referring MD:       Medicines:         General Anesthesia Complications:     No immediate complications Procedure:         Pre-Anesthesia Assessment:                    - A History and Physical has been performed. Patient meds                     and allergies have been reviewed. The risks and benefits                     of the procedure and the sedation options and risks were                     discussed with the patient's spouse. All questions were                     answered and informed consent was obtained. Patient                     identification and proposed procedure were verified prior                     to the procedure by the physician. Mental Status                     Examination: alert and oriented. Airway Examination:                     normal oropharyngeal airway. CV Examination: normal. ASA                     Grade Assessment: II - A patient with mild systemic                     disease. After reviewing the risks and benefits, the                     patient was deemed in satisfactory condition to undergo                     the procedure. The anesthesia plan was to use deep                     sedation / analgesia. Immediately prior to administration                     of medications, the patient was re-assessed for adequacy                     to receive sedatives. The heart rate, respiratory rate,                     oxygen saturations, blood pressure, adequacy of pulmonary  ventilation, and response to care were monitored      throughout the procedure. The physical status of the                     patient was re-assessed after the procedure.                    After obtaining informed consent, the bronchoscope was                     passed under direct vision. Throughout the procedure, the                     patient's blood pressure, pulse, and oxygen saturations                     were monitored continuously. the Bronchoscope was                     introduced through the mouth, via the endotracheal tube                     (the patient was intubated for the procedure) and advanced                     to the tracheobronchial tree of both lungs. The procedure                     was accomplished without difficulty. Findings:      Endobronchial biopsies of a lesion were performed using a forceps and       sent for histopathology examination. Four samples were obtained. Impression:        - Bilateral infiltrate                    - No specimens collected. Recommendation:    - Await test results. Attending Participation:      I personally performed the entire procedure. Ottie Glazier, MD Ottie Glazier MD, MD 09/12/2018 1:48:28 PM This report has been signed electronically. Number of Addenda: 0 Note Initiated On: 09/12/2018 11:05 AM      [facility]

## 2018-09-12 NOTE — Discharge Instructions (Signed)
AMBULATORY SURGERY  DISCHARGE INSTRUCTIONS   1) The drugs that you were given will stay in your system until tomorrow so for the next 24 hours you should not:  A) Drive an automobile B) Make any legal decisions C) Drink any alcoholic beverage   2) You may resume regular meals tomorrow.  Today it is better to start with liquids and gradually work up to solid foods.  You may eat anything you prefer, but it is better to start with liquids, then soup and crackers, and gradually work up to solid foods.   3) Please notify your doctor immediately if you have any unusual bleeding, trouble breathing, redness and pain at the surgery site, drainage, fever, or pain not relieved by medication. 4)   5) Your post-operative visit with Dr.                                     is: Date:                        Time:    Please call to schedule your post-operative visit.  6) Additional Instructions:      Flexible Bronchoscopy, Care After This sheet gives you information about how to care for yourself after your test. Your doctor may also give you more specific instructions. If you have problems or questions, contact your doctor. Follow these instructions at home: Eating and drinking  Do not eat or drink anything (not even water) for 2 hours after your test, or until your numbing medicine (local anesthetic) wears off.  When your numbness is gone and your cough and gag reflexes have come back, you may: ? Eat only soft foods. ? Slowly drink liquids.  The day after the test, go back to your normal diet. Driving  Do not drive for 24 hours if you were given a medicine to help you relax (sedative).  Do not drive or use heavy machinery while taking prescription pain medicine. General instructions   Take over-the-counter and prescription medicines only as told by your doctor.  Return to your normal activities as told. Ask what activities are safe for you.  Do not use any products that have  nicotine or tobacco in them. This includes cigarettes and e-cigarettes. If you need help quitting, ask your doctor.  Keep all follow-up visits as told by your doctor. This is important. It is very important if you had a tissue sample (biopsy) taken. Get help right away if:  You have shortness of breath that gets worse.  You get light-headed.  You feel like you are going to pass out (faint).  You have chest pain.  You cough up: ? More than a little blood. ? More blood than before. Summary  Do not eat or drink anything (not even water) for 2 hours after your test, or until your numbing medicine wears off.  Do not use cigarettes. Do not use e-cigarettes.  Get help right away if you have chest pain. This information is not intended to replace advice given to you by your health care provider. Make sure you discuss any questions you have with your health care provider. Document Released: 10/24/2008 Document Revised: 12/09/2016 Document Reviewed: 01/15/2016 Elsevier Patient Education  2020 [employer].

## 2018-09-12 NOTE — H&P (Signed)
Pulmonary Medicine          Date: 09/12/2018,   MRN# [id number] Katrina Holland 1960/05/16     Admission                  Current       CHIEF COMPLAINT:   chronic cough   HISTORY OF PRESENT ILLNESS   Pt with chronic cough, respiratory distress thought to be due to LRTI, hx of Breast CA s/p Chemo, hx of asthma.     PAST MEDICAL HISTORY   Past Medical History:  Diagnosis Date  . Anxiety   . Arthritis   . Asthma   . Breast CA ([place])   . Breast cancer ([place]) 2014   left breast  . Dyspnea    with dry cough x 7 years  . Endometriosis 2001  . GERD (gastroesophageal reflux disease)   . Headache    h/o migraines  . Heart murmur    asymptomatic  . Irritable bowel syndrome   . Malignant neoplasm of upper-outer quadrant of female breast ([place]) 01/30/2012   Left breast, Invasive mammary cancer, no special type: T1c,N0,M0; ER 90%, PR 90%, no amplification of HER-2/neu, aromatase inhibitors discontinued September 2015 secondary to joint symptoms, tamoxifen initiated by Dr. Ma Hillock..  . Personal history of chemotherapy   . Personal history of radiation therapy   . Pneumonia   . Pulmonary hypertension ([place]) 2013  . Sleep apnea 2007   does not use cpap   . Sleep apnea 2007     SURGICAL HISTORY   Past Surgical History:  Procedure Laterality Date  . BREAST BIOPSY Left 2013   neg,   . BREAST BIOPSY Left 01/14   invasive ductal carcinoma  . BREAST BIOPSY Left 12/14   benign, done at Dr. Dwyane Luo office, [place]  . BREAST ENHANCEMENT SURGERY  2014,05/2013  . BREAST LUMPECTOMY Left 2014   invasive ductal carcinoma  . BREAST SURGERY Left 01/30/2012   Wide excision, sentinel node biopsy  . BREAST SURGERY Left 02/09/2012   Left breast reduction/reconstruction: Nicholaus Bloom, MD.  . BREAST SURGERY Left December 26, 2012   ultrasound-guided biopsy, post surgical change.  . COLONOSCOPY  2012  . DILATION AND CURETTAGE OF UTERUS  2011  . EXPLORATORY LAPAROTOMY  2001   . FACIAL COSMETIC SURGERY  2015  . HERNIA REPAIR  1997  . NISSEN FUNDOPLICATION  2703  . OVARY SURGERY Right 2001  . PORT A CATH REVISION  2014  . REDUCTION MAMMAPLASTY Left 2014  . REDUCTION MAMMAPLASTY Right 2015  . RIGHT HEART CATH  2013  . TONSILLECTOMY  1969     FAMILY HISTORY   Family History  Problem Relation Age of Onset  . Colon cancer Paternal Grandfather   . Breast cancer Maternal Aunt 2  . Breast cancer Cousin 22  . Breast cancer Cousin 52  . Breast cancer Cousin 57     SOCIAL HISTORY   Social History   Tobacco Use  . Smoking status: Former Smoker    Packs/day: 0.10    Years: 25.00    Pack years: 2.50    Types: Cigarettes    Quit date: 01/11/2007    Years since quitting: 11.6  . Smokeless tobacco: Never Used  . Tobacco comment: "social smoker" per pt.  Substance Use Topics  . Alcohol use: No  . Drug use: No     MEDICATIONS    Home Medication:    Current Medication:  Current  Facility-Administered Medications:  .  butamben-tetracaine-benzocaine (CETACAINE) spray 1 spray, 1 spray, Topical, Once, Nikea Settle, MD .  lactated ringers infusion, , Intravenous, Continuous, Durenda Hurt, MD .  lidocaine (PF) (XYLOCAINE) 1 % injection 30 mL, 30 mL, Infiltration, Once, Kenzleigh Sedam, MD .  lidocaine (XYLOCAINE) 2 % jelly 1 application, 1 application, Topical, Once, Traquan Duarte, MD .  phenylephrine (NEO-SYNEPHRINE) 0.25 % nasal spray 1 spray, 1 spray, Each Nare, Q6H PRN, Ottie Glazier, MD    ALLERGIES   Augmentin [amoxicillin-pot clavulanate]     REVIEW OF SYSTEMS    Review of Systems:  Gen:  Denies  fever, sweats, chills weigh loss  HEENT: Denies blurred vision, double vision, ear pain, eye pain, hearing loss, nose bleeds, sore throat Cardiac:  No dizziness, chest pain or heaviness, chest tightness,edema Resp:   Denies cough or sputum porduction, shortness of breath,wheezing, hemoptysis,  Gi: Denies swallowing  difficulty, stomach pain, nausea or vomiting, diarrhea, constipation, bowel incontinence Gu:  Denies bladder incontinence, burning urine Ext:   Denies Joint pain, stiffness or swelling Skin: Denies  skin rash, easy bruising or bleeding or hives Endoc:  Denies polyuria, polydipsia , polyphagia or weight change Psych:   Denies depression, insomnia or hallucinations   Other:  All other systems negative   VS: BP 110/63   Pulse 77   Temp (!) 96 F (35.6 C) (Tympanic)   Resp 18   SpO2 98%      PHYSICAL EXAM    GENERAL:NAD, no fevers, chills, no weakness no fatigue HEAD: Normocephalic, atraumatic.  EYES: Pupils equal, round, reactive to light. Extraocular muscles intact. No scleral icterus.  MOUTH: Moist mucosal membrane. Dentition intact. No abscess noted.  EAR, NOSE, THROAT: Clear without exudates. No external lesions.  NECK: Supple. No thyromegaly. No nodules. No JVD.  PULMONARY: Mild rhonchi CARDIOVASCULAR: S1 and S2. Regular rate and rhythm. No murmurs, rubs, or gallops. No edema. Pedal pulses 2+ bilaterally.  GASTROINTESTINAL: Soft, nontender, nondistended. No masses. Positive bowel sounds. No hepatosplenomegaly.  MUSCULOSKELETAL: No swelling, clubbing, or edema. Range of motion full in all extremities.  NEUROLOGIC: Cranial nerves II through XII are intact. No gross focal neurological deficits. Sensation intact. Reflexes intact.  SKIN: No ulceration, lesions, rashes, or cyanosis. Skin warm and dry. Turgor intact.  PSYCHIATRIC: Mood, affect within normal limits. The patient is awake, alert and oriented x 3. Insight, judgment intact.       IMAGING    Mm 3d Screen Breast Bilateral  Result Date: 09/11/2018 CLINICAL DATA:  Screening. EXAM: DIGITAL SCREENING BILATERAL MAMMOGRAM WITH TOMO AND CAD COMPARISON:  Previous exam(s). ACR Breast Density Category b: There are scattered areas of fibroglandular density. FINDINGS: There are no findings suspicious for malignancy. Lumpectomy  changes in the superior left breast. Breast reduction changes bilaterally. Images were processed with CAD. IMPRESSION: No mammographic evidence of malignancy. A result letter of this screening mammogram will be mailed directly to the patient. RECOMMENDATION: Screening mammogram in one year. (Code:SM-B-01Y) BI-RADS CATEGORY  2: Benign. Electronically Signed   By: Curlene Dolphin M.D.   On: 09/11/2018 16:16      ASSESSMENT/PLAN   Bilateral ground glass infiltrates with chronic cough  -patient seen by pulmonary and oncology on outpatient  - refractory cough and hoarse voice with failure of outpatient therapy  - plan for bronchoscopy with airway inspection, BAL and biopsies as needed.       Thank you for allowing me to participate in the care of this patient.  Total face  to face encounter time for this patient visit was   Patient/Family are satisfied with care plan and all questions have been answered.  This document was prepared using Dragon voice recognition software and may include unintentional dictation errors.     Ottie Glazier, M.D.  Division of [facility]

## 2018-09-12 NOTE — Anesthesia Post-op Follow-up Note (Signed)
Anesthesia QCDR form completed.        

## 2018-09-13 ENCOUNTER — Encounter: Payer: Self-pay | Admitting: Pulmonary Disease

## 2018-09-13 LAB — CYTOLOGY - NON PAP

## 2018-09-13 LAB — SURGICAL PATHOLOGY

## 2018-09-15 LAB — CULTURE, BAL-QUANTITATIVE W GRAM STAIN
Culture: NO GROWTH
Culture: NO GROWTH
Gram Stain: NONE SEEN

## 2018-10-03 LAB — CULTURE, FUNGUS WITHOUT SMEAR

## 2018-10-07 ENCOUNTER — Other Ambulatory Visit: Payer: Self-pay | Admitting: Internal Medicine

## 2018-10-12 LAB — FUNGAL ORGANISM REFLEX

## 2018-10-12 LAB — FUNGUS CULTURE WITH STAIN

## 2018-10-12 LAB — FUNGUS CULTURE RESULT

## 2018-11-14 ENCOUNTER — Ambulatory Visit: Payer: Federal, State, Local not specified - PPO | Admitting: Internal Medicine

## 2018-11-21 ENCOUNTER — Other Ambulatory Visit: Payer: Self-pay | Admitting: Specialist

## 2018-11-21 DIAGNOSIS — R0602 Shortness of breath: Secondary | ICD-10-CM

## 2018-11-21 DIAGNOSIS — R918 Other nonspecific abnormal finding of lung field: Secondary | ICD-10-CM

## 2018-11-27 ENCOUNTER — Encounter: Payer: Self-pay | Admitting: Internal Medicine

## 2018-11-27 ENCOUNTER — Other Ambulatory Visit: Payer: Self-pay

## 2018-11-27 NOTE — Progress Notes (Signed)
Pre-visit phone assessment notes:  Concern 1: Bronchoscopy, lung lavage, and lung bx done in September. No improvement to SOB, laryngitis, or cough. Repeat CT next week. Pt would like to discuss findings and plan with Dr. Rogue Bussing.   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 BL forearm/hand numbness with elevation.

## 2018-11-28 ENCOUNTER — Other Ambulatory Visit: Payer: Self-pay

## 2018-11-28 ENCOUNTER — Inpatient Hospital Stay: Payer: Federal, State, Local not specified - PPO | Attending: Internal Medicine | Admitting: Internal Medicine

## 2018-11-28 DIAGNOSIS — N951 Menopausal and female climacteric states: Secondary | ICD-10-CM | POA: Diagnosis not present

## 2018-11-28 DIAGNOSIS — Z791 Long term (current) use of non-steroidal anti-inflammatories (NSAID): Secondary | ICD-10-CM | POA: Insufficient documentation

## 2018-11-28 DIAGNOSIS — Z803 Family history of malignant neoplasm of breast: Secondary | ICD-10-CM | POA: Diagnosis not present

## 2018-11-28 DIAGNOSIS — Z79899 Other long term (current) drug therapy: Secondary | ICD-10-CM | POA: Insufficient documentation

## 2018-11-28 DIAGNOSIS — Z9221 Personal history of antineoplastic chemotherapy: Secondary | ICD-10-CM | POA: Insufficient documentation

## 2018-11-28 DIAGNOSIS — Z87891 Personal history of nicotine dependence: Secondary | ICD-10-CM | POA: Insufficient documentation

## 2018-11-28 DIAGNOSIS — Z170 Estrogen receptor positive status [ER+]: Secondary | ICD-10-CM

## 2018-11-28 DIAGNOSIS — M8580 Other specified disorders of bone density and structure, unspecified site: Secondary | ICD-10-CM | POA: Insufficient documentation

## 2018-11-28 DIAGNOSIS — Z79811 Long term (current) use of aromatase inhibitors: Secondary | ICD-10-CM | POA: Diagnosis not present

## 2018-11-28 DIAGNOSIS — Z923 Personal history of irradiation: Secondary | ICD-10-CM | POA: Insufficient documentation

## 2018-11-28 DIAGNOSIS — C50812 Malignant neoplasm of overlapping sites of left female breast: Secondary | ICD-10-CM

## 2018-11-28 DIAGNOSIS — Z800 Family history of malignant neoplasm of digestive organs: Secondary | ICD-10-CM | POA: Insufficient documentation

## 2018-11-28 NOTE — Progress Notes (Signed)
[place] OFFICE PROGRESS NOTE  Patient Care Team: Kirk Ruths, MD as PCP - General (Internal Medicine) Bary Castilla Forest Gleason, MD (General Surgery) Dear, [name], MD (Inactive) (Family Medicine)  Cancer Staging No matching staging information was found for the patient.   Oncology History Overview Note  # JAN 2014- LEFT BREAST CA IDC; pT1c (1.2cm) pN0 [Stage I] ER/PR > 90%; her 2 Neu POS; AC x4- Taxol-Herceptin; Intol to AI; March 2015- Started TAM [stopped- Sep 2017]; OCT 2017- Post menopausal [estradiol/FSH]; NOV 1st 2017- Start ARIMIDEX  # DEC 2016- Start Effexor   # Right oopherectomy; Left intact ovary-intact [last periods late 90s]  DIAGNOSIS:Breast cancer  STAGE:  I       ;GOALS: cure  CURRENT/MOST RECENT THERAPY: anastrazole    Malignant neoplasm of overlapping sites of left breast in female, estrogen receptor positive ([place])      INTERVAL HISTORY:  Katrina Holland 58 y.o.  female pleasant patient above history of stage I breast cancer ER PR positive currently on adjuvant Arimidex is here for follow-up.  Patient minimal extensive work-up for ongoing cough hoarseness of voice-including ENT evaluation; also had bronchoscopy.  No malignancy was identified.  Patient continues to have intermittent cough shortness of breath on exertion.  She is recommended PPI.  Complains of joint pains muscle pain.  Also complains of tingling and numbness in the left upper extremity.  Review of Systems  Constitutional: Positive for fever and malaise/fatigue. Negative for chills, diaphoresis and weight loss.  HENT: Negative for nosebleeds and sore throat.   Eyes: Negative for double vision.  Respiratory: Positive for cough and shortness of breath. Negative for hemoptysis and wheezing.   Cardiovascular: Negative for palpitations, orthopnea and leg swelling.  Gastrointestinal: Negative for abdominal pain, blood in stool, constipation, diarrhea, heartburn, melena, nausea  and vomiting.  Genitourinary: Negative for dysuria, frequency and urgency.  Musculoskeletal: Positive for joint pain and myalgias. Negative for back pain.  Skin: Negative.  Negative for itching and rash.  Neurological: Negative for dizziness, tingling, focal weakness, weakness and headaches.  Endo/Heme/Allergies: Does not bruise/bleed easily.  Psychiatric/Behavioral: Negative for depression. The patient is not nervous/anxious and does not have insomnia.       PAST MEDICAL HISTORY :  Past Medical History:  Diagnosis Date  . Anxiety   . Arthritis   . Asthma   . Breast CA ([place])   . Breast cancer ([place]) 2014   left breast  . Dyspnea    with dry cough x 7 years  . Endometriosis 2001  . GERD (gastroesophageal reflux disease)   . Headache    h/o migraines  . Heart murmur    asymptomatic  . Irritable bowel syndrome   . Malignant neoplasm of upper-outer quadrant of female breast ([place]) 01/30/2012   Left breast, Invasive mammary cancer, no special type: T1c,N0,M0; ER 90%, PR 90%, no amplification of HER-2/neu, aromatase inhibitors discontinued September 2015 secondary to joint symptoms, tamoxifen initiated by Dr. Ma Hillock..  . Personal history of chemotherapy   . Personal history of radiation therapy   . Pneumonia   . Pulmonary hypertension ([place]) 2013  . Sleep apnea 2007   does not use cpap   . Sleep apnea 2007    PAST SURGICAL HISTORY :   Past Surgical History:  Procedure Laterality Date  . BREAST BIOPSY Left 2013   neg,   . BREAST BIOPSY Left 01/14   invasive ductal carcinoma  . BREAST BIOPSY Left 12/14   benign,  done at Dr. Dwyane Luo office, [place]  . BREAST ENHANCEMENT SURGERY  2014,05/2013  . BREAST LUMPECTOMY Left 2014   invasive ductal carcinoma  . BREAST SURGERY Left 01/30/2012   Wide excision, sentinel node biopsy  . BREAST SURGERY Left 02/09/2012   Left breast reduction/reconstruction: Nicholaus Bloom, MD.  . BREAST SURGERY Left December 26, 2012   ultrasound-guided  biopsy, post surgical change.  . COLONOSCOPY  2012  . DILATION AND CURETTAGE OF UTERUS  2011  . EXPLORATORY LAPAROTOMY  2001  . FACIAL COSMETIC SURGERY  2015  . FLEXIBLE BRONCHOSCOPY N/A 09/12/2018   Procedure: BRONCHOSCOPY, BRONCHOALVEOLAR LAVAGE, SLEEP APNEA;  Surgeon: Ottie Glazier, MD;  Location: [facility] ORS;  Service: Thoracic;  Laterality: N/A;  . [place]  . NISSEN FUNDOPLICATION  [id number]  . OVARY SURGERY Right 2001  . PORT A CATH REVISION  2014  . REDUCTION MAMMAPLASTY Left 2014  . REDUCTION MAMMAPLASTY Right 2015  . RIGHT HEART CATH  2013  . TONSILLECTOMY  1969    FAMILY HISTORY :   Family History  Problem Relation Age of Onset  . Colon cancer Paternal Grandfather   . Breast cancer Maternal Aunt 58  . Breast cancer Cousin 49  . Breast cancer Cousin 67  . Breast cancer Cousin 59    SOCIAL HISTORY:   Social History   Tobacco Use  . Smoking status: Former Smoker    Packs/day: 0.10    Years: 25.00    Pack years: 2.50    Types: Cigarettes    Quit date: 01/11/2007    Years since quitting: 11.9  . Smokeless tobacco: Never Used  . Tobacco comment: "social smoker" per pt.  Substance Use Topics  . Alcohol use: No  . Drug use: No    ALLERGIES:  is allergic to augmentin [amoxicillin-pot clavulanate].  MEDICATIONS:  Current Outpatient Medications  Medication Sig Dispense Refill  . albuterol (VENTOLIN HFA) 108 (90 Base) MCG/ACT inhaler Inhale 1-2 puffs into the lungs every 6 (six) hours as needed for wheezing or shortness of breath.    . diclofenac (VOLTAREN) 75 MG EC tablet Take 75 mg by mouth 2 (two) times daily.     Marland Kitchen letrozole (FEMARA) 2.5 MG tablet Take 1 tablet by mouth once daily 90 tablet 0  . montelukast (SINGULAIR) 10 MG tablet TAKE ONE TABLET BY MOUTH AT BEDTIME 30 tablet 0  . pravastatin (PRAVACHOL) 10 MG tablet Take 10 mg by mouth at bedtime.    Marland Kitchen venlafaxine (EFFEXOR) 75 MG tablet TAKE ONE TABLET BY MOUTH TWICE DAILY WITH MEALS (Patient taking  differently: Take 75 mg by mouth every morning. ) 60 tablet 3   No current facility-administered medications for this visit.     PHYSICAL EXAMINATION: ECOG PERFORMANCE STATUS: 0 - Asymptomatic  BP 117/72   Pulse 64   Temp (!) 97.1 F (36.2 C) (Tympanic)   Resp (!) 22   Ht _0  (1.549 m)   Wt 208 lb (94.3 kg)   BMI 39.30 kg/m   Filed Weights   11/28/18 0959  Weight: 208 lb (94.3 kg)    Physical Exam  Constitutional: She is oriented to person, place, and time and well-developed, well-nourished, and in no distress.  HENT:  Head: Normocephalic and atraumatic.  Mouth/Throat: Oropharynx is clear and moist. No oropharyngeal exudate.  Eyes: Pupils are equal, round, and reactive to light.  Neck: Normal range of motion. Neck supple.  Cardiovascular: Normal rate and regular rhythm.  Pulmonary/Chest: No respiratory distress.  She has no wheezes.  Abdominal: Soft. Bowel sounds are normal. She exhibits no distension and no mass. There is no abdominal tenderness. There is no rebound and no guarding.  Musculoskeletal: Normal range of motion.        General: No tenderness or edema.  Neurological: She is alert and oriented to person, place, and time.  Skin: Skin is warm.  Right and left BREAST exam (in the presence of nurse)- no unusual skin changes or dominant masses felt. Surgical scars noted.    Psychiatric: Affect normal.    LABORATORY DATA:  I have reviewed the data as listed    Component Value Date/Time   NA 140 08/15/2018 1247   NA 140 09/25/2013 1425   K 3.7 08/15/2018 1247   K 4.0 09/25/2013 1425   CL 108 08/15/2018 1247   CL 103 09/25/2013 1425   CO2 23 08/15/2018 1247   CO2 29 09/25/2013 1425   GLUCOSE 109 (H) 08/15/2018 1247   GLUCOSE 92 09/25/2013 1425   BUN 17 08/15/2018 1247   BUN 17 09/25/2013 1425   CREATININE 0.83 08/15/2018 1247   CREATININE 0.87 09/25/2013 1425   CALCIUM 9.3 08/15/2018 1247   CALCIUM 10.1 09/25/2013 1425   PROT 6.8 08/15/2018 1247    PROT 7.2 09/25/2013 1425   ALBUMIN 4.0 08/15/2018 1247   ALBUMIN 3.9 09/25/2013 1425   AST 26 08/15/2018 1247   AST 30 09/25/2013 1425   ALT 38 08/15/2018 1247   ALT 55 09/25/2013 1425   ALKPHOS 82 08/15/2018 1247   ALKPHOS 154 (H) 09/25/2013 1425   BILITOT 0.6 08/15/2018 1247   BILITOT 0.4 09/25/2013 1425   GFRNONAA >60 08/15/2018 1247   GFRNONAA >60 09/25/2013 1425   GFRAA >60 08/15/2018 1247   GFRAA >60 09/25/2013 1425    No results found for: SPEP, UPEP  Lab Results  Component Value Date   WBC 5.2 09/12/2018   NEUTROABS 2.4 08/15/2018   HGB 14.5 09/12/2018   HCT 42.9 09/12/2018   MCV 92.9 09/12/2018   PLT 304 09/12/2018      Chemistry      Component Value Date/Time   NA 140 08/15/2018 1247   NA 140 09/25/2013 1425   K 3.7 08/15/2018 1247   K 4.0 09/25/2013 1425   CL 108 08/15/2018 1247   CL 103 09/25/2013 1425   CO2 23 08/15/2018 1247   CO2 29 09/25/2013 1425   BUN 17 08/15/2018 1247   BUN 17 09/25/2013 1425   CREATININE 0.83 08/15/2018 1247   CREATININE 0.87 09/25/2013 1425      Component Value Date/Time   CALCIUM 9.3 08/15/2018 1247   CALCIUM 10.1 09/25/2013 1425   ALKPHOS 82 08/15/2018 1247   ALKPHOS 154 (H) 09/25/2013 1425   AST 26 08/15/2018 1247   AST 30 09/25/2013 1425   ALT 38 08/15/2018 1247   ALT 55 09/25/2013 1425   BILITOT 0.6 08/15/2018 1247   BILITOT 0.4 09/25/2013 1425       RADIOGRAPHIC STUDIES: I have personally reviewed the radiological images as listed and agreed with the findings in the report. No results found.   ASSESSMENT & PLAN:  Malignant neoplasm of overlapping sites of left breast in female, estrogen receptor positive ([place]) # Stage I ER/PR positive HER-2/neu positive breast cancer currently on adjuvant femara.  Stable.  No clinical evidence of recurrence.  HOLD letrozole x 3 months. ? Respiratory issues.   #Chronic cough/dyspnea/Layrngeal -COVID 19 testing x2 negative.July 2020-  CT scan subtle  upper lobe groundglass  opacities. S/p bronchoscopy- benign/ s/p PPI.  Continue follow-up with pulmonary as planned.  # Hot flashes- grade 1; on Effexor weaning off.   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 On zometa   # DISPOSITION: # Follow up in 3 months;MD;  cbc/bmp; zometa-Dr.B  Cc: [name].    Orders Placed This Encounter  Procedures  . CBC with Differential    Standing Status:   Future    Standing Expiration Date:   11/28/2019  . Basic metabolic panel    Standing Status:   Future    Standing Expiration Date:   11/28/2019   All questions were answered. The patient knows to call the clinic with any problems, questions or concerns.      Cammie Sickle, MD 12/11/2018 8:08 AM

## 2018-11-28 NOTE — Assessment & Plan Note (Addendum)
#  Stage I ER/PR positive HER-2/neu positive breast cancer currently on adjuvant femara.  Stable.  No clinical evidence of recurrence.  HOLD letrozole x 3 months. ? Respiratory issues.   #Chronic cough/dyspnea/Layrngeal -COVID 19 testing x2 negative.July 2020-  CT scan subtle upper lobe groundglass opacities. S/p bronchoscopy- benign/ s/p PPI.  Continue follow-up with pulmonary as planned.  # Hot flashes- grade 1; on Effexor weaning off.   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 On zometa   # DISPOSITION: # Follow up in 3 months;MD;  cbc/bmp; zometa-Dr.B  Cc: [name].

## 2018-12-05 ENCOUNTER — Other Ambulatory Visit: Payer: Self-pay

## 2018-12-05 ENCOUNTER — Ambulatory Visit
Admission: RE | Admit: 2018-12-05 | Discharge: 2018-12-05 | Disposition: A | Discharge status: Home / Self Care | Payer: Federal, State, Local not specified - PPO | Source: Ambulatory Visit | Attending: Specialist | Admitting: Specialist

## 2018-12-05 DIAGNOSIS — R918 Other nonspecific abnormal finding of lung field: Secondary | ICD-10-CM | POA: Diagnosis present

## 2018-12-05 DIAGNOSIS — R0602 Shortness of breath: Secondary | ICD-10-CM | POA: Diagnosis not present

## 2019-02-05 ENCOUNTER — Telehealth: Payer: Self-pay | Admitting: Internal Medicine

## 2019-02-05 NOTE — Telephone Encounter (Signed)
Patient phoned and asked if her appts could be moved from 02-27-19 to 02-20-19. Due to one of her appts being a treatment appt, Probation officer messaged patient's treatment team and asked for assistance with this. Team to phone patient back about schedule.

## 2019-02-19 ENCOUNTER — Other Ambulatory Visit: Payer: Self-pay

## 2019-02-20 ENCOUNTER — Inpatient Hospital Stay: Payer: Federal, State, Local not specified - PPO

## 2019-02-20 ENCOUNTER — Inpatient Hospital Stay (HOSPITAL_BASED_OUTPATIENT_CLINIC_OR_DEPARTMENT_OTHER): Payer: Federal, State, Local not specified - PPO | Admitting: Internal Medicine

## 2019-02-20 ENCOUNTER — Other Ambulatory Visit: Payer: Self-pay

## 2019-02-20 ENCOUNTER — Inpatient Hospital Stay: Payer: Federal, State, Local not specified - PPO | Attending: Internal Medicine

## 2019-02-20 DIAGNOSIS — Z803 Family history of malignant neoplasm of breast: Secondary | ICD-10-CM | POA: Diagnosis not present

## 2019-02-20 DIAGNOSIS — Z87891 Personal history of nicotine dependence: Secondary | ICD-10-CM | POA: Insufficient documentation

## 2019-02-20 DIAGNOSIS — Z9221 Personal history of antineoplastic chemotherapy: Secondary | ICD-10-CM | POA: Insufficient documentation

## 2019-02-20 DIAGNOSIS — K589 Irritable bowel syndrome without diarrhea: Secondary | ICD-10-CM | POA: Diagnosis not present

## 2019-02-20 DIAGNOSIS — Z170 Estrogen receptor positive status [ER+]: Secondary | ICD-10-CM | POA: Diagnosis not present

## 2019-02-20 DIAGNOSIS — R05 Cough: Secondary | ICD-10-CM | POA: Diagnosis not present

## 2019-02-20 DIAGNOSIS — J45909 Unspecified asthma, uncomplicated: Secondary | ICD-10-CM | POA: Insufficient documentation

## 2019-02-20 DIAGNOSIS — Z79899 Other long term (current) drug therapy: Secondary | ICD-10-CM | POA: Insufficient documentation

## 2019-02-20 DIAGNOSIS — M8580 Other specified disorders of bone density and structure, unspecified site: Secondary | ICD-10-CM | POA: Insufficient documentation

## 2019-02-20 DIAGNOSIS — R490 Dysphonia: Secondary | ICD-10-CM | POA: Diagnosis not present

## 2019-02-20 DIAGNOSIS — C50812 Malignant neoplasm of overlapping sites of left female breast: Secondary | ICD-10-CM

## 2019-02-20 DIAGNOSIS — Z923 Personal history of irradiation: Secondary | ICD-10-CM | POA: Diagnosis not present

## 2019-02-20 DIAGNOSIS — Z791 Long term (current) use of non-steroidal anti-inflammatories (NSAID): Secondary | ICD-10-CM | POA: Diagnosis not present

## 2019-02-20 DIAGNOSIS — Z800 Family history of malignant neoplasm of digestive organs: Secondary | ICD-10-CM | POA: Diagnosis not present

## 2019-02-20 DIAGNOSIS — F419 Anxiety disorder, unspecified: Secondary | ICD-10-CM | POA: Insufficient documentation

## 2019-02-20 DIAGNOSIS — Z79811 Long term (current) use of aromatase inhibitors: Secondary | ICD-10-CM | POA: Insufficient documentation

## 2019-02-20 DIAGNOSIS — N951 Menopausal and female climacteric states: Secondary | ICD-10-CM | POA: Insufficient documentation

## 2019-02-20 DIAGNOSIS — R232 Flushing: Secondary | ICD-10-CM | POA: Diagnosis not present

## 2019-02-20 LAB — CBC WITH DIFFERENTIAL/PLATELET
Abs Immature Granulocytes: 0.01 10*3/uL (ref 0.00–0.07)
Basophils Absolute: 0 10*3/uL (ref 0.0–0.1)
Basophils Relative: 1 %
Eosinophils Absolute: 0.1 10*3/uL (ref 0.0–0.5)
Eosinophils Relative: 1 %
HCT: 40.6 % (ref 36.0–46.0)
Hemoglobin: 13.3 g/dL (ref 12.0–15.0)
Immature Granulocytes: 0 %
Lymphocytes Relative: 41 %
Lymphs Abs: 2.2 10*3/uL (ref 0.7–4.0)
MCH: 30.6 pg (ref 26.0–34.0)
MCHC: 32.8 g/dL (ref 30.0–36.0)
MCV: 93.3 fL (ref 80.0–100.0)
Monocytes Absolute: 0.4 10*3/uL (ref 0.1–1.0)
Monocytes Relative: 7 %
Neutro Abs: 2.6 10*3/uL (ref 1.7–7.7)
Neutrophils Relative %: 50 %
Platelets: 259 10*3/uL (ref 150–400)
RBC: 4.35 MIL/uL (ref 3.87–5.11)
RDW: 11.9 % (ref 11.5–15.5)
WBC: 5.3 10*3/uL (ref 4.0–10.5)
nRBC: 0 % (ref 0.0–0.2)

## 2019-02-20 LAB — BASIC METABOLIC PANEL
Anion gap: 7 (ref 5–15)
BUN: 14 mg/dL (ref 6–20)
CO2: 23 mmol/L (ref 22–32)
Calcium: 9 mg/dL (ref 8.9–10.3)
Chloride: 108 mmol/L (ref 98–111)
Creatinine, Ser: 0.59 mg/dL (ref 0.44–1.00)
GFR calc Af Amer: >60 mL/min (ref >60)
GFR calc non Af Amer: >60 mL/min (ref >60)
Glucose, Bld: 108 mg/dL — ABNORMAL HIGH (ref 70–99)
Potassium: 3.9 mmol/L (ref 3.5–5.1)
Sodium: 138 mmol/L (ref 135–145)

## 2019-02-20 MED ORDER — SODIUM CHLORIDE 0.9 % IV SOLN
Freq: Once | INTRAVENOUS | Status: AC
  Filled 2019-02-20: qty 250

## 2019-02-20 MED ORDER — ALTEPLASE 2 MG IJ SOLR
2.0000 mg | Freq: Once | INTRAMUSCULAR | Status: DC | PRN
  Filled 2019-02-20: qty 2

## 2019-02-20 MED ORDER — ZOLEDRONIC ACID 4 MG/100ML IV SOLN
4.0000 mg | Freq: Once | INTRAVENOUS | Status: AC
  Administered 2019-02-20: 4 mg via INTRAVENOUS
  Filled 2019-02-20: qty 100

## 2019-02-20 MED ORDER — HEPARIN SOD (PORK) LOCK FLUSH 100 UNIT/ML IV SOLN
250.0000 [IU] | Freq: Once | INTRAVENOUS | Status: DC | PRN
  Filled 2019-02-20: qty 5

## 2019-02-20 MED ORDER — HEPARIN SOD (PORK) LOCK FLUSH 100 UNIT/ML IV SOLN
500.0000 [IU] | Freq: Once | INTRAVENOUS | Status: DC | PRN
  Filled 2019-02-20: qty 5

## 2019-02-20 MED ORDER — SODIUM CHLORIDE 0.9% FLUSH
3.0000 mL | Freq: Once | INTRAVENOUS | Status: DC | PRN
  Filled 2019-02-20: qty 3

## 2019-02-20 MED ORDER — ZOLEDRONIC ACID 4 MG/100ML IV SOLN: 4.0000 mg | Freq: Once | INTRAVENOUS | Status: DC

## 2019-02-20 MED ORDER — SODIUM CHLORIDE 0.9% FLUSH
10.0000 mL | Freq: Once | INTRAVENOUS | Status: DC | PRN
  Filled 2019-02-20: qty 10

## 2019-02-20 NOTE — Assessment & Plan Note (Addendum)
#  Stage I ER/PR positive HER-2/neu positive breast cancer currently on adjuvant femara. Stable.  No clinical evidence of recurrence.    #Adjuvant Femara has been held for the last 3 months because of respiratory issues [see below].  I think is reasonable to start letrozole as patient is clinically stable.  # Chronic cough/dyspnea/Layrngeal [Dr. Fleming/[place]que[place]]-extensive work-up including bronchoscopy negative.  Repeat CT scan November 2020-improving.  Overall clinically stable.  # Hot flashes- grade 1; stable.  # Osteopenia- 2019- BMD; continue ca+vitD. On zometa x every 6 months.  # DISPOSITION: # Zometa today # Follow up in 6 months; MD;  cbc/bmp; zometa-Dr.B  Cc: [name].

## 2019-02-20 NOTE — Progress Notes (Signed)
[place] OFFICE PROGRESS NOTE  Patient Care Team: Kirk Ruths, MD as PCP - General (Internal Medicine) Bary Castilla Forest Gleason, MD (General Surgery) Dear, [name], MD (Inactive) (Family Medicine)  Cancer Staging No matching staging information was found for the patient.   Oncology History Overview Note  # JAN 2014- LEFT BREAST CA IDC; pT1c (1.2cm) pN0 [Stage I] ER/PR > 90%; her 2 Neu POS; AC x4- Taxol-Herceptin; Intol to AI; March 2015- Started TAM [stopped- Sep 2017]; OCT 2017- Post menopausal [estradiol/FSH]; NOV 1st 2017- Start ARIMIDEX  # DEC 2016- Start Effexor   # Right oopherectomy; Left intact ovary-intact [last periods late 90s]  DIAGNOSIS:Breast cancer  STAGE:  I       ;GOALS: cure  CURRENT/MOST RECENT THERAPY: anastrazole    Malignant neoplasm of overlapping sites of left breast in female, estrogen receptor positive ([place])      INTERVAL HISTORY:  Katrina Holland 59 y.o.  female pleasant patient above history of stage I breast cancer ER PR positive currently on adjuvant Arimidex is here for follow-up.  Arimidex was held approximately 3 months ago because of respiratory issues.  Patient continues to have chronic hoarseness of voice chronic mild cough.-Patient had extensive ENT/pulmonary evaluation.  Nodes of malignancy.  Denies any bone pain.  Denies any headaches.   Review of Systems  Constitutional: Positive for fever and malaise/fatigue. Negative for chills, diaphoresis and weight loss.  HENT: Negative for nosebleeds and sore throat.   Eyes: Negative for double vision.  Respiratory: Positive for cough and shortness of breath. Negative for hemoptysis and wheezing.   Cardiovascular: Negative for palpitations, orthopnea and leg swelling.  Gastrointestinal: Negative for abdominal pain, blood in stool, constipation, diarrhea, heartburn, melena, nausea and vomiting.  Genitourinary: Negative for dysuria, frequency and urgency.  Musculoskeletal:  Positive for joint pain and myalgias. Negative for back pain.  Skin: Negative.  Negative for itching and rash.  Neurological: Negative for dizziness, tingling, focal weakness, weakness and headaches.  Endo/Heme/Allergies: Does not bruise/bleed easily.  Psychiatric/Behavioral: Negative for depression. The patient is not nervous/anxious and does not have insomnia.       PAST MEDICAL HISTORY :  Past Medical History:  Diagnosis Date  . Anxiety   . Arthritis   . Asthma   . Breast CA ([place])   . Breast cancer ([place]) 2014   left breast  . Dyspnea    with dry cough x 7 years  . Endometriosis 2001  . GERD (gastroesophageal reflux disease)   . Headache    h/o migraines  . Heart murmur    asymptomatic  . Irritable bowel syndrome   . Malignant neoplasm of upper-outer quadrant of female breast ([place]) 01/30/2012   Left breast, Invasive mammary cancer, no special type: T1c,N0,M0; ER 90%, PR 90%, no amplification of HER-2/neu, aromatase inhibitors discontinued September 2015 secondary to joint symptoms, tamoxifen initiated by Dr. Ma Hillock..  . Personal history of chemotherapy   . Personal history of radiation therapy   . Pneumonia   . Pulmonary hypertension ([place]) 2013  . Sleep apnea 2007   does not use cpap   . Sleep apnea 2007    PAST SURGICAL HISTORY :   Past Surgical History:  Procedure Laterality Date  . BREAST BIOPSY Left 2013   neg,   . BREAST BIOPSY Left 01/14   invasive ductal carcinoma  . BREAST BIOPSY Left 12/14   benign, done at Dr. Dwyane Luo office, [place]  . BREAST ENHANCEMENT SURGERY  2014,05/2013  .  BREAST LUMPECTOMY Left 2014   invasive ductal carcinoma  . BREAST SURGERY Left 01/30/2012   Wide excision, sentinel node biopsy  . BREAST SURGERY Left 02/09/2012   Left breast reduction/reconstruction: Nicholaus Bloom, MD.  . BREAST SURGERY Left December 26, 2012   ultrasound-guided biopsy, post surgical change.  . COLONOSCOPY  2012  . DILATION AND CURETTAGE OF UTERUS  2011   . EXPLORATORY LAPAROTOMY  2001  . FACIAL COSMETIC SURGERY  2015  . FLEXIBLE BRONCHOSCOPY N/A 09/12/2018   Procedure: BRONCHOSCOPY, BRONCHOALVEOLAR LAVAGE, SLEEP APNEA;  Surgeon: Ottie Glazier, MD;  Location: [facility] ORS;  Service: Thoracic;  Laterality: N/A;  . [place]  . NISSEN FUNDOPLICATION  [id number]  . OVARY SURGERY Right 2001  . PORT A CATH REVISION  2014  . REDUCTION MAMMAPLASTY Left 2014  . REDUCTION MAMMAPLASTY Right 2015  . RIGHT HEART CATH  2013  . TONSILLECTOMY  1969    FAMILY HISTORY :   Family History  Problem Relation Age of Onset  . Colon cancer Paternal Grandfather   . Breast cancer Maternal Aunt 56  . Breast cancer Cousin 68  . Breast cancer Cousin 8  . Breast cancer Cousin 17    SOCIAL HISTORY:   Social History   Tobacco Use  . Smoking status: Former Smoker    Packs/day: 0.10    Years: 25.00    Pack years: 2.50    Types: Cigarettes    Quit date: 01/11/2007    Years since quitting: 12.1  . Smokeless tobacco: Never Used  . Tobacco comment: "social smoker" per pt.  Substance Use Topics  . Alcohol use: No  . Drug use: No    ALLERGIES:  is allergic to augmentin [amoxicillin-pot clavulanate].  MEDICATIONS:  Current Outpatient Medications  Medication Sig Dispense Refill  . albuterol (VENTOLIN HFA) 108 (90 Base) MCG/ACT inhaler Inhale 1-2 puffs into the lungs every 6 (six) hours as needed for wheezing or shortness of breath.    . diclofenac (VOLTAREN) 75 MG EC tablet Take 75 mg by mouth 2 (two) times daily.     . montelukast (SINGULAIR) 10 MG tablet TAKE ONE TABLET BY MOUTH AT BEDTIME 30 tablet 0  . pravastatin (PRAVACHOL) 10 MG tablet Take 10 mg by mouth at bedtime.    Marland Kitchen venlafaxine (EFFEXOR) 75 MG tablet TAKE ONE TABLET BY MOUTH TWICE DAILY WITH MEALS (Patient taking differently: Take 75 mg by mouth every morning. ) 60 tablet 3  . letrozole (FEMARA) 2.5 MG tablet Take 1 tablet by mouth once daily (Patient not taking: Reported on 02/19/2019) 90  tablet 0   No current facility-administered medications for this visit.   Facility-Administered Medications Ordered in Other Visits  Medication Dose Route Frequency Provider Last Rate Last Admin  . alteplase (CATHFLO ACTIVASE) injection 2 mg  2 mg Intracatheter Once PRN Charlaine Dalton R, MD      . heparin lock flush 100 unit/mL  500 Units Intracatheter Once PRN Charlaine Dalton R, MD      . heparin lock flush 100 unit/mL  250 Units Intracatheter Once PRN Charlaine Dalton R, MD      . sodium chloride flush (NS) 0.9 % injection 10 mL  10 mL Intracatheter Once PRN Charlaine Dalton R, MD      . sodium chloride flush (NS) 0.9 % injection 3 mL  3 mL Intracatheter Once PRN Cammie Sickle, MD        PHYSICAL EXAMINATION: ECOG PERFORMANCE STATUS: 0 - Asymptomatic  BP  129/61 (Patient Position: Sitting)   Pulse 79   Temp (!) 95.5 F (35.3 C) (Tympanic)   Resp 20   Ht _0  (1.549 m)   Wt 207 lb (93.9 kg)   BMI 39.11 kg/m   Filed Weights   02/20/19 0953  Weight: 207 lb (93.9 kg)    Physical Exam  Constitutional: She is oriented to person, place, and time and well-developed, well-nourished, and in no distress.  HENT:  Head: Normocephalic and atraumatic.  Mouth/Throat: Oropharynx is clear and moist. No oropharyngeal exudate.  Eyes: Pupils are equal, round, and reactive to light.  Cardiovascular: Normal rate and regular rhythm.  Pulmonary/Chest: No respiratory distress. She has no wheezes.  Abdominal: Soft. Bowel sounds are normal. She exhibits no distension and no mass. There is no abdominal tenderness. There is no rebound and no guarding.  Musculoskeletal:        General: No tenderness or edema. Normal range of motion.     Cervical back: Normal range of motion and neck supple.  Neurological: She is alert and oriented to person, place, and time.  Skin: Skin is warm.      Psychiatric: Affect normal.    LABORATORY DATA:  I have reviewed the data as listed     Component Value Date/Time   NA 138 02/20/2019 0929   NA 140 09/25/2013 1425   K 3.9 02/20/2019 0929   K 4.0 09/25/2013 1425   CL 108 02/20/2019 0929   CL 103 09/25/2013 1425   CO2 23 02/20/2019 0929   CO2 29 09/25/2013 1425   GLUCOSE 108 (H) 02/20/2019 0929   GLUCOSE 92 09/25/2013 1425   BUN 14 02/20/2019 0929   BUN 17 09/25/2013 1425   CREATININE 0.59 02/20/2019 0929   CREATININE 0.87 09/25/2013 1425   CALCIUM 9.0 02/20/2019 0929   CALCIUM 10.1 09/25/2013 1425   PROT 6.8 08/15/2018 1247   PROT 7.2 09/25/2013 1425   ALBUMIN 4.0 08/15/2018 1247   ALBUMIN 3.9 09/25/2013 1425   AST 26 08/15/2018 1247   AST 30 09/25/2013 1425   ALT 38 08/15/2018 1247   ALT 55 09/25/2013 1425   ALKPHOS 82 08/15/2018 1247   ALKPHOS 154 (H) 09/25/2013 1425   BILITOT 0.6 08/15/2018 1247   BILITOT 0.4 09/25/2013 1425   GFRNONAA >60 02/20/2019 0929   GFRNONAA >60 09/25/2013 1425   GFRAA >60 02/20/2019 0929   GFRAA >60 09/25/2013 1425    No results found for: SPEP, UPEP  Lab Results  Component Value Date   WBC 5.3 02/20/2019   NEUTROABS 2.6 02/20/2019   HGB 13.3 02/20/2019   HCT 40.6 02/20/2019   MCV 93.3 02/20/2019   PLT 259 02/20/2019      Chemistry      Component Value Date/Time   NA 138 02/20/2019 0929   NA 140 09/25/2013 1425   K 3.9 02/20/2019 0929   K 4.0 09/25/2013 1425   CL 108 02/20/2019 0929   CL 103 09/25/2013 1425   CO2 23 02/20/2019 0929   CO2 29 09/25/2013 1425   BUN 14 02/20/2019 0929   BUN 17 09/25/2013 1425   CREATININE 0.59 02/20/2019 0929   CREATININE 0.87 09/25/2013 1425      Component Value Date/Time   CALCIUM 9.0 02/20/2019 0929   CALCIUM 10.1 09/25/2013 1425   ALKPHOS 82 08/15/2018 1247   ALKPHOS 154 (H) 09/25/2013 1425   AST 26 08/15/2018 1247   AST 30 09/25/2013 1425   ALT 38 08/15/2018 1247  ALT 55 09/25/2013 1425   BILITOT 0.6 08/15/2018 1247   BILITOT 0.4 09/25/2013 1425       RADIOGRAPHIC STUDIES: I have personally reviewed the  radiological images as listed and agreed with the findings in the report. No results found.   ASSESSMENT & PLAN:  Malignant neoplasm of overlapping sites of left breast in female, estrogen receptor positive ([place]) # Stage I ER/PR positive HER-2/neu positive breast cancer currently on adjuvant femara. Stable.  No clinical evidence of recurrence.    #Adjuvant Femara has been held for the last 3 months because of respiratory issues [see below].  I think is reasonable to start letrozole as patient is clinically stable.  # Chronic cough/dyspnea/Layrngeal [Dr. Fleming/Mcqueen]-extensive work-up including bronchoscopy negative.  Repeat CT scan November 2020-improving.  Overall clinically stable.  # Hot flashes- grade 1; stable.  # Osteopenia- 2019- BMD; continue ca+vitD. On zometa x every 6 months.  # DISPOSITION: # Zometa today # Follow up in 6 months; MD;  cbc/bmp; zometa-Dr.B  Cc: [name].    Orders Placed This Encounter  Procedures  . CBC with Differential    Standing Status:   Future    Standing Expiration Date:   02/20/2020  . Basic metabolic panel    Standing Status:   Future    Standing Expiration Date:   02/20/2020   All questions were answered. The patient knows to call the clinic with any problems, questions or concerns.      Cammie Sickle, MD 02/20/2019 12:18 PM

## 2019-02-27 ENCOUNTER — Ambulatory Visit: Payer: Federal, State, Local not specified - PPO

## 2019-02-27 ENCOUNTER — Ambulatory Visit: Payer: Federal, State, Local not specified - PPO | Admitting: Internal Medicine

## 2019-02-27 ENCOUNTER — Other Ambulatory Visit: Payer: Federal, State, Local not specified - PPO

## 2019-05-31 ENCOUNTER — Other Ambulatory Visit: Payer: Self-pay | Admitting: Internal Medicine

## 2019-08-02 ENCOUNTER — Other Ambulatory Visit: Payer: Self-pay | Admitting: Pulmonary Disease

## 2019-08-02 DIAGNOSIS — R0600 Dyspnea, unspecified: Secondary | ICD-10-CM

## 2019-08-05 ENCOUNTER — Other Ambulatory Visit: Payer: Self-pay | Admitting: Oncology

## 2019-08-05 DIAGNOSIS — R109 Unspecified abdominal pain: Secondary | ICD-10-CM

## 2019-08-05 DIAGNOSIS — C50812 Malignant neoplasm of overlapping sites of left female breast: Secondary | ICD-10-CM

## 2019-08-06 ENCOUNTER — Other Ambulatory Visit: Payer: Self-pay | Admitting: Internal Medicine

## 2019-08-06 DIAGNOSIS — Z1231 Encounter for screening mammogram for malignant neoplasm of breast: Secondary | ICD-10-CM

## 2019-08-21 ENCOUNTER — Other Ambulatory Visit: Payer: Self-pay

## 2019-08-21 ENCOUNTER — Ambulatory Visit
Admission: RE | Admit: 2019-08-21 | Discharge: 2019-08-21 | Disposition: A | Discharge status: Home / Self Care | Payer: Federal, State, Local not specified - PPO | Source: Ambulatory Visit | Attending: Pulmonary Disease | Admitting: Pulmonary Disease

## 2019-08-21 ENCOUNTER — Inpatient Hospital Stay: Payer: Federal, State, Local not specified - PPO | Attending: Internal Medicine

## 2019-08-21 ENCOUNTER — Encounter: Payer: Self-pay | Admitting: Internal Medicine

## 2019-08-21 ENCOUNTER — Inpatient Hospital Stay (HOSPITAL_BASED_OUTPATIENT_CLINIC_OR_DEPARTMENT_OTHER): Payer: Federal, State, Local not specified - PPO | Admitting: Internal Medicine

## 2019-08-21 ENCOUNTER — Inpatient Hospital Stay: Payer: Federal, State, Local not specified - PPO

## 2019-08-21 VITALS — BP 118/74 | HR 93 | Temp 97.5°F | Resp 20 | Ht 61.0 in | Wt 180.5 lb

## 2019-08-21 DIAGNOSIS — K589 Irritable bowel syndrome without diarrhea: Secondary | ICD-10-CM | POA: Insufficient documentation

## 2019-08-21 DIAGNOSIS — Z170 Estrogen receptor positive status [ER+]: Secondary | ICD-10-CM | POA: Diagnosis not present

## 2019-08-21 DIAGNOSIS — Z79899 Other long term (current) drug therapy: Secondary | ICD-10-CM | POA: Insufficient documentation

## 2019-08-21 DIAGNOSIS — J45909 Unspecified asthma, uncomplicated: Secondary | ICD-10-CM | POA: Insufficient documentation

## 2019-08-21 DIAGNOSIS — K219 Gastro-esophageal reflux disease without esophagitis: Secondary | ICD-10-CM | POA: Insufficient documentation

## 2019-08-21 DIAGNOSIS — N951 Menopausal and female climacteric states: Secondary | ICD-10-CM | POA: Diagnosis not present

## 2019-08-21 DIAGNOSIS — C50812 Malignant neoplasm of overlapping sites of left female breast: Secondary | ICD-10-CM

## 2019-08-21 DIAGNOSIS — Z79811 Long term (current) use of aromatase inhibitors: Secondary | ICD-10-CM | POA: Diagnosis not present

## 2019-08-21 DIAGNOSIS — Z923 Personal history of irradiation: Secondary | ICD-10-CM | POA: Insufficient documentation

## 2019-08-21 DIAGNOSIS — Z791 Long term (current) use of non-steroidal anti-inflammatories (NSAID): Secondary | ICD-10-CM | POA: Diagnosis not present

## 2019-08-21 DIAGNOSIS — R0600 Dyspnea, unspecified: Secondary | ICD-10-CM | POA: Diagnosis not present

## 2019-08-21 DIAGNOSIS — I272 Pulmonary hypertension, unspecified: Secondary | ICD-10-CM | POA: Diagnosis not present

## 2019-08-21 DIAGNOSIS — F419 Anxiety disorder, unspecified: Secondary | ICD-10-CM | POA: Diagnosis not present

## 2019-08-21 DIAGNOSIS — M8580 Other specified disorders of bone density and structure, unspecified site: Secondary | ICD-10-CM | POA: Diagnosis not present

## 2019-08-21 DIAGNOSIS — Z803 Family history of malignant neoplasm of breast: Secondary | ICD-10-CM | POA: Insufficient documentation

## 2019-08-21 DIAGNOSIS — Z87891 Personal history of nicotine dependence: Secondary | ICD-10-CM | POA: Diagnosis not present

## 2019-08-21 DIAGNOSIS — Z9221 Personal history of antineoplastic chemotherapy: Secondary | ICD-10-CM | POA: Diagnosis not present

## 2019-08-21 LAB — CBC WITH DIFFERENTIAL/PLATELET
Abs Immature Granulocytes: 0.02 10*3/uL (ref 0.00–0.07)
Basophils Absolute: 0 10*3/uL (ref 0.0–0.1)
Basophils Relative: 1 %
Eosinophils Absolute: 0.1 10*3/uL (ref 0.0–0.5)
Eosinophils Relative: 1 %
HCT: 36.5 % (ref 36.0–46.0)
Hemoglobin: 12.4 g/dL (ref 12.0–15.0)
Immature Granulocytes: 0 %
Lymphocytes Relative: 34 %
Lymphs Abs: 2.2 10*3/uL (ref 0.7–4.0)
MCH: 31.3 pg (ref 26.0–34.0)
MCHC: 34 g/dL (ref 30.0–36.0)
MCV: 92.2 fL (ref 80.0–100.0)
Monocytes Absolute: 0.4 10*3/uL (ref 0.1–1.0)
Monocytes Relative: 6 %
Neutro Abs: 3.8 10*3/uL (ref 1.7–7.7)
Neutrophils Relative %: 58 %
Platelets: 271 10*3/uL (ref 150–400)
RBC: 3.96 MIL/uL (ref 3.87–5.11)
RDW: 12.1 % (ref 11.5–15.5)
WBC: 6.4 10*3/uL (ref 4.0–10.5)
nRBC: 0 % (ref 0.0–0.2)

## 2019-08-21 LAB — BASIC METABOLIC PANEL
Anion gap: 9 (ref 5–15)
BUN: 15 mg/dL (ref 6–20)
CO2: 24 mmol/L (ref 22–32)
Calcium: 8.9 mg/dL (ref 8.9–10.3)
Chloride: 106 mmol/L (ref 98–111)
Creatinine, Ser: 0.65 mg/dL (ref 0.44–1.00)
GFR calc Af Amer: >60 mL/min (ref >60)
GFR calc non Af Amer: >60 mL/min (ref >60)
Glucose, Bld: 141 mg/dL — ABNORMAL HIGH (ref 70–99)
Potassium: 3.6 mmol/L (ref 3.5–5.1)
Sodium: 139 mmol/L (ref 135–145)

## 2019-08-21 MED ORDER — SODIUM CHLORIDE 0.9 % IV SOLN
Freq: Once | INTRAVENOUS | Status: AC
  Filled 2019-08-21: qty 250

## 2019-08-21 MED ORDER — ZOLEDRONIC ACID 4 MG/100ML IV SOLN
4.0000 mg | Freq: Once | INTRAVENOUS | Status: AC
  Administered 2019-08-21: 4 mg via INTRAVENOUS
  Filled 2019-08-21: qty 100

## 2019-08-21 NOTE — Progress Notes (Signed)
Patient reports discomfort around the incision site. Patient does not perform self breast exams.

## 2019-08-21 NOTE — Assessment & Plan Note (Addendum)
#  Stage I ER/PR positive HER-2/neu positive breast cancer currently on adjuvant femara. STABLE;  No clinical evidence of recurrence. Tolerating well; continue Femara. mammo- sep 2020- WNL.   # Weight loss- intentional; congratulated the patient on her weight loss.  No concerns for progressive malignancy.  # Chronic cough/dyspnea/Layrngeal [Dr. A/[facility]]-extensive work-up including bronchoscopy negative.  CT AUG 2021-   # Vocal cord paralysis/hoarseness of voice- [[facility]] ? Rheumatology- awaiting work up.  Reviewed note.  # Hot flashes- grade 1-stable  # Osteopenia- 2019- BMD; continue ca+vitD. On zometa x every 6 months.  Order bone density next visit.  Tolerating well.  Stable  #Right upper lobe groundglass opacity-symptoms of cough shortness of breath improving; CT scan August 21, 2019-pending; independent review seems to be improving.  Await follow-up with Dr.A  # DISPOSITION: # Bone density test- please schedule with mammo in sep 2021.  # Zometa today # Follow up in 6 months; MD;  cbc/bmp; zometa-Dr.B  Cc: A

## 2019-08-21 NOTE — Progress Notes (Signed)
[place] OFFICE PROGRESS NOTE  Patient Care Team: Kirk Ruths, MD as PCP - General (Internal Medicine) Bary Castilla Forest Gleason, MD (General Surgery) Dear, [name], MD (Inactive) (Family Medicine)  Cancer Staging No matching staging information was found for the patient.   Oncology History Overview Note  # JAN 2014- LEFT BREAST CA IDC; pT1c (1.2cm) pN0 [Stage I] ER/PR > 90%; her 2 Neu POS; AC x4- Taxol-Herceptin; Intol to AI; March 2015- Started TAM [stopped- Sep 2017]; OCT 2017- Post menopausal [estradiol/FSH]; NOV 1st 2017- Start ARIMIDEX  # DEC 2016- Start Effexor   # Right oopherectomy; Left intact ovary-intact [last periods late 90s]  DIAGNOSIS:Breast cancer  STAGE:  I       ;GOALS: cure  CURRENT/MOST RECENT THERAPY: anastrazole    Malignant neoplasm of overlapping sites of left breast in female, estrogen receptor positive ([place])      INTERVAL HISTORY:  STARKISHA TULLIS 59 y.o.  female pleasant patient above history of stage I breast cancer ER PR positive currently on adjuvant Arimidex is here for follow-up.  Patient interim had a CT scan this morning-for follow-up of right upper lobe groundglass opacities with pulmonary.  She also had followed up with ENT at [facility] with vocal cord paralysis unclear etiology.  Rheumatology work-up in progress  Weight loss- ~ 30 pounds intentional- dietary changes.  Breathing is improved.  No significant cough.  Continues to have hoarseness of voice.  Review of Systems  Constitutional: Negative for chills, diaphoresis and weight loss.  HENT: Negative for nosebleeds and sore throat.   Eyes: Negative for double vision.  Respiratory: Negative for hemoptysis and wheezing.   Cardiovascular: Negative for palpitations, orthopnea and leg swelling.  Gastrointestinal: Negative for abdominal pain, blood in stool, constipation, diarrhea, heartburn, melena, nausea and vomiting.  Genitourinary: Negative for dysuria,  frequency and urgency.  Musculoskeletal: Positive for joint pain. Negative for back pain.  Skin: Negative.  Negative for itching and rash.  Neurological: Negative for dizziness, tingling, focal weakness, weakness and headaches.  Endo/Heme/Allergies: Does not bruise/bleed easily.  Psychiatric/Behavioral: Negative for depression. The patient is not nervous/anxious and does not have insomnia.       PAST MEDICAL HISTORY :  Past Medical History:  Diagnosis Date  . Anxiety   . Arthritis   . Asthma   . Breast CA ([place])   . Breast cancer ([place]) 2014   left breast  . Dyspnea    with dry cough x 7 years  . Endometriosis 2001  . GERD (gastroesophageal reflux disease)   . Headache    h/o migraines  . Heart murmur    asymptomatic  . Irritable bowel syndrome   . Malignant neoplasm of upper-outer quadrant of female breast ([place]) 01/30/2012   Left breast, Invasive mammary cancer, no special type: T1c,N0,M0; ER 90%, PR 90%, no amplification of HER-2/neu, aromatase inhibitors discontinued September 2015 secondary to joint symptoms, tamoxifen initiated by Dr. Ma Hillock..  . Personal history of chemotherapy   . Personal history of radiation therapy   . Pneumonia   . Pulmonary hypertension ([place]) 2013  . Sleep apnea 2007   does not use cpap   . Sleep apnea 2007    PAST SURGICAL HISTORY :   Past Surgical History:  Procedure Laterality Date  . BREAST BIOPSY Left 2013   neg,   . BREAST BIOPSY Left 01/14   invasive ductal carcinoma  . BREAST BIOPSY Left 12/14   benign, done at Dr. Dwyane Luo office, [place]  .  BREAST ENHANCEMENT SURGERY  2014,05/2013  . BREAST LUMPECTOMY Left 2014   invasive ductal carcinoma  . BREAST SURGERY Left 01/30/2012   Wide excision, sentinel node biopsy  . BREAST SURGERY Left 02/09/2012   Left breast reduction/reconstruction: Nicholaus Bloom, MD.  . BREAST SURGERY Left December 26, 2012   ultrasound-guided biopsy, post surgical change.  . COLONOSCOPY  2012  . DILATION AND  CURETTAGE OF UTERUS  2011  . EXPLORATORY LAPAROTOMY  2001  . FACIAL COSMETIC SURGERY  2015  . FLEXIBLE BRONCHOSCOPY N/A 09/12/2018   Procedure: BRONCHOSCOPY, BRONCHOALVEOLAR LAVAGE, SLEEP APNEA;  Surgeon: Ottie Glazier, MD;  Location: [facility] ORS;  Service: Thoracic;  Laterality: N/A;  . [place]  . NISSEN FUNDOPLICATION  9211  . OVARY SURGERY Right 2001  . PORT A CATH REVISION  2014  . REDUCTION MAMMAPLASTY Left 2014  . REDUCTION MAMMAPLASTY Right 2015  . RIGHT HEART CATH  2013  . TONSILLECTOMY  1969    FAMILY HISTORY :   Family History  Problem Relation Age of Onset  . Colon cancer Paternal Grandfather   . Breast cancer Maternal Aunt 47  . Breast cancer Cousin 76  . Breast cancer Cousin 10  . Breast cancer Cousin 59    SOCIAL HISTORY:   Social History   Tobacco Use  . Smoking status: Former Smoker    Packs/day: 0.10    Years: 25.00    Pack years: 2.50    Types: Cigarettes    Quit date: 01/11/2007    Years since quitting: 12.6  . Smokeless tobacco: Never Used  . Tobacco comment: "social smoker" per pt.  Vaping Use  . Vaping Use: Never used  Substance Use Topics  . Alcohol use: No  . Drug use: No    ALLERGIES:  is allergic to augmentin [amoxicillin-pot clavulanate].  MEDICATIONS:  Current Outpatient Medications  Medication Sig Dispense Refill  . diclofenac (VOLTAREN) 75 MG EC tablet Take 75 mg by mouth 2 (two) times daily.     Marland Kitchen letrozole (FEMARA) 2.5 MG tablet Take 1 tablet by mouth once daily 90 tablet 0  . montelukast (SINGULAIR) 10 MG tablet TAKE ONE TABLET BY MOUTH AT BEDTIME 30 tablet 0  . pravastatin (PRAVACHOL) 10 MG tablet Take 10 mg by mouth at bedtime.    Marland Kitchen venlafaxine (EFFEXOR) 75 MG tablet TAKE ONE TABLET BY MOUTH TWICE DAILY WITH MEALS (Patient taking differently: Take 75 mg by mouth every morning. ) 60 tablet 3   No current facility-administered medications for this visit.   Facility-Administered Medications Ordered in Other Visits   Medication Dose Route Frequency Provider Last Rate Last Admin  . Zoledronic Acid (ZOMETA) IVPB 4 mg  4 mg Intravenous Once Charlaine Dalton R, MD 400 mL/hr at 08/21/19 1431 4 mg at 08/21/19 1431    PHYSICAL EXAMINATION: ECOG PERFORMANCE STATUS: 0 - Asymptomatic  BP 118/74   Pulse 93   Temp (!) 97.5 F (36.4 C) (Tympanic)   Resp 20   Ht _0  (1.549 m)   Wt 180 lb 8 oz (81.9 kg)   BMI 34.11 kg/m   Filed Weights   08/21/19 1325  Weight: 180 lb 8 oz (81.9 kg)    Physical Exam HENT:     Head: Normocephalic and atraumatic.     Mouth/Throat:     Pharynx: No oropharyngeal exudate.  Eyes:     Pupils: Pupils are equal, round, and reactive to light.  Cardiovascular:     Rate and Rhythm: Normal  rate and regular rhythm.  Pulmonary:     Effort: No respiratory distress.     Breath sounds: No wheezing.  Abdominal:     General: Bowel sounds are normal. There is no distension.     Palpations: Abdomen is soft. There is no mass.     Tenderness: There is no abdominal tenderness. There is no guarding or rebound.  Musculoskeletal:        General: No tenderness. Normal range of motion.     Cervical back: Normal range of motion and neck supple.  Skin:    General: Skin is warm.     Comments:     Neurological:     Mental Status: She is alert and oriented to person, place, and time.  Psychiatric:        Mood and Affect: Affect normal.     LABORATORY DATA:  I have reviewed the data as listed    Component Value Date/Time   NA 139 08/21/2019 1315   NA 140 09/25/2013 1425   K 3.6 08/21/2019 1315   K 4.0 09/25/2013 1425   CL 106 08/21/2019 1315   CL 103 09/25/2013 1425   CO2 24 08/21/2019 1315   CO2 29 09/25/2013 1425   GLUCOSE 141 (H) 08/21/2019 1315   GLUCOSE 92 09/25/2013 1425   BUN 15 08/21/2019 1315   BUN 17 09/25/2013 1425   CREATININE 0.65 08/21/2019 1315   CREATININE 0.87 09/25/2013 1425   CALCIUM 8.9 08/21/2019 1315   CALCIUM 10.1 09/25/2013 1425   PROT 6.8  08/15/2018 1247   PROT 7.2 09/25/2013 1425   ALBUMIN 4.0 08/15/2018 1247   ALBUMIN 3.9 09/25/2013 1425   AST 26 08/15/2018 1247   AST 30 09/25/2013 1425   ALT 38 08/15/2018 1247   ALT 55 09/25/2013 1425   ALKPHOS 82 08/15/2018 1247   ALKPHOS 154 (H) 09/25/2013 1425   BILITOT 0.6 08/15/2018 1247   BILITOT 0.4 09/25/2013 1425   GFRNONAA >60 08/21/2019 1315   GFRNONAA >60 09/25/2013 1425   GFRAA >60 08/21/2019 1315   GFRAA >60 09/25/2013 1425    No results found for: SPEP, UPEP  Lab Results  Component Value Date   WBC 6.4 08/21/2019   NEUTROABS 3.8 08/21/2019   HGB 12.4 08/21/2019   HCT 36.5 08/21/2019   MCV 92.2 08/21/2019   PLT 271 08/21/2019      Chemistry      Component Value Date/Time   NA 139 08/21/2019 1315   NA 140 09/25/2013 1425   K 3.6 08/21/2019 1315   K 4.0 09/25/2013 1425   CL 106 08/21/2019 1315   CL 103 09/25/2013 1425   CO2 24 08/21/2019 1315   CO2 29 09/25/2013 1425   BUN 15 08/21/2019 1315   BUN 17 09/25/2013 1425   CREATININE 0.65 08/21/2019 1315   CREATININE 0.87 09/25/2013 1425      Component Value Date/Time   CALCIUM 8.9 08/21/2019 1315   CALCIUM 10.1 09/25/2013 1425   ALKPHOS 82 08/15/2018 1247   ALKPHOS 154 (H) 09/25/2013 1425   AST 26 08/15/2018 1247   AST 30 09/25/2013 1425   ALT 38 08/15/2018 1247   ALT 55 09/25/2013 1425   BILITOT 0.6 08/15/2018 1247   BILITOT 0.4 09/25/2013 1425       RADIOGRAPHIC STUDIES: I have personally reviewed the radiological images as listed and agreed with the findings in the report. No results found.   ASSESSMENT & PLAN:  Malignant neoplasm of overlapping sites of left  breast in female, estrogen receptor positive ([place]) # Stage I ER/PR positive HER-2/neu positive breast cancer currently on adjuvant femara. STABLE;  No clinical evidence of recurrence. Tolerating well; continue Femara. mammo- sep 2020- WNL.   # Weight loss- intentional; congratulated the patient on her weight loss.  No concerns for  progressive malignancy.  # Chronic cough/dyspnea/Layrngeal [Dr. A/[facility]]-extensive work-up including bronchoscopy negative.  CT AUG 2021-   # Vocal cord paralysis/hoarseness of voice- [[facility]] ? Rheumatology- awaiting work up.  Reviewed note.  # Hot flashes- grade 1-stable  # Osteopenia- 2019- BMD; continue ca+vitD. On zometa x every 6 months.  Order bone density next visit.  Tolerating well.  Stable  #Right upper lobe groundglass opacity-symptoms of cough shortness of breath improving; CT scan August 21, 2019-pending; independent review seems to be improving.  Await follow-up with Dr.A  # DISPOSITION: # Bone density test- please schedule with mammo in sep 2021.  # Zometa today # Follow up in 6 months; MD;  cbc/bmp; zometa-Dr.B  Cc: A   Orders Placed This Encounter  Procedures  . DG Bone Density    Standing Status:   Future    Standing Expiration Date:   08/20/2020    Order Specific Question:   Reason for Exam (SYMPTOM  OR DIAGNOSIS REQUIRED)    Answer:   history of breast cancer, aromatose inhibitor    Order Specific Question:   Is the patient pregnant?    Answer:   No    Order Specific Question:   Preferred imaging location?    Answer:   [facility]   All questions were answered. The patient knows to call the clinic with any problems, questions or concerns.      Cammie Sickle, MD 08/21/2019 2:38 PM

## 2019-08-28 ENCOUNTER — Other Ambulatory Visit: Payer: Self-pay | Admitting: Pulmonary Disease

## 2019-08-28 ENCOUNTER — Other Ambulatory Visit
Admission: RE | Admit: 2019-08-28 | Discharge: 2019-08-28 | Disposition: A | Discharge status: Home / Self Care | Payer: Federal, State, Local not specified - PPO | Source: Ambulatory Visit | Attending: Pulmonary Disease | Admitting: Pulmonary Disease

## 2019-08-28 ENCOUNTER — Encounter
Admission: RE | Admit: 2019-08-28 | Discharge: 2019-08-28 | Disposition: A | Discharge status: Home / Self Care | Payer: Federal, State, Local not specified - PPO | Source: Ambulatory Visit | Attending: Pulmonary Disease | Admitting: Pulmonary Disease

## 2019-08-28 ENCOUNTER — Other Ambulatory Visit: Payer: Self-pay

## 2019-08-28 ENCOUNTER — Ambulatory Visit
Admission: RE | Admit: 2019-08-28 | Discharge: 2019-08-28 | Disposition: A | Discharge status: Home / Self Care | Payer: Federal, State, Local not specified - PPO | Source: Ambulatory Visit | Attending: Pulmonary Disease | Admitting: Pulmonary Disease

## 2019-08-28 DIAGNOSIS — R0600 Dyspnea, unspecified: Secondary | ICD-10-CM

## 2019-08-28 DIAGNOSIS — R0609 Other forms of dyspnea: Secondary | ICD-10-CM

## 2019-08-28 LAB — FIBRIN DERIVATIVES D-DIMER (ARMC ONLY): Fibrin derivatives D-dimer (ARMC): 381.5 ng{FEU}/mL (ref 0.00–499.00)

## 2019-08-28 MED ORDER — TECHNETIUM TO 99M ALBUMIN AGGREGATED
4.0700 | Freq: Once | INTRAVENOUS | Status: AC | PRN
  Administered 2019-08-28: 4.07 via INTRAVENOUS

## 2019-08-31 ENCOUNTER — Other Ambulatory Visit: Payer: Self-pay | Admitting: Internal Medicine

## 2019-09-18 ENCOUNTER — Ambulatory Visit
Admission: RE | Admit: 2019-09-18 | Discharge: 2019-09-18 | Disposition: A | Discharge status: Home / Self Care | Payer: Federal, State, Local not specified - PPO | Source: Ambulatory Visit | Attending: Internal Medicine | Admitting: Internal Medicine

## 2019-09-18 ENCOUNTER — Other Ambulatory Visit: Payer: Self-pay

## 2019-09-18 DIAGNOSIS — C50812 Malignant neoplasm of overlapping sites of left female breast: Secondary | ICD-10-CM | POA: Diagnosis not present

## 2019-09-18 DIAGNOSIS — Z170 Estrogen receptor positive status [ER+]: Secondary | ICD-10-CM

## 2019-09-18 DIAGNOSIS — Z1231 Encounter for screening mammogram for malignant neoplasm of breast: Secondary | ICD-10-CM | POA: Insufficient documentation

## 2019-09-18 DIAGNOSIS — Z79811 Long term (current) use of aromatase inhibitors: Secondary | ICD-10-CM

## 2019-10-02 ENCOUNTER — Telehealth: Payer: Self-pay | Admitting: Internal Medicine

## 2019-10-02 NOTE — Telephone Encounter (Signed)
On 9/21-spoke to patient regarding results of the bone density; improving continue Zometa.

## 2020-01-27 IMAGING — CT CT CHEST WITH CONTRAST
2 of 4 series · 15 of 36 positions shown, 18 images · IV contrast (omnipaque)
Comparison: 10/18/2012

CLINICAL DATA: Shortness of breath, cough, and chest tightness for
1 year, history LEFT breast cancer post lumpectomy, chemotherapy and
radiation therapy, former smoker

EXAM:
CT CHEST WITH CONTRAST
TECHNIQUE: Multidetector CT imaging of the chest was performed during
intravenous contrast administration. Sagittal and coronal MPR images
reconstructed from axial data set.
CONTRAST:  75mL OMNIPAQUE IOHEXOL 300 MG/ML  SOLN IV

[Series 2: axial chest · axial · 0.62mm/px · z∈[-1235,-963]mm · 12 of 162 slices shown, 15 images]
[im 13/162  mediastinal]
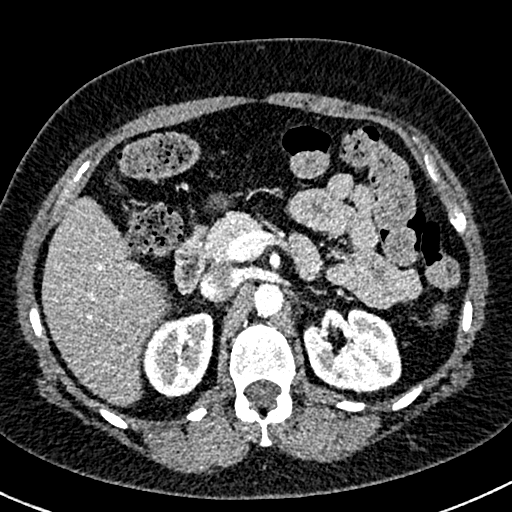
[im 13/162  lung]
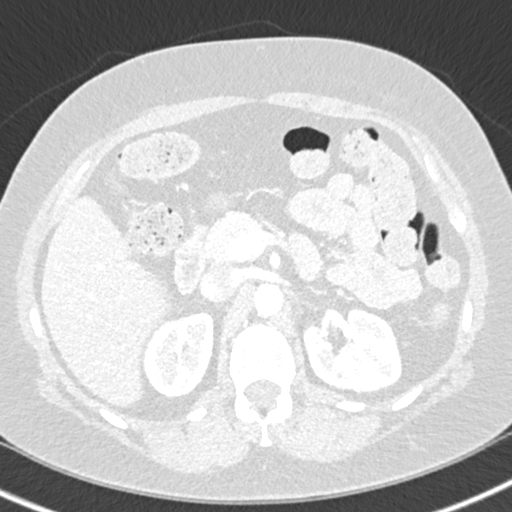
[im 25/162  lung]
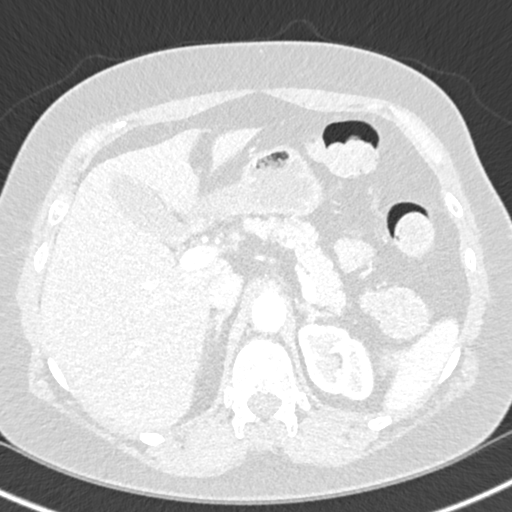
[im 38/162  lung]
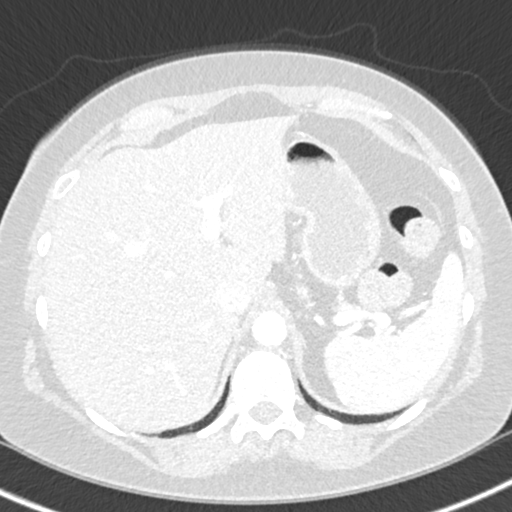
[im 50/162  lung]
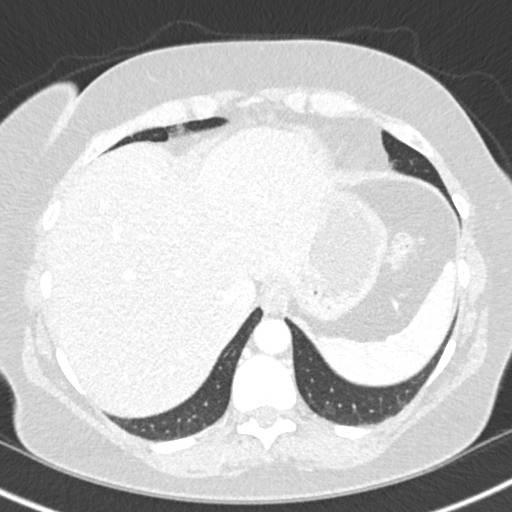
[im 62/162  mediastinal]
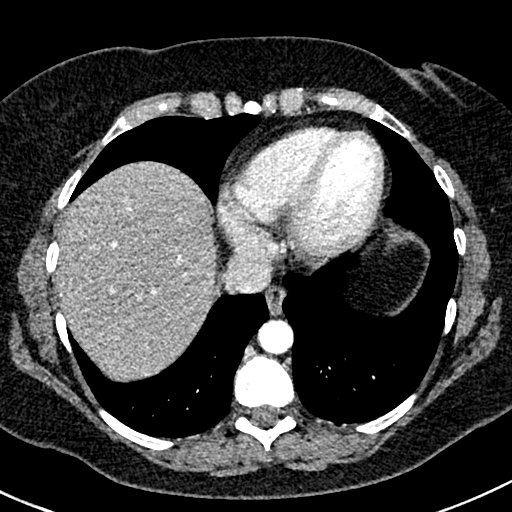
[im 62/162  lung]
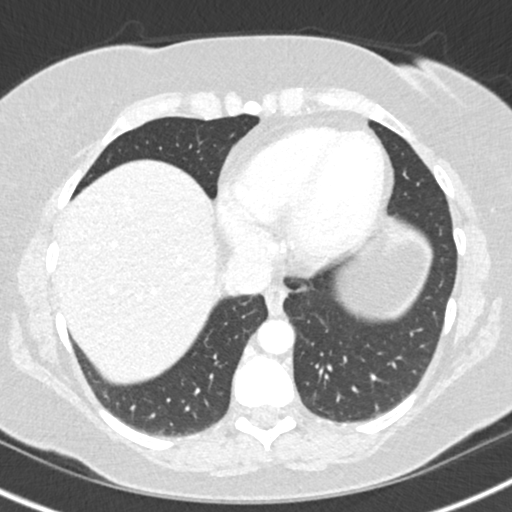
[im 75/162  lung]
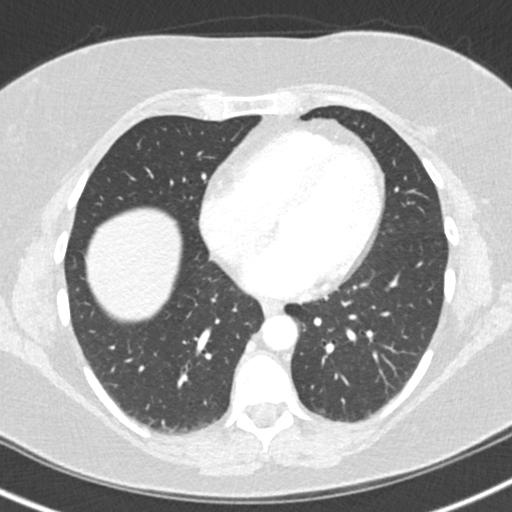
[im 87/162  lung]
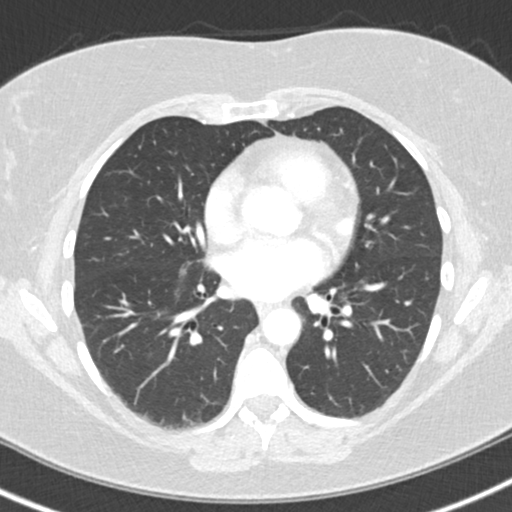
[im 100/162  lung]
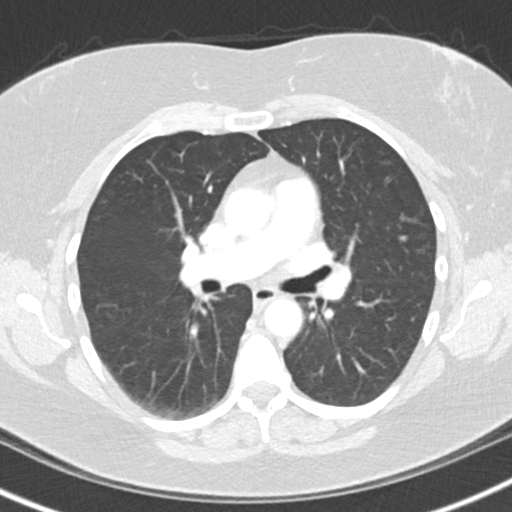
[im 112/162  mediastinal]
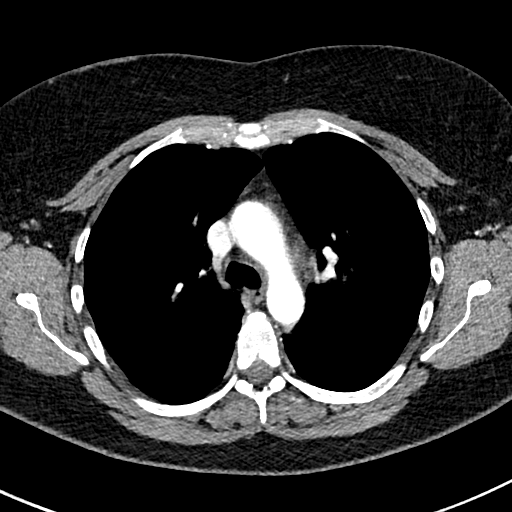
[im 112/162  lung]
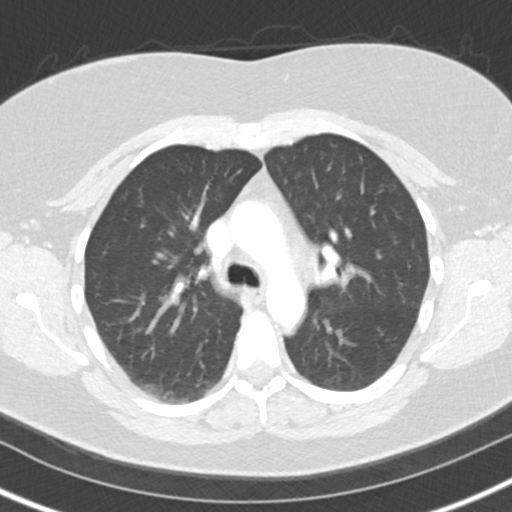
[im 124/162  lung]
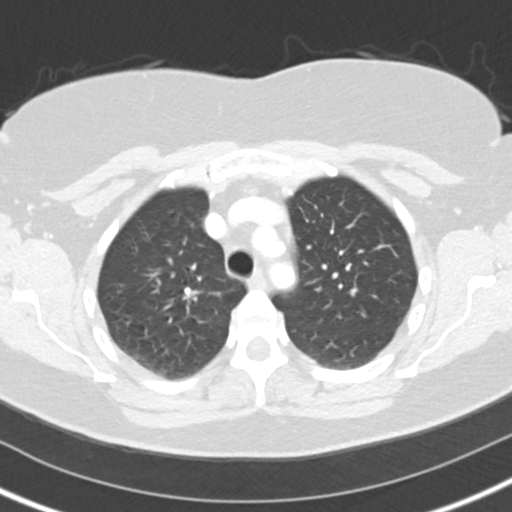
[im 137/162  lung]
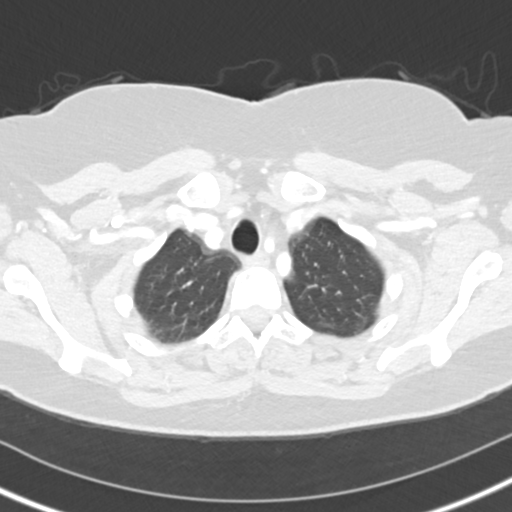
[im 149/162  lung]
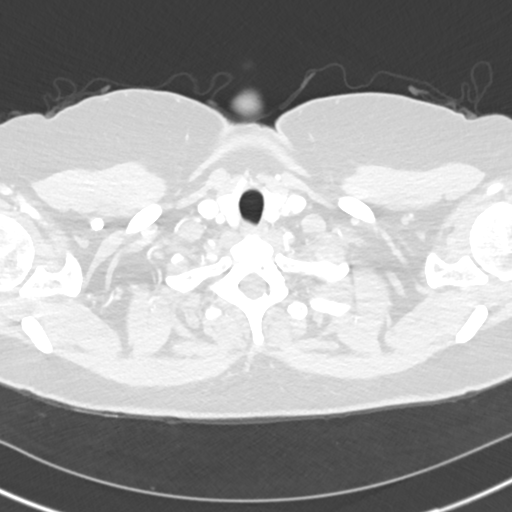

[Series 4: coronal chest · coronal · 0.62mm/px · 3 of 159 slices shown]
[im 32/159  lung]
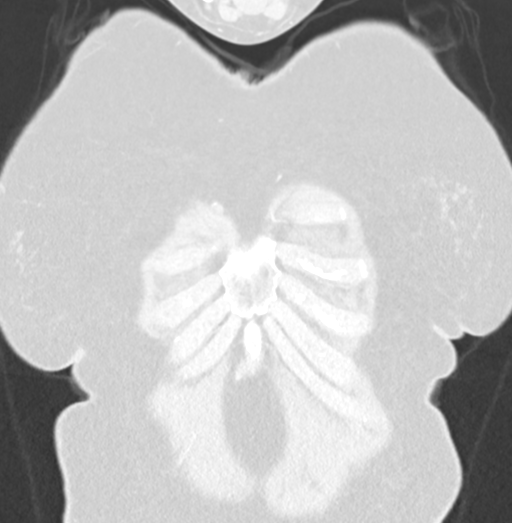
[im 64/159  lung]
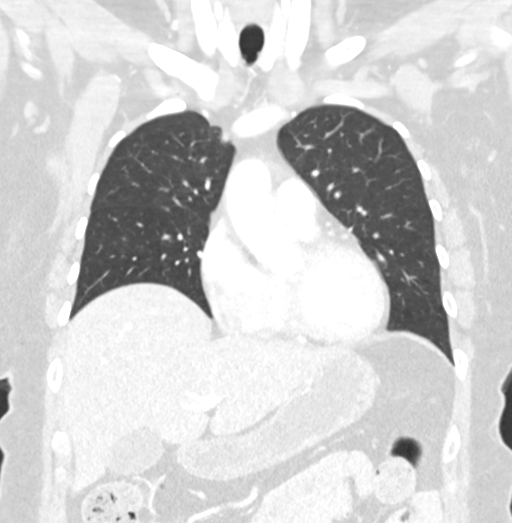
[im 95/159  lung]
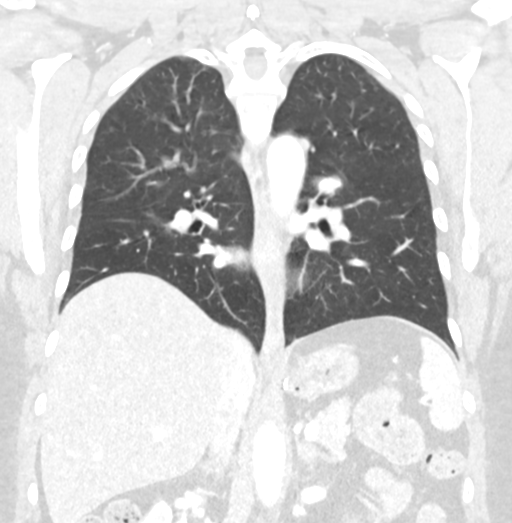

[15 of 36 positions shown; findings below may reference images not displayed]

FINDINGS: Cardiovascular: Vascular structures patent on non targeted exam.
Aorta normal caliber. No pericardial effusion.

Mediastinum/Nodes: Esophagus unremarkable. Base of cervical region
normal appearance. No thoracic adenopathy. Small chronic focus of
scarring deep in LEFT breast unchanged.

Lungs/Pleura: Lungs clear. No pulmonary infiltrate, pleural
effusion, or pneumothorax. No definite mass/nodule.

Upper Abdomen: Perigastric surgical clips and GE junction question
prior antireflux procedure. Remaining visualized upper abdomen
unremarkable

Musculoskeletal: No acute osseous findings.
IMPRESSION: No acute intra thoracic abnormalities.

## 2020-02-21 ENCOUNTER — Ambulatory Visit: Payer: Federal, State, Local not specified - PPO

## 2020-02-21 ENCOUNTER — Ambulatory Visit: Payer: Federal, State, Local not specified - PPO | Admitting: Internal Medicine

## 2020-02-21 ENCOUNTER — Other Ambulatory Visit: Payer: Federal, State, Local not specified - PPO

## 2020-02-26 ENCOUNTER — Inpatient Hospital Stay: Payer: Federal, State, Local not specified - PPO | Attending: Internal Medicine

## 2020-02-26 ENCOUNTER — Inpatient Hospital Stay: Payer: Federal, State, Local not specified - PPO

## 2020-02-26 ENCOUNTER — Inpatient Hospital Stay (HOSPITAL_BASED_OUTPATIENT_CLINIC_OR_DEPARTMENT_OTHER): Payer: Federal, State, Local not specified - PPO | Admitting: Internal Medicine

## 2020-02-26 ENCOUNTER — Other Ambulatory Visit: Payer: Self-pay

## 2020-02-26 ENCOUNTER — Encounter: Payer: Self-pay | Admitting: Internal Medicine

## 2020-02-26 VITALS — BP 117/60 | HR 72 | Temp 96.4°F | Resp 20 | Ht 61.0 in | Wt 186.5 lb

## 2020-02-26 DIAGNOSIS — Z170 Estrogen receptor positive status [ER+]: Secondary | ICD-10-CM

## 2020-02-26 DIAGNOSIS — C50812 Malignant neoplasm of overlapping sites of left female breast: Secondary | ICD-10-CM

## 2020-02-26 DIAGNOSIS — Z87891 Personal history of nicotine dependence: Secondary | ICD-10-CM | POA: Insufficient documentation

## 2020-02-26 DIAGNOSIS — G4730 Sleep apnea, unspecified: Secondary | ICD-10-CM | POA: Diagnosis not present

## 2020-02-26 DIAGNOSIS — Z79811 Long term (current) use of aromatase inhibitors: Secondary | ICD-10-CM | POA: Diagnosis not present

## 2020-02-26 DIAGNOSIS — M8580 Other specified disorders of bone density and structure, unspecified site: Secondary | ICD-10-CM | POA: Diagnosis present

## 2020-02-26 DIAGNOSIS — Z800 Family history of malignant neoplasm of digestive organs: Secondary | ICD-10-CM | POA: Diagnosis not present

## 2020-02-26 DIAGNOSIS — K219 Gastro-esophageal reflux disease without esophagitis: Secondary | ICD-10-CM | POA: Diagnosis not present

## 2020-02-26 DIAGNOSIS — R232 Flushing: Secondary | ICD-10-CM | POA: Diagnosis not present

## 2020-02-26 DIAGNOSIS — R490 Dysphonia: Secondary | ICD-10-CM | POA: Insufficient documentation

## 2020-02-26 DIAGNOSIS — M546 Pain in thoracic spine: Secondary | ICD-10-CM | POA: Diagnosis not present

## 2020-02-26 DIAGNOSIS — K589 Irritable bowel syndrome without diarrhea: Secondary | ICD-10-CM | POA: Diagnosis not present

## 2020-02-26 DIAGNOSIS — F419 Anxiety disorder, unspecified: Secondary | ICD-10-CM | POA: Diagnosis not present

## 2020-02-26 DIAGNOSIS — Z79899 Other long term (current) drug therapy: Secondary | ICD-10-CM | POA: Insufficient documentation

## 2020-02-26 DIAGNOSIS — J45909 Unspecified asthma, uncomplicated: Secondary | ICD-10-CM | POA: Insufficient documentation

## 2020-02-26 DIAGNOSIS — Z9221 Personal history of antineoplastic chemotherapy: Secondary | ICD-10-CM | POA: Diagnosis not present

## 2020-02-26 DIAGNOSIS — Z923 Personal history of irradiation: Secondary | ICD-10-CM | POA: Diagnosis not present

## 2020-02-26 DIAGNOSIS — Z803 Family history of malignant neoplasm of breast: Secondary | ICD-10-CM | POA: Insufficient documentation

## 2020-02-26 DIAGNOSIS — M549 Dorsalgia, unspecified: Secondary | ICD-10-CM

## 2020-02-26 LAB — CBC WITH DIFFERENTIAL/PLATELET
Abs Immature Granulocytes: 0.01 10*3/uL (ref 0.00–0.07)
Basophils Absolute: 0 10*3/uL (ref 0.0–0.1)
Basophils Relative: 1 %
Eosinophils Absolute: 0.1 10*3/uL (ref 0.0–0.5)
Eosinophils Relative: 1 %
HCT: 38 % (ref 36.0–46.0)
Hemoglobin: 13 g/dL (ref 12.0–15.0)
Immature Granulocytes: 0 %
Lymphocytes Relative: 43 %
Lymphs Abs: 2.7 10*3/uL (ref 0.7–4.0)
MCH: 31.5 pg (ref 26.0–34.0)
MCHC: 34.2 g/dL (ref 30.0–36.0)
MCV: 92 fL (ref 80.0–100.0)
Monocytes Absolute: 0.4 10*3/uL (ref 0.1–1.0)
Monocytes Relative: 6 %
Neutro Abs: 3.1 10*3/uL (ref 1.7–7.7)
Neutrophils Relative %: 49 %
Platelets: 268 10*3/uL (ref 150–400)
RBC: 4.13 MIL/uL (ref 3.87–5.11)
RDW: 11.7 % (ref 11.5–15.5)
WBC: 6.3 10*3/uL (ref 4.0–10.5)
nRBC: 0 % (ref 0.0–0.2)

## 2020-02-26 LAB — BASIC METABOLIC PANEL
Anion gap: 7 (ref 5–15)
BUN: 16 mg/dL (ref 6–20)
CO2: 29 mmol/L (ref 22–32)
Calcium: 9.5 mg/dL (ref 8.9–10.3)
Chloride: 105 mmol/L (ref 98–111)
Creatinine, Ser: 0.62 mg/dL (ref 0.44–1.00)
GFR, Estimated: >60 mL/min (ref >60)
Glucose, Bld: 95 mg/dL (ref 70–99)
Potassium: 4 mmol/L (ref 3.5–5.1)
Sodium: 141 mmol/L (ref 135–145)

## 2020-02-26 MED ORDER — LETROZOLE 2.5 MG PO TABS
2.5000 mg | ORAL_TABLET | Freq: Every day | ORAL | 1 refills | Status: DC
Start: 2020-02-26 — End: 2020-08-17

## 2020-02-26 MED ORDER — SODIUM CHLORIDE 0.9 % IV SOLN
Freq: Once | INTRAVENOUS | Status: AC
  Filled 2020-02-26: qty 250

## 2020-02-26 MED ORDER — ZOLEDRONIC ACID 4 MG/100ML IV SOLN
4.0000 mg | Freq: Once | INTRAVENOUS | Status: AC
  Administered 2020-02-26: 4 mg via INTRAVENOUS
  Filled 2020-02-26: qty 100

## 2020-02-26 NOTE — Assessment & Plan Note (Addendum)
#  Stage I ER/PR positive HER-2/neu positive breast cancer currently on adjuvant femara.  Stable.  No clinical evidence of recurrence. Tolerating well; continue Letrozole mammo- sep 2021- WNL.   # low thoracic Back pain-radiating to front- since May 2021. Aug 2021-CT scan reviewed no evidence of any lesions noted.  # Chronic cough/dyspnea/Layrngeal [Dr. A/[facility]]-extensive work-up including bronchoscopy Negative- STABLE.  Defer to pulmonary.  # Vocal cord paralysis/hoarseness of voice-? etiology [[facility]] ? Rheumatology-titers-positive.  Defer to ENT/rheumatology.  # Hot flashes- grade 1 STABLE.   # Osteopenia-OCT 2021- Neck Left is 0.872 g/cm2 with a T-score of -1.2. On zometa x every 6 months.  STABLE.   # DISPOSITION: # MRI thoracic spine ASAP # Zometa today # Follow up in 6 months; MD;  cbc/bmp; zometa-Dr.B  Cc: A

## 2020-02-26 NOTE — Progress Notes (Signed)
patient reports unexplained mid thoracic back pain x several months

## 2020-02-26 NOTE — Progress Notes (Signed)
[place] OFFICE PROGRESS NOTE  Patient Care Team: Kirk Ruths, MD as PCP - General (Internal Medicine) Bary Castilla Forest Gleason, MD (General Surgery) Dear, [name], MD (Inactive) (Family Medicine)  Cancer Staging No matching staging information was found for the patient.   Oncology History Overview Note  # JAN 2014- LEFT BREAST CA IDC; pT1c (1.2cm) pN0 [Stage I] ER/PR > 90%; her 2 Neu POS; AC x4- Taxol-Herceptin; Intol to AI; March 2015- Started TAM [stopped- Sep 2017]; OCT 2017- Post menopausal [estradiol/FSH]; NOV 1st 2017- Start ARIMIDEX  # DEC 2016- Start Effexor   # Right oopherectomy; Left intact ovary-intact [last periods late 90s]  DIAGNOSIS:Breast cancer  STAGE:  I       ;GOALS: cure  CURRENT/MOST RECENT THERAPY: anastrazole    Malignant neoplasm of overlapping sites of left breast in female, estrogen receptor positive ([place])      INTERVAL HISTORY:  Katrina Holland 60 y.o.  female pleasant patient above history of stage I breast cancer ER PR positive currently on adjuvant Arimidex is here for follow-up.  Patient continues to have chronic cough not any worse.  The symptoms not improved.  She is quite frustrated with her ongoing hoarseness of voice.  States that she had a rheumatology evaluation-abnormal titers.  But no conclusive diagnosis yet.  Patient complains of worsening pain in the midthoracic region.  8-9 on a scale of 10.  Radiated to the front.  Worse with movement.  No bladder bowel incontinence.  No weakness in the legs.  Review of Systems  Constitutional: Negative for chills, diaphoresis and weight loss.  HENT: Negative for nosebleeds and sore throat.   Eyes: Negative for double vision.  Respiratory: Negative for hemoptysis and wheezing.   Cardiovascular: Negative for palpitations, orthopnea and leg swelling.  Gastrointestinal: Negative for abdominal pain, blood in stool, constipation, diarrhea, heartburn, melena, nausea and vomiting.   Genitourinary: Negative for dysuria, frequency and urgency.  Musculoskeletal: Positive for joint pain. Negative for back pain.  Skin: Negative.  Negative for itching and rash.  Neurological: Negative for dizziness, tingling, focal weakness, weakness and headaches.  Endo/Heme/Allergies: Does not bruise/bleed easily.  Psychiatric/Behavioral: Negative for depression. The patient is not nervous/anxious and does not have insomnia.       PAST MEDICAL HISTORY :  Past Medical History:  Diagnosis Date  . Anxiety   . Arthritis   . Asthma   . Breast CA ([place])   . Breast cancer ([place]) 2014   left breast  . Dyspnea    with dry cough x 7 years  . Endometriosis 2001  . GERD (gastroesophageal reflux disease)   . Headache    h/o migraines  . Heart murmur    asymptomatic  . Irritable bowel syndrome   . Malignant neoplasm of upper-outer quadrant of female breast ([place]) 01/30/2012   Left breast, Invasive mammary cancer, no special type: T1c,N0,M0; ER 90%, PR 90%, no amplification of HER-2/neu, aromatase inhibitors discontinued September 2015 secondary to joint symptoms, tamoxifen initiated by Dr. Ma Hillock..  . Personal history of chemotherapy   . Personal history of radiation therapy   . Pneumonia   . Pulmonary hypertension ([place]) 2013  . Sleep apnea 2007   does not use cpap   . Sleep apnea 2007    PAST SURGICAL HISTORY :   Past Surgical History:  Procedure Laterality Date  . BREAST BIOPSY Left 2013   neg,   . BREAST BIOPSY Left 01/14   invasive ductal carcinoma  .  BREAST BIOPSY Left 12/14   benign, done at Dr. Dwyane Luo office, [place]  . BREAST ENHANCEMENT SURGERY  2014,05/2013  . BREAST LUMPECTOMY Left 2014   invasive ductal carcinoma  . BREAST SURGERY Left 01/30/2012   Wide excision, sentinel node biopsy  . BREAST SURGERY Left 02/09/2012   Left breast reduction/reconstruction: Nicholaus Bloom, MD.  . BREAST SURGERY Left December 26, 2012   ultrasound-guided biopsy, post surgical change.   . COLONOSCOPY  2012  . DILATION AND CURETTAGE OF UTERUS  2011  . EXPLORATORY LAPAROTOMY  2001  . FACIAL COSMETIC SURGERY  2015  . FLEXIBLE BRONCHOSCOPY N/A 09/12/2018   Procedure: BRONCHOSCOPY, BRONCHOALVEOLAR LAVAGE, SLEEP APNEA;  Surgeon: Ottie Glazier, MD;  Location: [facility] ORS;  Service: Thoracic;  Laterality: N/A;  . [place]  . NISSEN FUNDOPLICATION  [id number]  . OVARY SURGERY Right 2001  . PORT A CATH REVISION  2014  . REDUCTION MAMMAPLASTY Left 2014  . REDUCTION MAMMAPLASTY Right 2015  . RIGHT HEART CATH  2013  . TONSILLECTOMY  1969    FAMILY HISTORY :   Family History  Problem Relation Age of Onset  . Colon cancer Paternal Grandfather   . Breast cancer Maternal Aunt 43  . Breast cancer Cousin 21  . Breast cancer Cousin 24  . Breast cancer Cousin 67    SOCIAL HISTORY:   Social History   Tobacco Use  . Smoking status: Former Smoker    Packs/day: 0.10    Years: 25.00    Pack years: 2.50    Types: Cigarettes    Quit date: 01/11/2007    Years since quitting: 13.1  . Smokeless tobacco: Never Used  . Tobacco comment: "social smoker" per pt.  Vaping Use  . Vaping Use: Never used  Substance Use Topics  . Alcohol use: No  . Drug use: No    ALLERGIES:  is allergic to augmentin [amoxicillin-pot clavulanate].  MEDICATIONS:  Current Outpatient Medications  Medication Sig Dispense Refill  . diclofenac (VOLTAREN) 75 MG EC tablet Take 75 mg by mouth 2 (two) times daily.     . montelukast (SINGULAIR) 10 MG tablet TAKE ONE TABLET BY MOUTH AT BEDTIME 30 tablet 0  . pravastatin (PRAVACHOL) 10 MG tablet Take 10 mg by mouth at bedtime.    Marland Kitchen venlafaxine (EFFEXOR) 75 MG tablet TAKE ONE TABLET BY MOUTH TWICE DAILY WITH MEALS (Patient taking differently: Take 75 mg by mouth every morning.) 60 tablet 3  . letrozole (FEMARA) 2.5 MG tablet Take 1 tablet (2.5 mg total) by mouth daily. 90 tablet 1   No current facility-administered medications for this visit.    Facility-Administered Medications Ordered in Other Visits  Medication Dose Route Frequency Provider Last Rate Last Admin  . Zoledronic Acid (ZOMETA) IVPB 4 mg  4 mg Intravenous Once Charlaine Dalton R, MD 400 mL/hr at 02/26/20 1433 4 mg at 02/26/20 1433    PHYSICAL EXAMINATION: ECOG PERFORMANCE STATUS: 0 - Asymptomatic  BP 117/60 (BP Location: Right Arm, Patient Position: Sitting)   Pulse 72   Temp (!) 96.4 F (35.8 C) (Tympanic)   Resp 20   Ht _0  (1.549 m)   Wt 186 lb 8 oz (84.6 kg)   SpO2 100%   BMI 35.24 kg/m   Filed Weights   02/26/20 1327  Weight: 186 lb 8 oz (84.6 kg)    Physical Exam HENT:     Head: Normocephalic and atraumatic.     Mouth/Throat:     Pharynx:  No oropharyngeal exudate.  Eyes:     Pupils: Pupils are equal, round, and reactive to light.  Cardiovascular:     Rate and Rhythm: Normal rate and regular rhythm.  Pulmonary:     Effort: No respiratory distress.     Breath sounds: No wheezing.  Abdominal:     General: Bowel sounds are normal. There is no distension.     Palpations: Abdomen is soft. There is no mass.     Tenderness: There is no abdominal tenderness. There is no guarding or rebound.  Musculoskeletal:        General: No tenderness. Normal range of motion.     Cervical back: Normal range of motion and neck supple.  Skin:    General: Skin is warm.     Comments:     Neurological:     Mental Status: She is alert and oriented to person, place, and time.  Psychiatric:        Mood and Affect: Affect normal.     LABORATORY DATA:  I have reviewed the data as listed    Component Value Date/Time   NA 141 02/26/2020 1309   NA 140 09/25/2013 1425   K 4.0 02/26/2020 1309   K 4.0 09/25/2013 1425   CL 105 02/26/2020 1309   CL 103 09/25/2013 1425   CO2 29 02/26/2020 1309   CO2 29 09/25/2013 1425   GLUCOSE 95 02/26/2020 1309   GLUCOSE 92 09/25/2013 1425   BUN 16 02/26/2020 1309   BUN 17 09/25/2013 1425   CREATININE 0.62  02/26/2020 1309   CREATININE 0.87 09/25/2013 1425   CALCIUM 9.5 02/26/2020 1309   CALCIUM 10.1 09/25/2013 1425   PROT 6.8 08/15/2018 1247   PROT 7.2 09/25/2013 1425   ALBUMIN 4.0 08/15/2018 1247   ALBUMIN 3.9 09/25/2013 1425   AST 26 08/15/2018 1247   AST 30 09/25/2013 1425   ALT 38 08/15/2018 1247   ALT 55 09/25/2013 1425   ALKPHOS 82 08/15/2018 1247   ALKPHOS 154 (H) 09/25/2013 1425   BILITOT 0.6 08/15/2018 1247   BILITOT 0.4 09/25/2013 1425   GFRNONAA >60 02/26/2020 1309   GFRNONAA >60 09/25/2013 1425   GFRAA >60 08/21/2019 1315   GFRAA >60 09/25/2013 1425    No results found for: SPEP, UPEP  Lab Results  Component Value Date   WBC 6.3 02/26/2020   NEUTROABS 3.1 02/26/2020   HGB 13.0 02/26/2020   HCT 38.0 02/26/2020   MCV 92.0 02/26/2020   PLT 268 02/26/2020      Chemistry      Component Value Date/Time   NA 141 02/26/2020 1309   NA 140 09/25/2013 1425   K 4.0 02/26/2020 1309   K 4.0 09/25/2013 1425   CL 105 02/26/2020 1309   CL 103 09/25/2013 1425   CO2 29 02/26/2020 1309   CO2 29 09/25/2013 1425   BUN 16 02/26/2020 1309   BUN 17 09/25/2013 1425   CREATININE 0.62 02/26/2020 1309   CREATININE 0.87 09/25/2013 1425      Component Value Date/Time   CALCIUM 9.5 02/26/2020 1309   CALCIUM 10.1 09/25/2013 1425   ALKPHOS 82 08/15/2018 1247   ALKPHOS 154 (H) 09/25/2013 1425   AST 26 08/15/2018 1247   AST 30 09/25/2013 1425   ALT 38 08/15/2018 1247   ALT 55 09/25/2013 1425   BILITOT 0.6 08/15/2018 1247   BILITOT 0.4 09/25/2013 1425       RADIOGRAPHIC STUDIES: I have personally reviewed the radiological  images as listed and agreed with the findings in the report. No results found.   ASSESSMENT & PLAN:  Malignant neoplasm of overlapping sites of left breast in female, estrogen receptor positive ([place]) # Stage I ER/PR positive HER-2/neu positive breast cancer currently on adjuvant femara.  Stable.  No clinical evidence of recurrence. Tolerating well;  continue Letrozole mammo- sep 2021- WNL.   # low thoracic Back pain-radiating to front- since May 2021. Aug 2021-CT scan reviewed no evidence of any lesions noted.  # Chronic cough/dyspnea/[place] [Dr. A/[facility]]-extensive work-up including bronchoscopy Negative- STABLE.  Defer to pulmonary.  # Vocal cord paralysis/hoarseness of voice-? etiology [[facility]] ? Rheumatology-titers-positive.  Defer to ENT/rheumatology.  # Hot flashes- grade 1 STABLE.   # Osteopenia-OCT 2021- Neck Left is 0.872 g/cm2 with a T-score of -1.2. On zometa x every 6 months.  STABLE.   # DISPOSITION: # MRI thoracic spine ASAP # Zometa today # Follow up in 6 months; MD;  cbc/bmp; zometa-Dr.B  Cc: A   Orders Placed This Encounter  Procedures  . MR Thoracic Spine W Wo Contrast    Standing Status:   Future    Standing Expiration Date:   02/25/2021    Order Specific Question:   GRA to provide read?    Answer:   Yes    Order Specific Question:   If indicated for the ordered procedure, I authorize the administration of contrast media per Radiology protocol    Answer:   Yes    Order Specific Question:   What is the patient's sedation requirement?    Answer:   No Sedation    Order Specific Question:   Use SRS Protocol?    Answer:   No    Order Specific Question:   Does the patient have a pacemaker or implanted devices?    Answer:   No    Order Specific Question:   Preferred imaging location?    Answer:   [facility] (table limit - 550lbs)  . CBC with Differential/Platelet    Standing Status:   Future    Standing Expiration Date:   02/25/2021  . Basic metabolic panel    Standing Status:   Future    Standing Expiration Date:   02/25/2021   All questions were answered. The patient knows to call the clinic with any problems, questions or concerns.      Cammie Sickle, MD 02/26/2020 2:39 PM

## 2020-03-07 ENCOUNTER — Ambulatory Visit
Admission: RE | Admit: 2020-03-07 | Discharge: 2020-03-07 | Disposition: A | Discharge status: Home / Self Care | Payer: Federal, State, Local not specified - PPO | Source: Ambulatory Visit | Attending: Internal Medicine | Admitting: Internal Medicine

## 2020-03-07 ENCOUNTER — Other Ambulatory Visit: Payer: Self-pay

## 2020-03-07 DIAGNOSIS — Z170 Estrogen receptor positive status [ER+]: Secondary | ICD-10-CM | POA: Diagnosis present

## 2020-03-07 DIAGNOSIS — C50812 Malignant neoplasm of overlapping sites of left female breast: Secondary | ICD-10-CM | POA: Insufficient documentation

## 2020-03-07 DIAGNOSIS — M549 Dorsalgia, unspecified: Secondary | ICD-10-CM | POA: Diagnosis present

## 2020-03-07 MED ORDER — GADOBUTROL 1 MMOL/ML IV SOLN
8.0000 mL | Freq: Once | INTRAVENOUS | Status: AC | PRN
  Administered 2020-03-07: 8 mL via INTRAVENOUS

## 2020-03-19 ENCOUNTER — Telehealth: Payer: Self-pay | Admitting: Internal Medicine

## 2020-03-19 NOTE — Telephone Encounter (Signed)
On 3/10- I reviewed with the patient the results of the MRI thoracic spine; cervical spine degeneration/progressive arthritis noted.  Recommend evaluation with orthopedics; patient seen orthopedics previously.  No obvious explanation for her thoracic pain.   Otherwise follow-up as planned.  GB

## 2020-08-17 ENCOUNTER — Telehealth: Payer: Self-pay | Admitting: *Deleted

## 2020-08-17 MED ORDER — LETROZOLE 2.5 MG PO TABS: 2.5000 mg | ORAL_TABLET | Freq: Every day | ORAL | 0 refills | Status: DC

## 2020-08-17 NOTE — Telephone Encounter (Signed)
Refill request from pharmacy for patient Letrozole. Patient has appointment 08/26/20 for follow up, she started therapy in Nov 2017 for Stage I breast cancer. Do yo want to refill this for #90 tabs?

## 2020-08-21 ENCOUNTER — Other Ambulatory Visit: Payer: Self-pay | Admitting: Internal Medicine

## 2020-08-21 DIAGNOSIS — Z1231 Encounter for screening mammogram for malignant neoplasm of breast: Secondary | ICD-10-CM

## 2020-08-26 ENCOUNTER — Inpatient Hospital Stay: Payer: Federal, State, Local not specified - PPO | Attending: Internal Medicine

## 2020-08-26 ENCOUNTER — Inpatient Hospital Stay: Payer: Federal, State, Local not specified - PPO

## 2020-08-26 ENCOUNTER — Encounter: Payer: Self-pay | Admitting: Internal Medicine

## 2020-08-26 ENCOUNTER — Inpatient Hospital Stay (HOSPITAL_BASED_OUTPATIENT_CLINIC_OR_DEPARTMENT_OTHER): Payer: Federal, State, Local not specified - PPO | Admitting: Internal Medicine

## 2020-08-26 ENCOUNTER — Other Ambulatory Visit: Payer: Self-pay

## 2020-08-26 DIAGNOSIS — C50812 Malignant neoplasm of overlapping sites of left female breast: Secondary | ICD-10-CM | POA: Insufficient documentation

## 2020-08-26 DIAGNOSIS — E669 Obesity, unspecified: Secondary | ICD-10-CM | POA: Diagnosis not present

## 2020-08-26 DIAGNOSIS — Z803 Family history of malignant neoplasm of breast: Secondary | ICD-10-CM | POA: Diagnosis not present

## 2020-08-26 DIAGNOSIS — M25552 Pain in left hip: Secondary | ICD-10-CM | POA: Diagnosis not present

## 2020-08-26 DIAGNOSIS — M546 Pain in thoracic spine: Secondary | ICD-10-CM | POA: Insufficient documentation

## 2020-08-26 DIAGNOSIS — M25551 Pain in right hip: Secondary | ICD-10-CM | POA: Insufficient documentation

## 2020-08-26 DIAGNOSIS — Z79811 Long term (current) use of aromatase inhibitors: Secondary | ICD-10-CM | POA: Insufficient documentation

## 2020-08-26 DIAGNOSIS — R232 Flushing: Secondary | ICD-10-CM | POA: Diagnosis not present

## 2020-08-26 DIAGNOSIS — Z170 Estrogen receptor positive status [ER+]: Secondary | ICD-10-CM | POA: Insufficient documentation

## 2020-08-26 DIAGNOSIS — Z79899 Other long term (current) drug therapy: Secondary | ICD-10-CM | POA: Insufficient documentation

## 2020-08-26 DIAGNOSIS — F17210 Nicotine dependence, cigarettes, uncomplicated: Secondary | ICD-10-CM | POA: Diagnosis not present

## 2020-08-26 DIAGNOSIS — N951 Menopausal and female climacteric states: Secondary | ICD-10-CM | POA: Diagnosis not present

## 2020-08-26 DIAGNOSIS — M8580 Other specified disorders of bone density and structure, unspecified site: Secondary | ICD-10-CM | POA: Diagnosis not present

## 2020-08-26 LAB — CBC WITH DIFFERENTIAL/PLATELET
Abs Immature Granulocytes: 0.01 10*3/uL (ref 0.00–0.07)
Basophils Absolute: 0 10*3/uL (ref 0.0–0.1)
Basophils Relative: 1 %
Eosinophils Absolute: 0.1 10*3/uL (ref 0.0–0.5)
Eosinophils Relative: 1 %
HCT: 39.9 % (ref 36.0–46.0)
Hemoglobin: 13.6 g/dL (ref 12.0–15.0)
Immature Granulocytes: 0 %
Lymphocytes Relative: 38 %
Lymphs Abs: 2.1 10*3/uL (ref 0.7–4.0)
MCH: 31.8 pg (ref 26.0–34.0)
MCHC: 34.1 g/dL (ref 30.0–36.0)
MCV: 93.2 fL (ref 80.0–100.0)
Monocytes Absolute: 0.5 10*3/uL (ref 0.1–1.0)
Monocytes Relative: 9 %
Neutro Abs: 2.7 10*3/uL (ref 1.7–7.7)
Neutrophils Relative %: 51 %
Platelets: 277 10*3/uL (ref 150–400)
RBC: 4.28 MIL/uL (ref 3.87–5.11)
RDW: 12.4 % (ref 11.5–15.5)
WBC: 5.4 10*3/uL (ref 4.0–10.5)
nRBC: 0 % (ref 0.0–0.2)

## 2020-08-26 LAB — BASIC METABOLIC PANEL
Anion gap: 10 (ref 5–15)
BUN: 20 mg/dL (ref 6–20)
CO2: 21 mmol/L — ABNORMAL LOW (ref 22–32)
Calcium: 9 mg/dL (ref 8.9–10.3)
Chloride: 103 mmol/L (ref 98–111)
Creatinine, Ser: 0.83 mg/dL (ref 0.44–1.00)
GFR, Estimated: >60 mL/min (ref >60)
Glucose, Bld: 97 mg/dL (ref 70–99)
Potassium: 3.9 mmol/L (ref 3.5–5.1)
Sodium: 134 mmol/L — ABNORMAL LOW (ref 135–145)

## 2020-08-26 MED ORDER — ZOLEDRONIC ACID 4 MG/100ML IV SOLN
4.0000 mg | Freq: Once | INTRAVENOUS | Status: AC
  Administered 2020-08-26: 4 mg via INTRAVENOUS
  Filled 2020-08-26: qty 100

## 2020-08-26 MED ORDER — SODIUM CHLORIDE 0.9 % IV SOLN
Freq: Once | INTRAVENOUS | Status: AC
  Filled 2020-08-26: qty 250

## 2020-08-26 NOTE — Patient Instructions (Signed)

## 2020-08-26 NOTE — Patient Instructions (Signed)
#   HOLD off Femara x next 3 months.

## 2020-08-26 NOTE — Progress Notes (Signed)
[place] OFFICE PROGRESS NOTE  Patient Care Team: Kirk Ruths, MD as PCP - General (Internal Medicine) Bary Castilla, Forest Gleason, MD (General Surgery) Dear, [name], MD (Inactive) (Family Medicine) Cammie Sickle, MD as Consulting Physician (Hematology and Oncology)  Cancer Staging No matching staging information was found for the patient.   Oncology History Overview Note  # JAN 2014- LEFT BREAST CA IDC; pT1c (1.2cm) pN0 [Stage I] ER/PR > 90%; her 2 Neu POS; AC x4- Taxol-Herceptin; Intol to AI; March 2015- Started TAM [stopped- Sep 2017]; OCT 2017- Post menopausal [estradiol/FSH]; NOV 1st 2017- Start ARIMIDEX  # DEC 2016- Start Effexor   # Right oopherectomy; Left intact ovary-intact [last periods late 90s]  DIAGNOSIS:Breast cancer  STAGE:  I       ;GOALS: cure  CURRENT/MOST RECENT THERAPY: anastrazole    Malignant neoplasm of overlapping sites of left breast in female, estrogen receptor positive ([place])      INTERVAL HISTORY:  Katrina Holland 60 y.o.  female pleasant patient above history of stage I breast cancer ER PR positive currently on adjuvant Femara is here for follow-up.   Patient notes to have bilateral hip pain.  Also upper back pain.  Patient is motivated to lose weight.  She has been losing some weight.  Intentional. Patient denies any new symptoms of cough or shortness of breath or chest pain.   Review of Systems  Constitutional:  Negative for chills, diaphoresis and weight loss.  HENT:  Negative for nosebleeds and sore throat.   Eyes:  Negative for double vision.  Respiratory:  Negative for hemoptysis and wheezing.   Cardiovascular:  Negative for palpitations, orthopnea and leg swelling.  Gastrointestinal:  Negative for abdominal pain, blood in stool, constipation, diarrhea, heartburn, melena, nausea and vomiting.  Genitourinary:  Negative for dysuria, frequency and urgency.  Musculoskeletal:  Positive for joint pain. Negative for  back pain.  Skin: Negative.  Negative for itching and rash.  Neurological:  Negative for dizziness, tingling, focal weakness, weakness and headaches.  Endo/Heme/Allergies:  Does not bruise/bleed easily.  Psychiatric/Behavioral:  Negative for depression. The patient is not nervous/anxious and does not have insomnia.      PAST MEDICAL HISTORY :  Past Medical History:  Diagnosis Date  . Anxiety   . Arthritis   . Asthma   . Breast CA ([place])   . Breast cancer ([place]) 2014   left breast  . Dyspnea    with dry cough x 7 years  . Endometriosis 2001  . GERD (gastroesophageal reflux disease)   . Headache    h/o migraines  . Heart murmur    asymptomatic  . Irritable bowel syndrome   . Malignant neoplasm of upper-outer quadrant of female breast ([place]) 01/30/2012   Left breast, Invasive mammary cancer, no special type: T1c,N0,M0; ER 90%, PR 90%, no amplification of HER-2/neu, aromatase inhibitors discontinued September 2015 secondary to joint symptoms, tamoxifen initiated by Dr. Ma Hillock..  . Personal history of chemotherapy   . Personal history of radiation therapy   . Pneumonia   . Pulmonary hypertension ([place]) 2013  . Sleep apnea 2007   does not use cpap   . Sleep apnea 2007    PAST SURGICAL HISTORY :   Past Surgical History:  Procedure Laterality Date  . BREAST BIOPSY Left 2013   neg,   . BREAST BIOPSY Left 01/14   invasive ductal carcinoma  . BREAST BIOPSY Left 12/14   benign, done at Dr. Dwyane Luo office, [place]  . BREAST ENHANCEMENT SURGERY  2014,05/2013  . BREAST LUMPECTOMY Left 2014   invasive ductal carcinoma  . BREAST SURGERY Left 01/30/2012   Wide excision, sentinel node biopsy  . BREAST SURGERY Left 02/09/2012   Left breast reduction/reconstruction: Nicholaus Bloom, MD.  . BREAST SURGERY Left December 26, 2012   ultrasound-guided biopsy, post surgical change.  . COLONOSCOPY  2012  . DILATION AND CURETTAGE OF UTERUS  2011  . EXPLORATORY LAPAROTOMY  2001  . FACIAL COSMETIC  SURGERY  2015  . FLEXIBLE BRONCHOSCOPY N/A 09/12/2018   Procedure: BRONCHOSCOPY, BRONCHOALVEOLAR LAVAGE, SLEEP APNEA;  Surgeon: Ottie Glazier, MD;  Location: [facility] ORS;  Service: Thoracic;  Laterality: N/A;  . [place]  . NISSEN FUNDOPLICATION  1884  . OVARY SURGERY Right 2001  . PORT A CATH REVISION  2014  . REDUCTION MAMMAPLASTY Left 2014  . REDUCTION MAMMAPLASTY Right 2015  . RIGHT HEART CATH  2013  . TONSILLECTOMY  1969    FAMILY HISTORY :   Family History  Problem Relation Age of Onset  . Colon cancer Paternal Grandfather   . Breast cancer Maternal Aunt 26  . Breast cancer Cousin 7  . Breast cancer Cousin 64  . Breast cancer Cousin 26    SOCIAL HISTORY:   Social History   Tobacco Use  . Smoking status: Former    Packs/day: 0.10    Years: 25.00    Pack years: 2.50    Types: Cigarettes    Quit date: 01/11/2007    Years since quitting: 13.6  . Smokeless tobacco: Never  . Tobacco comments:    "social smoker" per pt.  Vaping Use  . Vaping Use: Never used  Substance Use Topics  . Alcohol use: No  . Drug use: No    ALLERGIES:  is allergic to augmentin [amoxicillin-pot clavulanate].  MEDICATIONS:  Current Outpatient Medications  Medication Sig Dispense Refill  . buPROPion (WELLBUTRIN XL) 150 MG 24 hr tablet Take 150 mg by mouth daily.    . diclofenac (VOLTAREN) 75 MG EC tablet Take 75 mg by mouth 2 (two) times daily.     Marland Kitchen letrozole (FEMARA) 2.5 MG tablet Take 1 tablet (2.5 mg total) by mouth daily. 90 tablet 0  . montelukast (SINGULAIR) 10 MG tablet TAKE ONE TABLET BY MOUTH AT BEDTIME 30 tablet 0  . pravastatin (PRAVACHOL) 10 MG tablet Take 10 mg by mouth at bedtime.    . topiramate (TOPAMAX) 25 MG tablet Take 25 mg by mouth 2 (two) times daily.    Marland Kitchen venlafaxine (EFFEXOR) 75 MG tablet TAKE ONE TABLET BY MOUTH TWICE DAILY WITH MEALS (Patient taking differently: Take 75 mg by mouth every morning.) 60 tablet 3   No current facility-administered medications  for this visit.    PHYSICAL EXAMINATION: ECOG PERFORMANCE STATUS: 0 - Asymptomatic  BP 137/85   Pulse 82   Temp (!) 96.5 F (35.8 C) (Tympanic)   Resp 20   Ht _0  (1.549 m)   Wt 183 lb (83 kg)   BMI 34.58 kg/m   Filed Weights   08/26/20 1314  Weight: 183 lb (83 kg)    Physical Exam HENT:     Head: Normocephalic and atraumatic.     Mouth/Throat:     Pharynx: No oropharyngeal exudate.  Eyes:     Pupils: Pupils are equal, round, and reactive to light.  Cardiovascular:     Rate and Rhythm: Normal rate and regular rhythm.  Pulmonary:  Effort: No respiratory distress.     Breath sounds: No wheezing.  Abdominal:     General: Bowel sounds are normal. There is no distension.     Palpations: Abdomen is soft. There is no mass.     Tenderness: There is no abdominal tenderness. There is no guarding or rebound.  Musculoskeletal:        General: No tenderness. Normal range of motion.     Cervical back: Normal range of motion and neck supple.  Skin:    General: Skin is warm.     Comments:     Neurological:     Mental Status: She is alert and oriented to person, place, and time.  Psychiatric:        Mood and Affect: Affect normal.    LABORATORY DATA:  I have reviewed the data as listed    Component Value Date/Time   NA 134 (L) 08/26/2020 1301   NA 140 09/25/2013 1425   K 3.9 08/26/2020 1301   K 4.0 09/25/2013 1425   CL 103 08/26/2020 1301   CL 103 09/25/2013 1425   CO2 21 (L) 08/26/2020 1301   CO2 29 09/25/2013 1425   GLUCOSE 97 08/26/2020 1301   GLUCOSE 92 09/25/2013 1425   BUN 20 08/26/2020 1301   BUN 17 09/25/2013 1425   CREATININE 0.83 08/26/2020 1301   CREATININE 0.87 09/25/2013 1425   CALCIUM 9.0 08/26/2020 1301   CALCIUM 10.1 09/25/2013 1425   PROT 6.8 08/15/2018 1247   PROT 7.2 09/25/2013 1425   ALBUMIN 4.0 08/15/2018 1247   ALBUMIN 3.9 09/25/2013 1425   AST 26 08/15/2018 1247   AST 30 09/25/2013 1425   ALT 38 08/15/2018 1247   ALT 55  09/25/2013 1425   ALKPHOS 82 08/15/2018 1247   ALKPHOS 154 (H) 09/25/2013 1425   BILITOT 0.6 08/15/2018 1247   BILITOT 0.4 09/25/2013 1425   GFRNONAA >60 08/26/2020 1301   GFRNONAA >60 09/25/2013 1425   GFRAA >60 08/21/2019 1315   GFRAA >60 09/25/2013 1425    No results found for: SPEP, UPEP  Lab Results  Component Value Date   WBC 5.4 08/26/2020   NEUTROABS 2.7 08/26/2020   HGB 13.6 08/26/2020   HCT 39.9 08/26/2020   MCV 93.2 08/26/2020   PLT 277 08/26/2020      Chemistry      Component Value Date/Time   NA 134 (L) 08/26/2020 1301   NA 140 09/25/2013 1425   K 3.9 08/26/2020 1301   K 4.0 09/25/2013 1425   CL 103 08/26/2020 1301   CL 103 09/25/2013 1425   CO2 21 (L) 08/26/2020 1301   CO2 29 09/25/2013 1425   BUN 20 08/26/2020 1301   BUN 17 09/25/2013 1425   CREATININE 0.83 08/26/2020 1301   CREATININE 0.87 09/25/2013 1425      Component Value Date/Time   CALCIUM 9.0 08/26/2020 1301   CALCIUM 10.1 09/25/2013 1425   ALKPHOS 82 08/15/2018 1247   ALKPHOS 154 (H) 09/25/2013 1425   AST 26 08/15/2018 1247   AST 30 09/25/2013 1425   ALT 38 08/15/2018 1247   ALT 55 09/25/2013 1425   BILITOT 0.6 08/15/2018 1247   BILITOT 0.4 09/25/2013 1425       RADIOGRAPHIC STUDIES: I have personally reviewed the radiological images as listed and agreed with the findings in the report. No results found.   ASSESSMENT & PLAN:  Malignant neoplasm of overlapping sites of left breast in female, estrogen receptor positive ([place]) #  Stage I ER/PR positive HER-2/neu positive breast cancer currently on adjuvant femara.  Stable.  No clinical evidence of recurrence. Tolerating well except for continued bilateral rib pain/back pain-question Femara versus others.  Hold Femara for 3 months.  Mammo- sep 2021- WNL.    # Bil rib pain/ low thoracic Back pain-s/p [pain clinic]; MRI spine- NEGATIVE- ? [facility] from [place].hold Femara.  See above.  # Hot flashes- grade 1 STABLE.   # Osteopenia-OCT 2021-  Neck Left is 0.872 g/cm2 with a T-score of -1.2. On zometa x every 6 months.  Stable.  # Obesity:  Discussed importance of healthy weight/and weight loss.  Strongly recommend eating more green leafy vegetables and cutting down processed food/ carbohydrates.  Instead increasing whole grains / protein in the diet.  Multiple studies have shown that optimal weight would help improve cardiovascular risk; also shown to cut on the risk of malignancies-colon cancer, breast cancer ovarian/uterine cancer in women and also prostate cancer in men. Pt is motivated-and already has lost weight.  # DISPOSITION: # Zometa today # Follow up in 3 months; MD-Dr.B  Cc: A   No orders of the defined types were placed in this encounter.  All questions were answered. The patient knows to call the clinic with any problems, questions or concerns.      Cammie Sickle, MD 09/06/2020 10:25 PM

## 2020-08-26 NOTE — Assessment & Plan Note (Addendum)
#  Stage I ER/PR positive HER-2/neu positive breast cancer currently on adjuvant femara.  Stable.  No clinical evidence of recurrence. Tolerating well except for continued bilateral rib pain/back pain-question Femara versus others.  Hold Femara for 3 months.  Mammo- sep 2021- WNL.    # Bil rib pain/ low thoracic Back pain-s/p [pain clinic]; MRI spine- NEGATIVE- ? [facility] from [facility].hold Femara.  See above.  # Hot flashes- grade 1 STABLE.   # Osteopenia-OCT 2021- Neck Left is 0.872 g/cm2 with a T-score of -1.2. On zometa x every 6 months.  Stable.  # Obesity:  Discussed importance of healthy weight/and weight loss.  Strongly recommend eating more green leafy vegetables and cutting down processed food/ carbohydrates.  Instead increasing whole grains / protein in the diet.  Multiple studies have shown that optimal weight would help improve cardiovascular risk; also shown to cut on the risk of malignancies-colon cancer, breast cancer ovarian/uterine cancer in women and also prostate cancer in men. Pt is motivated-and already has lost weight.  # DISPOSITION: # Zometa today # Follow up in 3 months; MD-Dr.B  Cc: A 

## 2020-09-06 ENCOUNTER — Encounter: Payer: Self-pay | Admitting: Internal Medicine

## 2020-09-23 ENCOUNTER — Other Ambulatory Visit: Payer: Self-pay

## 2020-09-23 ENCOUNTER — Ambulatory Visit
Admission: RE | Admit: 2020-09-23 | Discharge: 2020-09-23 | Disposition: A | Discharge status: Home / Self Care | Payer: Federal, State, Local not specified - PPO | Source: Ambulatory Visit | Attending: Internal Medicine | Admitting: Internal Medicine

## 2020-09-23 DIAGNOSIS — Z1231 Encounter for screening mammogram for malignant neoplasm of breast: Secondary | ICD-10-CM | POA: Insufficient documentation

## 2020-11-25 ENCOUNTER — Other Ambulatory Visit: Payer: Self-pay

## 2020-11-25 ENCOUNTER — Inpatient Hospital Stay: Payer: Federal, State, Local not specified - PPO

## 2020-11-25 ENCOUNTER — Inpatient Hospital Stay: Payer: Federal, State, Local not specified - PPO | Attending: Internal Medicine | Admitting: Internal Medicine

## 2020-11-25 ENCOUNTER — Encounter: Payer: Self-pay | Admitting: Internal Medicine

## 2020-11-25 DIAGNOSIS — Z170 Estrogen receptor positive status [ER+]: Secondary | ICD-10-CM | POA: Diagnosis not present

## 2020-11-25 DIAGNOSIS — C50812 Malignant neoplasm of overlapping sites of left female breast: Secondary | ICD-10-CM | POA: Diagnosis not present

## 2020-11-25 DIAGNOSIS — Z923 Personal history of irradiation: Secondary | ICD-10-CM | POA: Diagnosis not present

## 2020-11-25 DIAGNOSIS — Z79899 Other long term (current) drug therapy: Secondary | ICD-10-CM | POA: Insufficient documentation

## 2020-11-25 DIAGNOSIS — Z79811 Long term (current) use of aromatase inhibitors: Secondary | ICD-10-CM | POA: Insufficient documentation

## 2020-11-25 DIAGNOSIS — Z23 Encounter for immunization: Secondary | ICD-10-CM | POA: Insufficient documentation

## 2020-11-25 DIAGNOSIS — Z87891 Personal history of nicotine dependence: Secondary | ICD-10-CM | POA: Insufficient documentation

## 2020-11-25 DIAGNOSIS — M8580 Other specified disorders of bone density and structure, unspecified site: Secondary | ICD-10-CM | POA: Diagnosis not present

## 2020-11-25 DIAGNOSIS — Z9221 Personal history of antineoplastic chemotherapy: Secondary | ICD-10-CM | POA: Insufficient documentation

## 2020-11-25 DIAGNOSIS — N951 Menopausal and female climacteric states: Secondary | ICD-10-CM | POA: Diagnosis not present

## 2020-11-25 DIAGNOSIS — R232 Flushing: Secondary | ICD-10-CM | POA: Insufficient documentation

## 2020-11-25 MED ORDER — INFLUENZA VAC SPLIT QUAD 0.5 ML IM SUSY
0.5000 mL | PREFILLED_SYRINGE | Freq: Once | INTRAMUSCULAR | Status: AC
  Administered 2020-11-25: 0.5 mL via INTRAMUSCULAR
  Filled 2020-11-25: qty 0.5

## 2020-11-25 NOTE — Assessment & Plan Note (Addendum)
#  Stage I ER/PR positive HER-2/neu positive breast cancer; currently on extended adjuvant Femara [until 2026].  However recommend discontinuation of further Femara given poor tolerance.  Mammogram 2022 September within normal limits.  # Bil rib pain/ low thoracic Back pain-s/p [pain clinic]; MRI spine- NEGATIVE- ? MSK from [place]. DISONTINUE Femara.  However not complete resolved.  Recommend acupuncture; Dr.Naylor.   # rash- ? Medication- resolved.   # Hot flashes- grade 1 STABLE.   # Osteopenia-OCT 2021- Neck Left is 0.872 g/cm2 with a T-score of -1.2. On zometa x every 6 months- STABLE;   # DISPOSITION: # Flu shot # Follow up in 6 months; MD; labs- cbc/cmp;Zometa-Dr.B  Cc: A

## 2020-11-25 NOTE — Progress Notes (Signed)
After being off Letrozole for 3 weeks and he started having a rash of tiny blisters that were itchy.  The rash started on her upper body then moved to her legs.  The symptoms lasted lasted 4 weeks.

## 2020-11-25 NOTE — Progress Notes (Signed)
[facility] OFFICE PROGRESS NOTE  Patient Care Team: Kirk Ruths, MD as PCP - General (Internal Medicine) Bary Castilla, Forest Gleason, MD (General Surgery) Dear, [name], MD (Inactive) (Family Medicine) Cammie Sickle, MD as Consulting Physician (Hematology and Oncology)  Cancer Staging No matching staging information was found for the patient.   Oncology History Overview Note  # JAN 2014- LEFT BREAST CA IDC; pT1c (1.2cm) pN0 [Stage I] ER/PR > 90%; her 2 Neu POS; AC x4- Taxol-Herceptin; Intol to AI; March 2015- Started TAM [stopped- Sep 2017]; OCT 2017- Post menopausal [estradiol/FSH]; NOV 1st 2017- Start ARIMIDEX; STOPPED [facility]- summer 2022 [? MSK]  # [place] 2016- Start Effexor   # Right oopherectomy; Left intact ovary-intact [last periods late 90s]  DIAGNOSIS:Breast cancer  STAGE:  I       ;GOALS: cure  CURRENT/MOST RECENT THERAPY: anastrazole    Malignant neoplasm of overlapping sites of left breast in female, estrogen receptor positive ([place])      INTERVAL HISTORY:  Katrina Holland 60 y.o.  female pleasant patient above history of stage I breast cancer ER PR positive currently on adjuvant Femara is here for follow-up.  However Femara on hold for the last 3 months given the ongoing musculoskeletal aches and pains.  Patient did not notice any significant improvement of the aches and pains.  Continues to complain of fatigue.  Noted to have a rash-Wellbutrin versus Topamax.  Currently resolved-off medications.  Gaining weight.   Review of Systems  Constitutional:  Positive for malaise/fatigue. Negative for chills, diaphoresis and weight loss.  HENT:  Negative for nosebleeds and sore throat.   Eyes:  Negative for double vision.  Respiratory:  Negative for hemoptysis and wheezing.   Cardiovascular:  Negative for palpitations, orthopnea and leg swelling.  Gastrointestinal:  Negative for abdominal pain, blood in stool, constipation, diarrhea, heartburn,  melena, nausea and vomiting.  Genitourinary:  Negative for dysuria, frequency and urgency.  Musculoskeletal:  Positive for joint pain. Negative for back pain.  Skin: Negative.  Negative for itching and rash.  Neurological:  Negative for dizziness, tingling, focal weakness, weakness and headaches.  Endo/Heme/Allergies:  Does not bruise/bleed easily.  Psychiatric/Behavioral:  Negative for depression. The patient is not nervous/anxious and does not have insomnia.      PAST MEDICAL HISTORY :  Past Medical History:  Diagnosis Date   Anxiety    Arthritis    Asthma    Breast CA ([place])    Breast cancer ([place]) 2014   left breast   Dyspnea    with dry cough x 7 years   Endometriosis 2001   GERD (gastroesophageal reflux disease)    Headache    h/o migraines   Heart murmur    asymptomatic   Irritable bowel syndrome    Malignant neoplasm of upper-outer quadrant of female breast ([place]) 01/30/2012   Left breast, Invasive mammary cancer, no special type: T1c,N0,M0; ER 90%, PR 90%, no amplification of HER-2/neu, aromatase inhibitors discontinued September 2015 secondary to joint symptoms, tamoxifen initiated by Dr. Ma Hillock..   Personal history of chemotherapy    Personal history of radiation therapy    Pneumonia    Pulmonary hypertension ([place]) 2013   Sleep apnea 2007   does not use cpap    Sleep apnea 2007    PAST SURGICAL HISTORY :   Past Surgical History:  Procedure Laterality Date   BREAST BIOPSY Left 2013   neg,    BREAST BIOPSY Left 01/14   invasive ductal  carcinoma   BREAST BIOPSY Left 12/14   benign, done at Dr. Dwyane Luo office, [place]   BREAST ENHANCEMENT SURGERY  2014,05/2013   BREAST LUMPECTOMY Left 2014   invasive ductal carcinoma   BREAST SURGERY Left 01/30/2012   Wide excision, sentinel node biopsy   BREAST SURGERY Left 02/09/2012   Left breast reduction/reconstruction: Nicholaus Bloom, MD.   BREAST SURGERY Left December 26, 2012   ultrasound-guided biopsy, post surgical  change.   COLONOSCOPY  2012   DILATION AND CURETTAGE OF UTERUS  2011   EXPLORATORY LAPAROTOMY  2001   FACIAL COSMETIC SURGERY  2015   FLEXIBLE BRONCHOSCOPY N/A 09/12/2018   Procedure: BRONCHOSCOPY, BRONCHOALVEOLAR LAVAGE, SLEEP APNEA;  Surgeon: Ottie Glazier, MD;  Location: [facility] ORS;  Service: Thoracic;  Laterality: N/A;   HERNIA REPAIR  1610   NISSEN FUNDOPLICATION  9604   OVARY SURGERY Right 2001   PORT A CATH REVISION  2014   REDUCTION MAMMAPLASTY Left 2014   REDUCTION MAMMAPLASTY Right 2015   RIGHT HEART CATH  2013   TONSILLECTOMY  1969    FAMILY HISTORY :   Family History  Problem Relation Age of Onset   Colon cancer Paternal Grandfather    Breast cancer Maternal Aunt 55   Breast cancer Cousin 35   Breast cancer Cousin 35   Breast cancer Cousin 35    SOCIAL HISTORY:   Social History   Tobacco Use   Smoking status: Former    Packs/day: 0.10    Years: 25.00    Pack years: 2.50    Types: Cigarettes    Quit date: 01/11/2007    Years since quitting: 13.8   Smokeless tobacco: Never   Tobacco comments:    "social smoker" per pt.  Vaping Use   Vaping Use: Never used  Substance Use Topics   Alcohol use: No   Drug use: No    ALLERGIES:  is allergic to augmentin [amoxicillin-pot clavulanate].  MEDICATIONS:  Current Outpatient Medications  Medication Sig Dispense Refill   diclofenac (VOLTAREN) 75 MG EC tablet Take 75 mg by mouth 2 (two) times daily.      montelukast (SINGULAIR) 10 MG tablet TAKE ONE TABLET BY MOUTH AT BEDTIME 30 tablet 0   pravastatin (PRAVACHOL) 10 MG tablet Take 10 mg by mouth at bedtime.     venlafaxine (EFFEXOR) 75 MG tablet TAKE ONE TABLET BY MOUTH TWICE DAILY WITH MEALS (Patient taking differently: Take 75 mg by mouth every morning.) 60 tablet 3   buPROPion (WELLBUTRIN XL) 150 MG 24 hr tablet Take 150 mg by mouth daily. (Patient not taking: Reported on 11/25/2020)     letrozole (FEMARA) 2.5 MG tablet Take 1 tablet (2.5 mg total) by mouth daily.  (Patient not taking: Reported on 11/25/2020) 90 tablet 0   topiramate (TOPAMAX) 25 MG tablet Take 25 mg by mouth 2 (two) times daily. (Patient not taking: Reported on 11/25/2020)     No current facility-administered medications for this visit.    PHYSICAL EXAMINATION: ECOG PERFORMANCE STATUS: 0 - Asymptomatic  BP 129/76   Pulse 79   Temp (!) 97.5 F (36.4 C)   Resp 16   Wt 189 lb 11.2 oz (86 kg)   BMI 35.84 kg/m   Filed Weights   11/25/20 1452  Weight: 189 lb 11.2 oz (86 kg)    Physical Exam HENT:     Head: Normocephalic and atraumatic.     Mouth/Throat:     Pharynx: No oropharyngeal exudate.  Eyes:  Pupils: Pupils are equal, round, and reactive to light.  Cardiovascular:     Rate and Rhythm: Normal rate and regular rhythm.  Pulmonary:     Effort: No respiratory distress.     Breath sounds: No wheezing.  Abdominal:     General: Bowel sounds are normal. There is no distension.     Palpations: Abdomen is soft. There is no mass.     Tenderness: There is no abdominal tenderness. There is no guarding or rebound.  Musculoskeletal:        General: No tenderness. Normal range of motion.     Cervical back: Normal range of motion and neck supple.  Skin:    General: Skin is warm.     Comments:     Neurological:     Mental Status: She is alert and oriented to person, place, and time.  Psychiatric:        Mood and Affect: Affect normal.    LABORATORY DATA:  I have reviewed the data as listed    Component Value Date/Time   NA 134 (L) 08/26/2020 1301   NA 140 09/25/2013 1425   K 3.9 08/26/2020 1301   K 4.0 09/25/2013 1425   CL 103 08/26/2020 1301   CL 103 09/25/2013 1425   CO2 21 (L) 08/26/2020 1301   CO2 29 09/25/2013 1425   GLUCOSE 97 08/26/2020 1301   GLUCOSE 92 09/25/2013 1425   BUN 20 08/26/2020 1301   BUN 17 09/25/2013 1425   CREATININE 0.83 08/26/2020 1301   CREATININE 0.87 09/25/2013 1425   CALCIUM 9.0 08/26/2020 1301   CALCIUM 10.1 09/25/2013 1425    PROT 6.8 08/15/2018 1247   PROT 7.2 09/25/2013 1425   ALBUMIN 4.0 08/15/2018 1247   ALBUMIN 3.9 09/25/2013 1425   AST 26 08/15/2018 1247   AST 30 09/25/2013 1425   ALT 38 08/15/2018 1247   ALT 55 09/25/2013 1425   ALKPHOS 82 08/15/2018 1247   ALKPHOS 154 (H) 09/25/2013 1425   BILITOT 0.6 08/15/2018 1247   BILITOT 0.4 09/25/2013 1425   GFRNONAA >60 08/26/2020 1301   GFRNONAA >60 09/25/2013 1425   GFRAA >60 08/21/2019 1315   GFRAA >60 09/25/2013 1425    No results found for: SPEP, UPEP  Lab Results  Component Value Date   WBC 5.4 08/26/2020   NEUTROABS 2.7 08/26/2020   HGB 13.6 08/26/2020   HCT 39.9 08/26/2020   MCV 93.2 08/26/2020   PLT 277 08/26/2020      Chemistry      Component Value Date/Time   NA 134 (L) 08/26/2020 1301   NA 140 09/25/2013 1425   K 3.9 08/26/2020 1301   K 4.0 09/25/2013 1425   CL 103 08/26/2020 1301   CL 103 09/25/2013 1425   CO2 21 (L) 08/26/2020 1301   CO2 29 09/25/2013 1425   BUN 20 08/26/2020 1301   BUN 17 09/25/2013 1425   CREATININE 0.83 08/26/2020 1301   CREATININE 0.87 09/25/2013 1425      Component Value Date/Time   CALCIUM 9.0 08/26/2020 1301   CALCIUM 10.1 09/25/2013 1425   ALKPHOS 82 08/15/2018 1247   ALKPHOS 154 (H) 09/25/2013 1425   AST 26 08/15/2018 1247   AST 30 09/25/2013 1425   ALT 38 08/15/2018 1247   ALT 55 09/25/2013 1425   BILITOT 0.6 08/15/2018 1247   BILITOT 0.4 09/25/2013 1425       RADIOGRAPHIC STUDIES: I have personally reviewed the radiological images as listed and agreed with  the findings in the report. No results found.   ASSESSMENT & PLAN:  Malignant neoplasm of overlapping sites of left breast in female, estrogen receptor positive ([place]) # Stage I ER/PR positive HER-2/neu positive breast cancer; currently on extended adjuvant Femara [until 2026].  However recommend discontinuation of further Femara given poor tolerance.  Mammogram 2022 September within normal limits.  # Bil rib pain/ low  thoracic Back pain-s/p [pain clinic]; MRI spine- NEGATIVE- ? MSK from [place]. DISONTINUE Femara.  However not complete resolved.  Recommend acupuncture; Dr.Naylor.   # rash- ? Medication- resolved.   # Hot flashes- grade 1 STABLE.   # Osteopenia-OCT 2021- Neck Left is 0.872 g/cm2 with a T-score of -1.2. On zometa x every 6 months- STABLE;   # DISPOSITION: # Flu shot # Follow up in 6 months; MD; labs- cbc/cmp;Zometa-Dr.B  Cc: A   Orders Placed This Encounter  Procedures   CBC with Differential/Platelet    Standing Status:   Future    Standing Expiration Date:   11/25/2021   Comprehensive metabolic panel    Standing Status:   Future    Standing Expiration Date:   11/25/2021    All questions were answered. The patient knows to call the clinic with any problems, questions or concerns.      Cammie Sickle, MD 11/25/2020 3:37 PM

## 2021-03-19 ENCOUNTER — Encounter (HOSPITAL_BASED_OUTPATIENT_CLINIC_OR_DEPARTMENT_OTHER): Payer: Self-pay

## 2021-03-19 ENCOUNTER — Other Ambulatory Visit: Payer: Self-pay

## 2021-03-19 ENCOUNTER — Emergency Department (HOSPITAL_BASED_OUTPATIENT_CLINIC_OR_DEPARTMENT_OTHER)
Admission: EM | Admit: 2021-03-19 | Discharge: 2021-03-19 | Disposition: A | Discharge status: Home / Self Care | Payer: Federal, State, Local not specified - PPO | Attending: Emergency Medicine | Admitting: Emergency Medicine

## 2021-03-19 DIAGNOSIS — R42 Dizziness and giddiness: Secondary | ICD-10-CM | POA: Insufficient documentation

## 2021-03-19 DIAGNOSIS — R55 Syncope and collapse: Secondary | ICD-10-CM | POA: Insufficient documentation

## 2021-03-19 LAB — HEPATIC FUNCTION PANEL
ALT: 38 U/L (ref 0–44)
AST: 28 U/L (ref 15–41)
Albumin: 4.2 g/dL (ref 3.5–5.0)
Alkaline Phosphatase: 83 U/L (ref 38–126)
Bilirubin, Direct: 0.1 mg/dL (ref 0.0–0.2)
Indirect Bilirubin: 0.5 mg/dL (ref 0.3–0.9)
Total Bilirubin: 0.6 mg/dL (ref 0.3–1.2)
Total Protein: 7.3 g/dL (ref 6.5–8.1)

## 2021-03-19 LAB — URINALYSIS, ROUTINE W REFLEX MICROSCOPIC
Bilirubin Urine: NEGATIVE
Glucose, UA: NEGATIVE mg/dL
Hgb urine dipstick: NEGATIVE
Ketones, ur: NEGATIVE mg/dL
Leukocytes,Ua: NEGATIVE
Nitrite: NEGATIVE
Protein, ur: NEGATIVE mg/dL
Specific Gravity, Urine: 1.025 (ref 1.005–1.030)
pH: 7 (ref 5.0–8.0)

## 2021-03-19 LAB — CBC
HCT: 43 % (ref 36.0–46.0)
Hemoglobin: 14.2 g/dL (ref 12.0–15.0)
MCH: 31.1 pg (ref 26.0–34.0)
MCHC: 33 g/dL (ref 30.0–36.0)
MCV: 94.1 fL (ref 80.0–100.0)
Platelets: 324 10*3/uL (ref 150–400)
RBC: 4.57 MIL/uL (ref 3.87–5.11)
RDW: 12 % (ref 11.5–15.5)
WBC: 6.7 10*3/uL (ref 4.0–10.5)
nRBC: 0 % (ref 0.0–0.2)

## 2021-03-19 LAB — CBG MONITORING, ED: Glucose-Capillary: 85 mg/dL (ref 70–99)

## 2021-03-19 LAB — BASIC METABOLIC PANEL
Anion gap: 8 (ref 5–15)
BUN: 19 mg/dL (ref 6–20)
CO2: 24 mmol/L (ref 22–32)
Calcium: 9.3 mg/dL (ref 8.9–10.3)
Chloride: 104 mmol/L (ref 98–111)
Creatinine, Ser: 0.75 mg/dL (ref 0.44–1.00)
GFR, Estimated: >60 mL/min (ref >60)
Glucose, Bld: 94 mg/dL (ref 70–99)
Potassium: 3.7 mmol/L (ref 3.5–5.1)
Sodium: 136 mmol/L (ref 135–145)

## 2021-03-19 LAB — PREGNANCY, URINE: Preg Test, Ur: NEGATIVE

## 2021-03-19 LAB — TROPONIN I (HIGH SENSITIVITY): Troponin I (High Sensitivity): <2 ng/L (ref <18)

## 2021-03-19 LAB — TSH: TSH: 1.219 u[IU]/mL (ref 0.350–4.500)

## 2021-03-19 MED ORDER — SODIUM CHLORIDE 0.9 % IV BOLUS
1000.0000 mL | Freq: Once | INTRAVENOUS | Status: AC
  Administered 2021-03-19: 1000 mL via INTRAVENOUS

## 2021-03-19 NOTE — ED Triage Notes (Addendum)
Pt reports syncopal episode in her BR ~430am-states she went to work-was advised by PCP to come to ED-NAD-steady gait-pt later added she has been on abx x 3 days for UTI  ?

## 2021-03-19 NOTE — ED Provider Notes (Signed)
?[facility] EMERGENCY DEPARTMENT ?Provider Note ? ? ?CSN: [id number] ?Arrival date & time: 03/19/21  1142 ? ?  ? ?History ? ?Chief Complaint  ?Patient presents with  ? Loss of Consciousness  ? ? ?Katrina Holland is a 61 y.o. female. ? ? ?Loss of Consciousness ?Associated symptoms: dizziness   ?Associated symptoms: no confusion   ?Patient presents after syncopal episode.  States she was in the bathroom in the shower around 430.  States began to feel lightheaded.  Felt as if she is going to pass out.  Tried changing to cold water but did not help.  Went outside the shower and passed out.  States she sat down did not injure self.  No chest pain.  No trouble breathing.  States since then she is felt a little unsteady.  States feels little off.  States she had passed out previously when she had breast cancer and stated she felt the same at the time. ?  ? ?Home Medications ?Prior to Admission medications   ?Medication Sig Start Date End Date Taking? Authorizing Provider  ?buPROPion (WELLBUTRIN XL) 150 MG 24 hr tablet Take 150 mg by mouth daily. ?Patient not taking: Reported on 11/25/2020 06/30/20   [provider]  ?diclofenac (VOLTAREN) 75 MG EC tablet Take 75 mg by mouth 2 (two) times daily.  03/10/15   [provider]  ?letrozole (FEMARA) 2.5 MG tablet Take 1 tablet (2.5 mg total) by mouth daily. ?Patient not taking: Reported on 11/25/2020 08/17/20   Cammie Sickle, MD  ?montelukast (SINGULAIR) 10 MG tablet TAKE ONE TABLET BY MOUTH AT BEDTIME 07/20/15   Tanda Rockers, MD  ?pravastatin (PRAVACHOL) 10 MG tablet Take 10 mg by mouth at bedtime. 10/30/17   [provider]  ?topiramate (TOPAMAX) 25 MG tablet Take 25 mg by mouth 2 (two) times daily. ?Patient not taking: Reported on 11/25/2020 06/30/20   [provider]  ?venlafaxine (EFFEXOR) 75 MG tablet TAKE ONE TABLET BY MOUTH TWICE DAILY WITH MEALS ?Patient taking differently: Take 75 mg by mouth every morning. 09/05/16    Cammie Sickle, MD  ?   ? ?Allergies    ?Augmentin [amoxicillin-pot clavulanate]   ? ?Review of Systems   ?Review of Systems  ?Constitutional:  Negative for appetite change.  ?Cardiovascular:  Positive for syncope.  ?Gastrointestinal:  Negative for abdominal pain.  ?Musculoskeletal:  Negative for gait problem.  ?Neurological:  Positive for dizziness and syncope.  ?Psychiatric/Behavioral:  Negative for confusion.   ? ?Physical Exam ?Updated Vital Signs ?BP 106/76   Pulse 71   Temp 98.1 ?F (36.7 ?C) (Oral)   Resp 20   Ht '5\' 2"'$  (1.575 m)   Wt 83.9 kg   SpO2 98%   BMI 33.84 kg/m?  ?Physical Exam ?Vitals and nursing note reviewed.  ?HENT:  ?   Head: Atraumatic.  ?Cardiovascular:  ?   Rate and Rhythm: Regular rhythm.  ?Pulmonary:  ?   Breath sounds: No wheezing or rhonchi.  ?Abdominal:  ?   Tenderness: There is no abdominal tenderness.  ?Musculoskeletal:     ?   General: No tenderness.  ?   Cervical back: No tenderness.  ?Skin: ?   General: Skin is warm.  ?   Capillary Refill: Capillary refill takes less than 2 seconds.  ?Neurological:  ?   Mental Status: She is alert and oriented to person, place, and time.  ? ? ?ED Results / Procedures / Treatments   ?Labs ?(all labs ordered  are listed, but only abnormal results are displayed) ?Labs Reviewed  ?URINALYSIS, ROUTINE W REFLEX MICROSCOPIC - Abnormal; Notable for the following components:  ?    Result Value  ? Color, Urine AMBER (*)   ? All other components within normal limits  ?BASIC METABOLIC PANEL  ?CBC  ?PREGNANCY, URINE  ?HEPATIC FUNCTION PANEL  ?TSH  ?CBG MONITORING, ED  ?TROPONIN I (HIGH SENSITIVITY)  ? ? ?EKG ?EKG Interpretation ? ?Date/Time:  Friday March 19 2021 11:55:36 EST ?Ventricular Rate:  75 ?PR Interval:  142 ?QRS Duration: 76 ?QT Interval:  398 ?QTC Calculation: 444 ?R Axis:   42 ?Text Interpretation: Normal sinus rhythm Normal ECG When compared with ECG of 08-Jul-2018 17:06, PREVIOUS ECG IS PRESENT No significant change since last tracing  Confirmed by Davonna Belling [id number]) on 03/19/2021 12:22:11 PM ? ?Radiology ?No results found. ? ?Procedures ?Procedures  ? ? ?Medications Ordered in ED ?Medications  ?sodium chloride 0.9 % bolus 1,000 mL (0 mLs Intravenous Stopped 03/19/21 1418)  ? ? ?ED Course/ Medical Decision Making/ A&P ?  ?                        ?Medical Decision Making ?Amount and/or Complexity of Data Reviewed ?Labs: ordered. ? ? ?Patient with syncopal episode.  Initial differential gnosis for syncope is broad and includes life-threatening conditions.  EKG reviewed and reassuring.  Lab work also reassuring.  TSH pending at time of discharge.  Was not orthostatic.  Fluid bolus given and feels better.  With the heat of the shower likely component vasodilatation causing it.  Doubt cardiac ischemia.  Doubt severe arrhythmia.  Appears stable to follow-up as an outpatient.  Discharge home. ? ? ? ? ? ? ? ?Final Clinical Impression(s) / ED Diagnoses ?Final diagnoses:  ?Syncope, unspecified syncope type  ? ? ?Rx / DC Orders ?ED Discharge Orders   ? ? None  ? ?  ? ? ?  ?Davonna Belling, MD ?03/20/21 [phone] ? ?

## 2021-03-19 NOTE — ED Notes (Signed)
Orthostatics done on pt, pt reported no dizziness when changing positions.  ?

## 2021-04-26 HISTORY — PX: UPPER GI ENDOSCOPY: SHX6162

## 2021-05-26 ENCOUNTER — Encounter: Payer: Self-pay | Admitting: Internal Medicine

## 2021-05-26 ENCOUNTER — Inpatient Hospital Stay: Payer: Federal, State, Local not specified - PPO

## 2021-05-26 ENCOUNTER — Inpatient Hospital Stay (HOSPITAL_BASED_OUTPATIENT_CLINIC_OR_DEPARTMENT_OTHER): Payer: Federal, State, Local not specified - PPO | Admitting: Internal Medicine

## 2021-05-26 ENCOUNTER — Inpatient Hospital Stay: Payer: Federal, State, Local not specified - PPO | Attending: Internal Medicine

## 2021-05-26 DIAGNOSIS — Z87891 Personal history of nicotine dependence: Secondary | ICD-10-CM | POA: Insufficient documentation

## 2021-05-26 DIAGNOSIS — R232 Flushing: Secondary | ICD-10-CM | POA: Insufficient documentation

## 2021-05-26 DIAGNOSIS — C50812 Malignant neoplasm of overlapping sites of left female breast: Secondary | ICD-10-CM | POA: Diagnosis present

## 2021-05-26 DIAGNOSIS — N951 Menopausal and female climacteric states: Secondary | ICD-10-CM | POA: Insufficient documentation

## 2021-05-26 DIAGNOSIS — M8580 Other specified disorders of bone density and structure, unspecified site: Secondary | ICD-10-CM | POA: Insufficient documentation

## 2021-05-26 DIAGNOSIS — Z170 Estrogen receptor positive status [ER+]: Secondary | ICD-10-CM

## 2021-05-26 LAB — CBC WITH DIFFERENTIAL/PLATELET
Abs Immature Granulocytes: 0.04 10*3/uL (ref 0.00–0.07)
Basophils Absolute: 0 10*3/uL (ref 0.0–0.1)
Basophils Relative: 1 %
Eosinophils Absolute: 0.1 10*3/uL (ref 0.0–0.5)
Eosinophils Relative: 1 %
HCT: 43.1 % (ref 36.0–46.0)
Hemoglobin: 14.1 g/dL (ref 12.0–15.0)
Immature Granulocytes: 1 %
Lymphocytes Relative: 28 %
Lymphs Abs: 2.4 10*3/uL (ref 0.7–4.0)
MCH: 31.1 pg (ref 26.0–34.0)
MCHC: 32.7 g/dL (ref 30.0–36.0)
MCV: 95.1 fL (ref 80.0–100.0)
Monocytes Absolute: 0.5 10*3/uL (ref 0.1–1.0)
Monocytes Relative: 6 %
Neutro Abs: 5.6 10*3/uL (ref 1.7–7.7)
Neutrophils Relative %: 63 %
Platelets: 313 10*3/uL (ref 150–400)
RBC: 4.53 MIL/uL (ref 3.87–5.11)
RDW: 12 % (ref 11.5–15.5)
WBC: 8.6 10*3/uL (ref 4.0–10.5)
nRBC: 0 % (ref 0.0–0.2)

## 2021-05-26 LAB — COMPREHENSIVE METABOLIC PANEL
ALT: 30 U/L (ref 0–44)
AST: 22 U/L (ref 15–41)
Albumin: 4.3 g/dL (ref 3.5–5.0)
Alkaline Phosphatase: 81 U/L (ref 38–126)
Anion gap: 7 (ref 5–15)
BUN: 21 mg/dL — ABNORMAL HIGH (ref 6–20)
CO2: 27 mmol/L (ref 22–32)
Calcium: 9.3 mg/dL (ref 8.9–10.3)
Chloride: 104 mmol/L (ref 98–111)
Creatinine, Ser: 0.92 mg/dL (ref 0.44–1.00)
GFR, Estimated: >60 mL/min (ref >60)
Glucose, Bld: 97 mg/dL (ref 70–99)
Potassium: 3.7 mmol/L (ref 3.5–5.1)
Sodium: 138 mmol/L (ref 135–145)
Total Bilirubin: 0.7 mg/dL (ref 0.3–1.2)
Total Protein: 7.3 g/dL (ref 6.5–8.1)

## 2021-05-26 MED ORDER — SODIUM CHLORIDE 0.9 % IV SOLN
Freq: Once | INTRAVENOUS | Status: AC
  Filled 2021-05-26: qty 250

## 2021-05-26 MED ORDER — ZOLEDRONIC ACID 4 MG/100ML IV SOLN
4.0000 mg | Freq: Once | INTRAVENOUS | Status: AC
  Administered 2021-05-26: 4 mg via INTRAVENOUS
  Filled 2021-05-26: qty 100

## 2021-05-26 NOTE — Patient Instructions (Signed)

## 2021-05-26 NOTE — Assessment & Plan Note (Addendum)
#  Stage I ER/PR positive HER-2/neu positive breast cancer; currently on surveillaince only.  SEP 2022- bil screening- WNL.  ? ?# Bil rib pain/ low thoracic Back pain-s/p [pain clinic]; MRI spine- NEGATIVE- ? S/p epidural [Dr.Chasnis].  Defer to PCP for further management. ? ?# Hot flashes- grade 1 STABLE.  ? ?# Osteopenia-OCT 2021- Neck Left is 0.872 g/cm2 with a T-score of -1.2. On zometa x every 6 months.  Calcium today within normal limits.;plan BMD in sep 2023. ? ?# DISPOSITION: ?# Zometa today ?# SEP 2023- bil screen Mammo; BMD ?# Follow up in 6 months; MD; labs- cbc/cmp;Zometa-Dr.B ? ?Cc: A ?

## 2021-05-26 NOTE — Progress Notes (Signed)
Having upper back pain that she gets epidural injections for. ?

## 2021-05-26 NOTE — Progress Notes (Signed)
Patient tolerated Zometa infusion well, no questions/concerns voiced. Patient stable at discharge. AVS given.   ?

## 2021-05-26 NOTE — Progress Notes (Signed)
[place] ?OFFICE PROGRESS NOTE ? ?Patient Care Team: ?Kirk Ruths, MD as PCP - General (Internal Medicine) ?Robert Bellow, MD (General Surgery) ?Dear, [name], MD (Inactive) (Family Medicine) ?Cammie Sickle, MD as Consulting Physician (Hematology and Oncology) ? ? Cancer Staging  ?No matching staging information was found for the patient. ? ? ?Oncology History Overview Note  ?# JAN 2014- LEFT BREAST CA IDC; pT1c (1.2cm) pN0 [Stage I] ER/PR > 90%; her 2 Neu POS; AC x4- Taxol-Herceptin; Intol to AI; March 2015- Started TAM [stopped- Sep 2017]; OCT 2017- Post menopausal [estradiol/FSH]; NOV 1st 2017- Start ARIMIDEX; STOPPED [facility]- summer 2022 [? MSK] ? ?# DEC 2016- Start Effexor  ? ?# Right oopherectomy; Left intact ovary-intact [last periods late 90s] ? ?DIAGNOSIS:Breast cancer ? ?STAGE:  I       ;GOALS: cure ? ?CURRENT/MOST RECENT THERAPY: anastrazole ? ?  ?Malignant neoplasm of overlapping sites of left breast in female, estrogen receptor positive ([place])  ? ?  ? ?INTERVAL HISTORY: ? ?Katrina Holland 61 y.o.  female pleasant patient above history of stage I breast cancer ER PR positive currently on surveillance is here for follow-up. ? ?Patient did not notice any significant improvement of the aches and pains.  Patient s/p steroid injection.  Back pain improved not resolved.  Continues to complain of fatigue. ? ?Currently resolved-off medications.  Gaining weight.  ? ?Review of Systems  ?Constitutional:  Positive for malaise/fatigue. Negative for chills, diaphoresis and weight loss.  ?HENT:  Negative for nosebleeds and sore throat.   ?Eyes:  Negative for double vision.  ?Respiratory:  Negative for hemoptysis and wheezing.   ?Cardiovascular:  Negative for palpitations, orthopnea and leg swelling.  ?Gastrointestinal:  Negative for abdominal pain, blood in stool, constipation, diarrhea, heartburn, melena, nausea and vomiting.  ?Genitourinary:  Negative for dysuria, frequency and  urgency.  ?Musculoskeletal:  Positive for joint pain. Negative for back pain.  ?Skin: Negative.  Negative for itching and rash.  ?Neurological:  Negative for dizziness, tingling, focal weakness, weakness and headaches.  ?Endo/Heme/Allergies:  Does not bruise/bleed easily.  ?Psychiatric/Behavioral:  Negative for depression. The patient is not nervous/anxious and does not have insomnia.   ?  ? ?PAST MEDICAL HISTORY :  ?Past Medical History:  ?Diagnosis Date  ? Anxiety   ? Arthritis   ? Asthma   ? Breast CA ([place])   ? Breast cancer ([place]) 2014  ? left breast  ? Dyspnea   ? with dry cough x 7 years  ? Endometriosis 2001  ? GERD (gastroesophageal reflux disease)   ? Headache   ? h/o migraines  ? Heart murmur   ? asymptomatic  ? Irritable bowel syndrome   ? Malignant neoplasm of upper-outer quadrant of female breast ([place]) 01/30/2012  ? Left breast, Invasive mammary cancer, no special type: T1c,N0,M0; ER 90%, PR 90%, no amplification of HER-2/neu, aromatase inhibitors discontinued September 2015 secondary to joint symptoms, tamoxifen initiated by Dr. Ma Hillock..  ? Personal history of chemotherapy   ? Personal history of radiation therapy   ? Pneumonia   ? Pulmonary hypertension ([place]) 2013  ? Sleep apnea 2007  ? does not use cpap   ? Sleep apnea 2007  ? ? ?PAST SURGICAL HISTORY :   ?Past Surgical History:  ?Procedure Laterality Date  ? BREAST BIOPSY Left 2013  ? neg,   ? BREAST BIOPSY Left 01/2012  ? invasive ductal carcinoma  ? BREAST BIOPSY Left 12/2012  ? benign, done at Dr.  Byrnett's office, [place]  ? BREAST ENHANCEMENT SURGERY  2014,05/2013  ? BREAST LUMPECTOMY Left 2014  ? invasive ductal carcinoma  ? BREAST SURGERY Left 01/30/2012  ? Wide excision, sentinel node biopsy  ? BREAST SURGERY Left 02/09/2012  ? Left breast reduction/reconstruction: Nicholaus Bloom, MD.  ? BREAST SURGERY Left 12/26/2012  ? ultrasound-guided biopsy, post surgical change.  ? COLONOSCOPY  2012  ? DILATION AND CURETTAGE OF UTERUS  2011  ? EXPLORATORY  LAPAROTOMY  2001  ? FACIAL COSMETIC SURGERY  2015  ? FLEXIBLE BRONCHOSCOPY N/A 09/12/2018  ? Procedure: BRONCHOSCOPY, BRONCHOALVEOLAR LAVAGE, SLEEP APNEA;  Surgeon: Ottie Glazier, MD;  Location: [facility] ORS;  Service: Thoracic;  Laterality: N/A;  ? [place]  ? NISSEN FUNDOPLICATION  9147  ? OVARY SURGERY Right 2001  ? PORT A CATH REVISION  2014  ? REDUCTION MAMMAPLASTY Left 2014  ? REDUCTION MAMMAPLASTY Right 2015  ? RIGHT HEART CATH  2013  ? TONSILLECTOMY  1969  ? UPPER GI ENDOSCOPY  04/26/2021  ? ? ?FAMILY HISTORY :   ?Family History  ?Problem Relation Age of Onset  ? Colon cancer Paternal Grandfather   ? Breast cancer Maternal Aunt 9  ? Breast cancer Cousin 35  ? Breast cancer Cousin 35  ? Breast cancer Cousin 28  ? ? ?SOCIAL HISTORY:   ?Social History  ? ?Tobacco Use  ? Smoking status: Former  ?  Packs/day: 0.10  ?  Years: 25.00  ?  Pack years: 2.50  ?  Types: Cigarettes  ?  Quit date: 01/11/2007  ?  Years since quitting: 14.3  ? Smokeless tobacco: Never  ? Tobacco comments:  ?  "social smoker" per pt.  ?Vaping Use  ? Vaping Use: Never used  ?Substance Use Topics  ? Alcohol use: No  ? Drug use: No  ? ? ?ALLERGIES:  is allergic to augmentin [amoxicillin-pot clavulanate]. ? ?MEDICATIONS:  ?Current Outpatient Medications  ?Medication Sig Dispense Refill  ? diclofenac (VOLTAREN) 75 MG EC tablet Take 75 mg by mouth 2 (two) times daily.     ? montelukast (SINGULAIR) 10 MG tablet TAKE ONE TABLET BY MOUTH AT BEDTIME 30 tablet 0  ? pravastatin (PRAVACHOL) 10 MG tablet Take 10 mg by mouth at bedtime.    ? ?No current facility-administered medications for this visit.  ? ? ?PHYSICAL EXAMINATION: ?ECOG PERFORMANCE STATUS: 0 - Asymptomatic ? ?BP (!) 168/64 (BP Location: Right Arm, Patient Position: Sitting, Cuff Size: Normal)   Pulse 84   Temp (!) 96.2 ?F (35.7 ?C) (Tympanic)   Ht _0  (1.575 m)   Wt 199 lb 3.2 oz (90.4 kg)   SpO2 99%   BMI 36.43 kg/m?  ? ?Filed Weights  ? 05/26/21 1326  ?Weight: 199 lb 3.2 oz  (90.4 kg)  ? ? ?Physical Exam ?HENT:  ?   Head: Normocephalic and atraumatic.  ?   Mouth/Throat:  ?   Pharynx: No oropharyngeal exudate.  ?Eyes:  ?   Pupils: Pupils are equal, round, and reactive to light.  ?Cardiovascular:  ?   Rate and Rhythm: Normal rate and regular rhythm.  ?Pulmonary:  ?   Effort: No respiratory distress.  ?   Breath sounds: No wheezing.  ?Abdominal:  ?   General: Bowel sounds are normal. There is no distension.  ?   Palpations: Abdomen is soft. There is no mass.  ?   Tenderness: There is no abdominal tenderness. There is no guarding or rebound.  ?Musculoskeletal:     ?  General: No tenderness. Normal range of motion.  ?   Cervical back: Normal range of motion and neck supple.  ?Skin: ?   General: Skin is warm.  ?   Comments:   ?  ?Neurological:  ?   Mental Status: She is alert and oriented to person, place, and time.  ?Psychiatric:     ?   Mood and Affect: Affect normal.  ? ? ?LABORATORY DATA:  ?I have reviewed the data as listed ?   ?Component Value Date/Time  ? NA 138 05/26/2021 1320  ? NA 140 09/25/2013 1425  ? K 3.7 05/26/2021 1320  ? K 4.0 09/25/2013 1425  ? CL 104 05/26/2021 1320  ? CL 103 09/25/2013 1425  ? CO2 27 05/26/2021 1320  ? CO2 29 09/25/2013 1425  ? GLUCOSE 97 05/26/2021 1320  ? GLUCOSE 92 09/25/2013 1425  ? BUN 21 (H) 05/26/2021 1320  ? BUN 17 09/25/2013 1425  ? CREATININE 0.92 05/26/2021 1320  ? CREATININE 0.87 09/25/2013 1425  ? CALCIUM 9.3 05/26/2021 1320  ? CALCIUM 10.1 09/25/2013 1425  ? PROT 7.3 05/26/2021 1320  ? PROT 7.2 09/25/2013 1425  ? ALBUMIN 4.3 05/26/2021 1320  ? ALBUMIN 3.9 09/25/2013 1425  ? AST 22 05/26/2021 1320  ? AST 30 09/25/2013 1425  ? ALT 30 05/26/2021 1320  ? ALT 55 09/25/2013 1425  ? ALKPHOS 81 05/26/2021 1320  ? ALKPHOS 154 (H) 09/25/2013 1425  ? BILITOT 0.7 05/26/2021 1320  ? BILITOT 0.4 09/25/2013 1425  ? GFRNONAA >60 05/26/2021 1320  ? GFRNONAA >60 09/25/2013 1425  ? GFRAA >60 08/21/2019 1315  ? GFRAA >60 09/25/2013 1425  ? ? ?No results found  for: SPEP, UPEP ? ?Lab Results  ?Component Value Date  ? WBC 8.6 05/26/2021  ? NEUTROABS 5.6 05/26/2021  ? HGB 14.1 05/26/2021  ? HCT 43.1 05/26/2021  ? MCV 95.1 05/26/2021  ? PLT 313 05/26/2021  ? ? ?  Chemist

## 2021-09-29 ENCOUNTER — Ambulatory Visit
Admission: RE | Admit: 2021-09-29 | Discharge: 2021-09-29 | Disposition: A | Discharge status: Home / Self Care | Payer: Federal, State, Local not specified - PPO | Source: Ambulatory Visit | Attending: Internal Medicine | Admitting: Internal Medicine

## 2021-09-29 DIAGNOSIS — C50812 Malignant neoplasm of overlapping sites of left female breast: Secondary | ICD-10-CM | POA: Diagnosis present

## 2021-09-29 DIAGNOSIS — Z170 Estrogen receptor positive status [ER+]: Secondary | ICD-10-CM | POA: Insufficient documentation

## 2021-11-24 ENCOUNTER — Inpatient Hospital Stay: Payer: Federal, State, Local not specified - PPO | Attending: Oncology

## 2021-11-24 ENCOUNTER — Inpatient Hospital Stay (HOSPITAL_BASED_OUTPATIENT_CLINIC_OR_DEPARTMENT_OTHER): Payer: Federal, State, Local not specified - PPO | Admitting: Oncology

## 2021-11-24 ENCOUNTER — Inpatient Hospital Stay: Payer: Federal, State, Local not specified - PPO

## 2021-11-24 ENCOUNTER — Encounter: Payer: Self-pay | Admitting: Oncology

## 2021-11-24 VITALS — BP 110/84 | HR 75 | Temp 96.8°F | Resp 18 | Wt 193.0 lb

## 2021-11-24 DIAGNOSIS — Z170 Estrogen receptor positive status [ER+]: Secondary | ICD-10-CM

## 2021-11-24 DIAGNOSIS — Z87891 Personal history of nicotine dependence: Secondary | ICD-10-CM | POA: Diagnosis not present

## 2021-11-24 DIAGNOSIS — Z923 Personal history of irradiation: Secondary | ICD-10-CM | POA: Diagnosis not present

## 2021-11-24 DIAGNOSIS — Z853 Personal history of malignant neoplasm of breast: Secondary | ICD-10-CM | POA: Diagnosis present

## 2021-11-24 DIAGNOSIS — M8580 Other specified disorders of bone density and structure, unspecified site: Secondary | ICD-10-CM | POA: Diagnosis not present

## 2021-11-24 DIAGNOSIS — C50812 Malignant neoplasm of overlapping sites of left female breast: Secondary | ICD-10-CM

## 2021-11-24 DIAGNOSIS — Z9221 Personal history of antineoplastic chemotherapy: Secondary | ICD-10-CM | POA: Insufficient documentation

## 2021-11-24 LAB — COMPREHENSIVE METABOLIC PANEL
ALT: 35 U/L (ref 0–44)
AST: 26 U/L (ref 15–41)
Albumin: 4 g/dL (ref 3.5–5.0)
Alkaline Phosphatase: 72 U/L (ref 38–126)
Anion gap: 8 (ref 5–15)
BUN: 15 mg/dL (ref 6–20)
CO2: 23 mmol/L (ref 22–32)
Calcium: 9 mg/dL (ref 8.9–10.3)
Chloride: 107 mmol/L (ref 98–111)
Creatinine, Ser: 0.7 mg/dL (ref 0.44–1.00)
GFR, Estimated: >60 mL/min (ref >60)
Glucose, Bld: 112 mg/dL — ABNORMAL HIGH (ref 70–99)
Potassium: 3.8 mmol/L (ref 3.5–5.1)
Sodium: 138 mmol/L (ref 135–145)
Total Bilirubin: 0.6 mg/dL (ref 0.3–1.2)
Total Protein: 6.9 g/dL (ref 6.5–8.1)

## 2021-11-24 LAB — CBC WITH DIFFERENTIAL/PLATELET
Abs Immature Granulocytes: 0.03 10*3/uL (ref 0.00–0.07)
Basophils Absolute: 0 10*3/uL (ref 0.0–0.1)
Basophils Relative: 1 %
Eosinophils Absolute: 0.1 10*3/uL (ref 0.0–0.5)
Eosinophils Relative: 1 %
HCT: 40.4 % (ref 36.0–46.0)
Hemoglobin: 13.8 g/dL (ref 12.0–15.0)
Immature Granulocytes: 1 %
Lymphocytes Relative: 38 %
Lymphs Abs: 2.3 10*3/uL (ref 0.7–4.0)
MCH: 31.4 pg (ref 26.0–34.0)
MCHC: 34.2 g/dL (ref 30.0–36.0)
MCV: 92 fL (ref 80.0–100.0)
Monocytes Absolute: 0.4 10*3/uL (ref 0.1–1.0)
Monocytes Relative: 6 %
Neutro Abs: 3.3 10*3/uL (ref 1.7–7.7)
Neutrophils Relative %: 53 %
Platelets: 323 10*3/uL (ref 150–400)
RBC: 4.39 MIL/uL (ref 3.87–5.11)
RDW: 11.9 % (ref 11.5–15.5)
WBC: 6.1 10*3/uL (ref 4.0–10.5)
nRBC: 0 % (ref 0.0–0.2)

## 2021-11-24 MED ORDER — SODIUM CHLORIDE 0.9 % IV SOLN
Freq: Once | INTRAVENOUS | Status: AC
  Filled 2021-11-24: qty 250

## 2021-11-24 MED ORDER — ZOLEDRONIC ACID 4 MG/100ML IV SOLN
4.0000 mg | Freq: Once | INTRAVENOUS | Status: AC
  Administered 2021-11-24: 4 mg via INTRAVENOUS
  Filled 2021-11-24: qty 100

## 2021-11-24 NOTE — Patient Instructions (Signed)

## 2021-11-24 NOTE — Progress Notes (Signed)
Pt doing well. Does complain of achiness and pain and low grade fever after zometa infusions. Still has some hot flashes but improved after stopping AI's.

## 2021-11-24 NOTE — Progress Notes (Signed)
Katrina Holland  [phone] [phone]) [phone]  ID: RAHIMA FLEISHMAN OB: [birth year]  MR#: [id number]  LAG#:[id number]  Patient Care Team: Kirk Ruths, MD as PCP - General (Internal Medicine) Bary Castilla, Forest Gleason, MD (General Surgery) Dear, [name], MD (Inactive) (Family Medicine) Cammie Sickle, MD as Consulting Physician (Hematology and Oncology)  CHIEF COMPLAINT: Osteopenia, history of breast cancer.  INTERVAL HISTORY: Patient returns to clinic today for routine 34-monthevaluation and continuation of Zometa.  She currently feels well and is asymptomatic.  She has no neurologic complaints.  She denies any recent fevers or illnesses.  She has a good appetite and denies weight loss.  She has no chest pain, shortness of breath, cough, or hemoptysis.  She denies any nausea, vomiting, constipation, or diarrhea.  She has no urinary complaints.  Patient feels at her baseline and offers no specific complaints today.  REVIEW OF SYSTEMS:   Review of Systems  Constitutional: Negative.  Negative for fever, malaise/fatigue and weight loss.  Respiratory: Negative.  Negative for cough and shortness of breath.   Cardiovascular: Negative.  Negative for chest pain and leg swelling.  Gastrointestinal: Negative.  Negative for abdominal pain and heartburn.  Genitourinary: Negative.  Negative for dysuria.  Musculoskeletal: Negative.  Negative for back pain.  Skin: Negative.  Negative for rash.  Neurological: Negative.  Negative for dizziness, focal weakness, weakness and headaches.  Psychiatric/Behavioral: Negative.  The patient is not nervous/anxious.     As per HPI. Otherwise, a complete review of systems is negative.  PAST MEDICAL HISTORY: Past Medical History:  Diagnosis Date   Anxiety    Arthritis    Asthma    [place]    [place]   [place] breast   Dyspnea    with dry cough x 7 years   Endometriosis 2001   GERD (gastroesophageal  reflux disease)    Headache    h/o migraines   Heart murmur    asymptomatic   Irritable bowel syndrome    [place]   Left breast, Invasive mammary cancer, no special type: T1c,N0,M0; ER 90%, PR 90%, no amplification of HER-2/neu, aromatase inhibitors discontinued September 2015 secondary to joint symptoms, tamoxifen initiated by Dr. PMa Hillock.   Personal history of chemotherapy    Personal history of radiation therapy    Pneumonia    [place]ulmonary [place] 2013   Sleep apnea 2007   does not use cpap    Sleep apnea 2007    PAST SURGICAL HISTORY: Past Surgical History:  Procedure Laterality Date   BREAST BIOPSY Left 2013   neg,    BREAST BIOPSY Left 01/2012   invasive ductal carcinoma   BREAST BIOPSY Left 12/2012   benign, done at [place]. [place]wy[place] Luooffice, [place]   BREAST ENHANCEMENT SURGERY  2014,05/2013   BREAST LUMPECTOMY Left 2014   invasive ductal carcinoma   BREAST SURGERY Left 01/30/2012   Wide excision, sentinel node biopsy   BREAST SURGERY Left 02/09/2012   Left breast reduction/reconstruction: BNicholaus Bloom MD.   BREAST SURGERY Left 12/26/2012   ultrasound-guided biopsy, post surgical change.   COLONOSCOPY  2012   DILATION AND CURETTAGE OF UTERUS  2011   EXPLORATORY LAPAROTOMY  2001   FACIAL COSMETIC SURGERY  2015   FLEXIBLE BRONCHOSCOPY N/A 09/12/2018   Procedure: BRONCHOSCOPY, BRONCHOALVEOLAR LAVAGE, SLEEP APNEA;  Surgeon: AOttie Glazier MD;  Location: [facility] ORS;  Service: Thoracic;  Laterality: N/A;  HERNIA REPAIR  3009   NISSEN FUNDOPLICATION  2330   OVARY SURGERY Right 2001   PORT A CATH REVISION  2014   REDUCTION MAMMAPLASTY Left 2014   REDUCTION MAMMAPLASTY Right 2015   RIGHT HEART CATH  2013   TONSILLECTOMY  1969   UPPER GI ENDOSCOPY  04/26/2021    FAMILY HISTORY: Family History  Problem Relation Age of Onset   Colon cancer Paternal Grandfather    Breast cancer Maternal Aunt 36    Breast cancer Cousin 35   Breast cancer Cousin 35   Breast cancer Cousin 67    ADVANCED DIRECTIVES (Y/N):  N  HEALTH MAINTENANCE: Social History   Tobacco Use   Smoking status: Former    Packs/day: 0.10    Years: 25.00    Total pack years: 2.50    Types: Cigarettes    Quit date: 01/11/2007    Years since quitting: 14.8   Smokeless tobacco: Never   Tobacco comments:    "social smoker" per pt.  Vaping Use   Vaping Use: Never used  Substance Use Topics   Alcohol use: No   Drug use: No     Colonoscopy:  PAP:  Bone density:  Lipid panel:  Allergies  Allergen Reactions   Augmentin [Amoxicillin-Pot Clavulanate]     "makes me sick"    Current Outpatient Medications  Medication Sig Dispense Refill   CONTRAVE 8-90 MG TB12 Take 2 tablets by mouth 2 (two) times daily.     diclofenac (VOLTAREN) 75 MG EC tablet Take 75 mg by mouth 2 (two) times daily.      montelukast (SINGULAIR) 10 MG tablet TAKE ONE TABLET BY MOUTH AT BEDTIME 30 tablet 0   pantoprazole (PROTONIX) 40 MG tablet Take 40 mg by mouth every morning.     pravastatin (PRAVACHOL) 10 MG tablet Take 10 mg by mouth at bedtime.     No current facility-administered medications for this visit.    OBJECTIVE: Vitals:   11/24/21 1014  BP: 110/84  Pulse: 75  Resp: 18  Temp: (!) 96.8 F (36 C)     Body mass index is 35.3 kg/m.    ECOG FS:0 - Asymptomatic  General: Well-developed, well-nourished, no acute distress. Eyes: Pink conjunctiva, anicteric sclera. HEENT: Normocephalic, moist mucous membranes. Breast: Exam deferred today. Lungs: No audible wheezing or coughing. Heart: Regular rate and rhythm. Abdomen: Soft, nontender, no obvious distention. Musculoskeletal: No edema, cyanosis, or clubbing. Neuro: Alert, answering all questions appropriately. Cranial nerves grossly intact. Skin: No rashes or petechiae noted. Psych: Normal affect. Lymphatics: No cervical, calvicular, axillary or inguinal LAD.   LAB  RESULTS:  Lab Results  Component Value Date   NA 138 11/24/2021   K 3.8 11/24/2021   CL 107 11/24/2021   CO2 23 11/24/2021   GLUCOSE 112 (H) 11/24/2021   BUN 15 11/24/2021   CREATININE 0.70 11/24/2021   CALCIUM 9.0 11/24/2021   PROT 6.9 11/24/2021   ALBUMIN 4.0 11/24/2021   AST 26 11/24/2021   ALT 35 11/24/2021   ALKPHOS 72 11/24/2021   BILITOT 0.6 11/24/2021   GFRNONAA >60 11/24/2021   GFRAA >60 08/21/2019    Lab Results  Component Value Date   WBC 6.1 11/24/2021   NEUTROABS 3.3 11/24/2021   HGB 13.8 11/24/2021   HCT 40.4 11/24/2021   MCV 92.0 11/24/2021   PLT 323 11/24/2021     STUDIES: No results found.  ASSESSMENT: Osteopenia, history of breast cancer.  PLAN:    Osteopenia: Patient's  most recent bone mineral density on September 29, 2021 continue to improve and reported a T score of -1.1.  Proceed with Zometa today.  It it unclear if patient requires additional treatment.  She will follow-up in 6 months for further evaluation and discussion with her primary oncologist on whether or not to continue Zometa. History of breast cancer: Stage I ER/PR positive.  Patient completed hormonal therapy several years ago.  Continue active surveillance.  Her most recent mammogram on September 29, 2021 was reported as BI-RADS 1.  Repeat in September 2024.  Patient expressed understanding and was in agreement with this plan. She also understands that She can call clinic at any time with any questions, concerns, or complaints.    Cancer Staging  No matching staging information was found for the patient.  Lloyd Huger, MD   11/24/2021 2:46 PM

## 2021-11-26 ENCOUNTER — Ambulatory Visit: Payer: Federal, State, Local not specified - PPO | Admitting: Internal Medicine

## 2021-11-26 ENCOUNTER — Ambulatory Visit: Payer: Federal, State, Local not specified - PPO

## 2021-11-26 ENCOUNTER — Other Ambulatory Visit: Payer: Federal, State, Local not specified - PPO

## 2022-05-25 ENCOUNTER — Inpatient Hospital Stay: Payer: Federal, State, Local not specified - PPO

## 2022-05-25 ENCOUNTER — Inpatient Hospital Stay (HOSPITAL_BASED_OUTPATIENT_CLINIC_OR_DEPARTMENT_OTHER): Payer: Federal, State, Local not specified - PPO | Admitting: Internal Medicine

## 2022-05-25 ENCOUNTER — Encounter: Payer: Self-pay | Admitting: Internal Medicine

## 2022-05-25 ENCOUNTER — Inpatient Hospital Stay: Payer: Federal, State, Local not specified - PPO | Attending: Oncology

## 2022-05-25 VITALS — BP 110/73 | HR 77 | Temp 97.2°F | Ht 62.0 in | Wt 143.2 lb

## 2022-05-25 DIAGNOSIS — Z87891 Personal history of nicotine dependence: Secondary | ICD-10-CM | POA: Insufficient documentation

## 2022-05-25 DIAGNOSIS — Z800 Family history of malignant neoplasm of digestive organs: Secondary | ICD-10-CM | POA: Diagnosis not present

## 2022-05-25 DIAGNOSIS — Z803 Family history of malignant neoplasm of breast: Secondary | ICD-10-CM | POA: Diagnosis not present

## 2022-05-25 DIAGNOSIS — M8580 Other specified disorders of bone density and structure, unspecified site: Secondary | ICD-10-CM | POA: Insufficient documentation

## 2022-05-25 DIAGNOSIS — Z923 Personal history of irradiation: Secondary | ICD-10-CM | POA: Insufficient documentation

## 2022-05-25 DIAGNOSIS — Z9221 Personal history of antineoplastic chemotherapy: Secondary | ICD-10-CM | POA: Insufficient documentation

## 2022-05-25 DIAGNOSIS — Z79899 Other long term (current) drug therapy: Secondary | ICD-10-CM | POA: Diagnosis not present

## 2022-05-25 DIAGNOSIS — C50812 Malignant neoplasm of overlapping sites of left female breast: Secondary | ICD-10-CM | POA: Diagnosis not present

## 2022-05-25 DIAGNOSIS — Z170 Estrogen receptor positive status [ER+]: Secondary | ICD-10-CM

## 2022-05-25 LAB — CBC WITH DIFFERENTIAL/PLATELET
Abs Immature Granulocytes: 0.01 10*3/uL (ref 0.00–0.07)
Basophils Absolute: 0 10*3/uL (ref 0.0–0.1)
Basophils Relative: 1 %
Eosinophils Absolute: 0.1 10*3/uL (ref 0.0–0.5)
Eosinophils Relative: 1 %
HCT: 41.3 % (ref 36.0–46.0)
Hemoglobin: 14.1 g/dL (ref 12.0–15.0)
Immature Granulocytes: 0 %
Lymphocytes Relative: 38 %
Lymphs Abs: 2.3 10*3/uL (ref 0.7–4.0)
MCH: 32.2 pg (ref 26.0–34.0)
MCHC: 34.1 g/dL (ref 30.0–36.0)
MCV: 94.3 fL (ref 80.0–100.0)
Monocytes Absolute: 0.5 10*3/uL (ref 0.1–1.0)
Monocytes Relative: 8 %
Neutro Abs: 3.3 10*3/uL (ref 1.7–7.7)
Neutrophils Relative %: 52 %
Platelets: 299 10*3/uL (ref 150–400)
RBC: 4.38 MIL/uL (ref 3.87–5.11)
RDW: 12.4 % (ref 11.5–15.5)
WBC: 6.2 10*3/uL (ref 4.0–10.5)
nRBC: 0 % (ref 0.0–0.2)

## 2022-05-25 LAB — COMPREHENSIVE METABOLIC PANEL
ALT: 23 U/L (ref 0–44)
AST: 20 U/L (ref 15–41)
Albumin: 4.2 g/dL (ref 3.5–5.0)
Alkaline Phosphatase: 43 U/L (ref 38–126)
Anion gap: 8 (ref 5–15)
BUN: 15 mg/dL (ref 8–23)
CO2: 25 mmol/L (ref 22–32)
Calcium: 9.3 mg/dL (ref 8.9–10.3)
Chloride: 104 mmol/L (ref 98–111)
Creatinine, Ser: 0.71 mg/dL (ref 0.44–1.00)
GFR, Estimated: >60 mL/min (ref >60)
Glucose, Bld: 87 mg/dL (ref 70–99)
Potassium: 3.6 mmol/L (ref 3.5–5.1)
Sodium: 137 mmol/L (ref 135–145)
Total Bilirubin: 0.7 mg/dL (ref 0.3–1.2)
Total Protein: 6.4 g/dL — ABNORMAL LOW (ref 6.5–8.1)

## 2022-05-25 NOTE — Progress Notes (Signed)
[facility] OFFICE PROGRESS NOTE  Patient Care Team: Lauro Regulus, MD as PCP - General (Internal Medicine) Lemar Livings, Merrily Pew, MD (General Surgery) Dear, Earlyne Iba, MD (Inactive) (Family Medicine) Earna Coder, MD as Consulting Physician (Hematology and Oncology)   Cancer Staging  No matching staging information was found for the patient.   Oncology History Overview Note  # JAN 2014- LEFT BREAST CA IDC; pT1c (1.2cm) pN0 [Stage I] ER/PR > 90%; her 2 Neu POS; AC x4- Taxol-Herceptin; Intol to AI; March 2015- Started TAM [stopped- Sep 2017]; OCT 2017- Post menopausal [estradiol/FSH]; NOV 1st 2017- Start ARIMIDEX; STOPPED [facility]- summer 2022 [? MSK]  # DEC 2016- Start Effexor   # Right oopherectomy; Left intact ovary-intact [last periods late 90s]  DIAGNOSIS:Breast cancer  STAGE:  I       ;GOALS: cure  CURRENT/MOST RECENT THERAPY: anastrazole    Malignant neoplasm of overlapping sites of left breast in female, estrogen receptor positive (HCC)   INTERVAL HISTORY:Patient ambulating-independently.  Alone.  Katrina [facility] 62 y.o.  female pleasant patient above history of stage I breast cancer ER PR positive currently on surveillance is here for follow-up/reveals of the mammogram/bone density.  C/o of lesion rt ear -last many years.  Patient has lost significant weight intentionally.  She is currently on GLP-1 agonist.  Review of Systems  Constitutional:  Positive for malaise/fatigue. Negative for chills, diaphoresis and weight loss.  HENT:  Negative for nosebleeds and sore throat.   Eyes:  Negative for double vision.  Respiratory:  Negative for hemoptysis and wheezing.   Cardiovascular:  Negative for palpitations, orthopnea and leg swelling.  Gastrointestinal:  Negative for abdominal pain, blood in stool, constipation, diarrhea, heartburn, melena, nausea and vomiting.  Genitourinary:  Negative for dysuria, frequency and urgency.  Musculoskeletal:   Positive for joint pain. Negative for back pain.  Skin: Negative.  Negative for itching and rash.  Neurological:  Negative for dizziness, tingling, focal weakness, weakness and headaches.  Endo/Heme/Allergies:  Does not bruise/bleed easily.  Psychiatric/Behavioral:  Negative for depression. The patient is not nervous/anxious and does not have insomnia.       PAST MEDICAL HISTORY :  Past Medical History:  Diagnosis Date   Anxiety    Arthritis    Asthma    Breast CA (HCC)    Breast cancer (HCC) 2014   left breast   Dyspnea    with dry cough x 7 years   Endometriosis 2001   GERD (gastroesophageal reflux disease)    Headache    h/o migraines   Heart murmur    asymptomatic   Irritable bowel syndrome    Malignant neoplasm of upper-outer quadrant of female breast (HCC) 01/30/2012   Left breast, Invasive mammary cancer, no special type: T1c,N0,M0; ER 90%, PR 90%, no amplification of HER-2/neu, aromatase inhibitors discontinued September 2015 secondary to joint symptoms, tamoxifen initiated by Dr. Sherrlyn Hock..   Personal history of chemotherapy    Personal history of radiation therapy    Pneumonia    Pulmonary hypertension (HCC) 2013   Sleep apnea 2007   does not use cpap    Sleep apnea 2007    PAST SURGICAL HISTORY :   Past Surgical History:  Procedure Laterality Date   BREAST BIOPSY Left 2013   neg,    BREAST BIOPSY Left 01/2012   invasive ductal carcinoma   BREAST BIOPSY Left 12/2012   benign, done at Dr. Rutherford Nail office, [place]   BREAST ENHANCEMENT SURGERY  2014,05/2013   BREAST LUMPECTOMY Left 2014   invasive ductal carcinoma   BREAST SURGERY Left 01/30/2012   Wide excision, sentinel node biopsy   BREAST SURGERY Left 02/09/2012   Left breast reduction/reconstruction: Caryl Asp, MD.   BREAST SURGERY Left 12/26/2012   ultrasound-guided biopsy, post surgical change.   COLONOSCOPY  2012   DILATION AND CURETTAGE OF UTERUS  2011   EXPLORATORY LAPAROTOMY  2001   FACIAL  COSMETIC SURGERY  2015   FLEXIBLE BRONCHOSCOPY N/A 09/12/2018   Procedure: BRONCHOSCOPY, BRONCHOALVEOLAR LAVAGE, SLEEP APNEA;  Surgeon: Vida Rigger, MD;  Location: [facility] ORS;  Service: Thoracic;  Laterality: N/A;   HERNIA REPAIR  1997   NISSEN FUNDOPLICATION  1997   OVARY SURGERY Right 2001   PORT A CATH REVISION  2014   REDUCTION MAMMAPLASTY Left 2014   REDUCTION MAMMAPLASTY Right 2015   RIGHT HEART CATH  2013   TONSILLECTOMY  1969   UPPER GI ENDOSCOPY  04/26/2021    FAMILY HISTORY :   Family History  Problem Relation Age of Onset   Colon cancer Paternal Grandfather    Breast cancer Maternal Aunt 50   Breast cancer Cousin 35   Breast cancer Cousin 35   Breast cancer Cousin 35    SOCIAL HISTORY:   Social History   Tobacco Use   Smoking status: Former    Packs/day: 0.10    Years: 25.00    Additional pack years: 0.00    Total pack years: 2.50    Types: Cigarettes    Quit date: 01/11/2007    Years since quitting: 15.3   Smokeless tobacco: Never   Tobacco comments:    "social smoker" per pt.  Vaping Use   Vaping Use: Never used  Substance Use Topics   Alcohol use: No   Drug use: No    ALLERGIES:  is allergic to augmentin [amoxicillin-pot clavulanate].  MEDICATIONS:  Current Outpatient Medications  Medication Sig Dispense Refill   diclofenac (VOLTAREN) 75 MG EC tablet Take 75 mg by mouth 2 (two) times daily.      pravastatin (PRAVACHOL) 10 MG tablet Take 10 mg by mouth at bedtime.     Semaglutide-Weight Management 2.4 MG/0.75ML SOAJ Inject 2.4 mg into the skin.     montelukast (SINGULAIR) 10 MG tablet TAKE ONE TABLET BY MOUTH AT BEDTIME (Patient not taking: Reported on 05/25/2022) 30 tablet 0   pantoprazole (PROTONIX) 40 MG tablet Take 40 mg by mouth every morning. (Patient not taking: Reported on 05/25/2022)     No current facility-administered medications for this visit.    PHYSICAL EXAMINATION: ECOG PERFORMANCE STATUS: 0 - Asymptomatic  BP 110/73 (BP  Location: Right Arm, Patient Position: Sitting, Cuff Size: Normal)   Pulse 77   Temp (!) 97.2 F (36.2 C) (Tympanic)   Ht 5\' 2"  (1.575 m)   Wt 143 lb 3.2 oz (65 kg)   SpO2 100%   BMI 26.19 kg/m   Filed Weights   05/25/22 1258  Weight: 143 lb 3.2 oz (65 kg)    Physical Exam HENT:     Head: Normocephalic and atraumatic.     Mouth/Throat:     Pharynx: No oropharyngeal exudate.  Eyes:     Pupils: Pupils are equal, round, and reactive to light.  Cardiovascular:     Rate and Rhythm: Normal rate and regular rhythm.  Pulmonary:     Effort: No respiratory distress.     Breath sounds: No wheezing.  Abdominal:     General:  Bowel sounds are normal. There is no distension.     Palpations: Abdomen is soft. There is no mass.     Tenderness: There is no abdominal tenderness. There is no guarding or rebound.  Musculoskeletal:        General: No tenderness. Normal range of motion.     Cervical back: Normal range of motion and neck supple.  Skin:    General: Skin is warm.     Comments:     Neurological:     Mental Status: She is alert and oriented to person, place, and time.  Psychiatric:        Mood and Affect: Affect normal.     LABORATORY DATA:  I have reviewed the data as listed    Component Value Date/Time   NA 137 05/25/2022 1302   NA 140 09/25/2013 1425   K 3.6 05/25/2022 1302   K 4.0 09/25/2013 1425   CL 104 05/25/2022 1302   CL 103 09/25/2013 1425   CO2 25 05/25/2022 1302   CO2 29 09/25/2013 1425   GLUCOSE 87 05/25/2022 1302   GLUCOSE 92 09/25/2013 1425   BUN 15 05/25/2022 1302   BUN 17 09/25/2013 1425   CREATININE 0.71 05/25/2022 1302   CREATININE 0.87 09/25/2013 1425   CALCIUM 9.3 05/25/2022 1302   CALCIUM 10.1 09/25/2013 1425   PROT 6.4 (L) 05/25/2022 1302   PROT 7.2 09/25/2013 1425   ALBUMIN 4.2 05/25/2022 1302   ALBUMIN 3.9 09/25/2013 1425   AST 20 05/25/2022 1302   AST 30 09/25/2013 1425   ALT 23 05/25/2022 1302   ALT 55 09/25/2013 1425   ALKPHOS  43 05/25/2022 1302   ALKPHOS 154 (H) 09/25/2013 1425   BILITOT 0.7 05/25/2022 1302   BILITOT 0.4 09/25/2013 1425   GFRNONAA >60 05/25/2022 1302   GFRNONAA >60 09/25/2013 1425   GFRAA >60 08/21/2019 1315   GFRAA >60 09/25/2013 1425    No results found for: "SPEP", "UPEP"  Lab Results  Component Value Date   WBC 6.2 05/25/2022   NEUTROABS 3.3 05/25/2022   HGB 14.1 05/25/2022   HCT 41.3 05/25/2022   MCV 94.3 05/25/2022   PLT 299 05/25/2022      Chemistry      Component Value Date/Time   NA 137 05/25/2022 1302   NA 140 09/25/2013 1425   K 3.6 05/25/2022 1302   K 4.0 09/25/2013 1425   CL 104 05/25/2022 1302   CL 103 09/25/2013 1425   CO2 25 05/25/2022 1302   CO2 29 09/25/2013 1425   BUN 15 05/25/2022 1302   BUN 17 09/25/2013 1425   CREATININE 0.71 05/25/2022 1302   CREATININE 0.87 09/25/2013 1425      Component Value Date/Time   CALCIUM 9.3 05/25/2022 1302   CALCIUM 10.1 09/25/2013 1425   ALKPHOS 43 05/25/2022 1302   ALKPHOS 154 (H) 09/25/2013 1425   AST 20 05/25/2022 1302   AST 30 09/25/2013 1425   ALT 23 05/25/2022 1302   ALT 55 09/25/2013 1425   BILITOT 0.7 05/25/2022 1302   BILITOT 0.4 09/25/2013 1425       RADIOGRAPHIC STUDIES: I have personally reviewed the radiological images as listed and agreed with the findings in the report. No results found.   ASSESSMENT & PLAN:  Malignant neoplasm of overlapping sites of left breast in female, estrogen receptor positive (HCC) # Stage I ER/PR positive HER-2/neu positive breast cancer; currently on surveillaince only.  SEP 2023- bil screening- WNL.   #  Bil rib pain/ low thoracic Back pain-s/p [pain clinic]; MRI spine- NEGATIVE- ? S/p epidural [Dr.Chasnis].  Defer to PCP for further management. Stable.   # Hot flashes- grade 1 Stable.   # Osteopenia-SEP 2023- The BMD  T-score of -1.1- s/p zometa q 6 M; HOLD zometa for now- given the stability of the BMD.   # Obesity:currently on wegowy- lost 70 pounds-  congratulated   # DISPOSITION: # HOLD Zometa today # SEP 2024- bil screen Mammo;-  # Follow up in 6 months-MD; labs- cbc/cmp- vit D-25-[place] - Dr.B  Cc: A   No orders of the defined types were placed in this encounter.   All questions were answered. The patient knows to call the clinic with any problems, questions or concerns.      Earna Coder, MD 05/25/2022 1:50 PM

## 2022-05-25 NOTE — Progress Notes (Signed)
C/o of lesion rt ear.

## 2022-05-25 NOTE — Assessment & Plan Note (Addendum)
#   Stage I ER/PR positive HER-2/neu positive breast cancer; currently on surveillaince only.  SEP 2023- bil screening- WNL.   # Bil rib pain/ low thoracic Back pain-s/p [pain clinic]; MRI spine- NEGATIVE- ? S/p epidural [Dr.Chasnis].  Defer to PCP for further management. Stable.   # Hot flashes- grade 1 Stable.   # Osteopenia-SEP 2023- The BMD  T-score of -1.1- s/p zometa q 6 M; HOLD zometa for now- given the stability of the BMD.   # Obesity:currently on wegowy- lost 70 pounds- congratulated   # DISPOSITION: # HOLD Zometa today # SEP 2024- bil screen Mammo;-  # Follow up in 6 months-MD; labs- cbc/cmp- vit D-25-[place] - Dr.B  Cc: A

## 2022-06-23 ENCOUNTER — Other Ambulatory Visit: Payer: Self-pay | Admitting: Physician Assistant

## 2022-06-23 ENCOUNTER — Ambulatory Visit
Admission: RE | Admit: 2022-06-23 | Discharge: 2022-06-23 | Disposition: A | Discharge status: Home / Self Care | Payer: Federal, State, Local not specified - PPO | Source: Ambulatory Visit | Attending: Physician Assistant | Admitting: Physician Assistant

## 2022-06-23 DIAGNOSIS — R109 Unspecified abdominal pain: Secondary | ICD-10-CM

## 2022-06-23 DIAGNOSIS — R112 Nausea with vomiting, unspecified: Secondary | ICD-10-CM

## 2022-06-23 MED ORDER — IOHEXOL 300 MG/ML  SOLN
100.0000 mL | Freq: Once | INTRAMUSCULAR | Status: AC | PRN
  Administered 2022-06-23: 100 mL via INTRAVENOUS

## 2022-10-05 ENCOUNTER — Ambulatory Visit
Admission: RE | Admit: 2022-10-05 | Discharge: 2022-10-05 | Disposition: A | Discharge status: Home / Self Care | Payer: Federal, State, Local not specified - PPO | Source: Ambulatory Visit | Attending: Internal Medicine | Admitting: Internal Medicine

## 2022-10-05 DIAGNOSIS — Z1231 Encounter for screening mammogram for malignant neoplasm of breast: Secondary | ICD-10-CM | POA: Insufficient documentation

## 2022-10-05 DIAGNOSIS — Z170 Estrogen receptor positive status [ER+]: Secondary | ICD-10-CM | POA: Insufficient documentation

## 2022-10-05 DIAGNOSIS — C50812 Malignant neoplasm of overlapping sites of left female breast: Secondary | ICD-10-CM | POA: Insufficient documentation

## 2022-11-30 ENCOUNTER — Inpatient Hospital Stay: Payer: Federal, State, Local not specified - PPO | Attending: Internal Medicine | Admitting: Internal Medicine

## 2022-11-30 ENCOUNTER — Inpatient Hospital Stay: Payer: Federal, State, Local not specified - PPO

## 2022-11-30 ENCOUNTER — Encounter: Payer: Self-pay | Admitting: Internal Medicine

## 2022-11-30 ENCOUNTER — Ambulatory Visit: Payer: Federal, State, Local not specified - PPO | Admitting: Internal Medicine

## 2022-11-30 ENCOUNTER — Other Ambulatory Visit: Payer: Federal, State, Local not specified - PPO

## 2022-11-30 VITALS — BP 106/70 | HR 78 | Temp 96.8°F | Ht 62.0 in | Wt 126.8 lb

## 2022-11-30 DIAGNOSIS — C50812 Malignant neoplasm of overlapping sites of left female breast: Secondary | ICD-10-CM

## 2022-11-30 DIAGNOSIS — Z79899 Other long term (current) drug therapy: Secondary | ICD-10-CM | POA: Diagnosis not present

## 2022-11-30 DIAGNOSIS — Z87891 Personal history of nicotine dependence: Secondary | ICD-10-CM | POA: Insufficient documentation

## 2022-11-30 DIAGNOSIS — Z853 Personal history of malignant neoplasm of breast: Secondary | ICD-10-CM | POA: Diagnosis present

## 2022-11-30 DIAGNOSIS — M8580 Other specified disorders of bone density and structure, unspecified site: Secondary | ICD-10-CM | POA: Diagnosis not present

## 2022-11-30 DIAGNOSIS — Z170 Estrogen receptor positive status [ER+]: Secondary | ICD-10-CM

## 2022-11-30 LAB — CBC WITH DIFFERENTIAL (CANCER CENTER ONLY)
Abs Immature Granulocytes: 0.01 10*3/uL (ref 0.00–0.07)
Basophils Absolute: 0 10*3/uL (ref 0.0–0.1)
Basophils Relative: 1 %
Eosinophils Absolute: 0.1 10*3/uL (ref 0.0–0.5)
Eosinophils Relative: 1 %
HCT: 38.4 % (ref 36.0–46.0)
Hemoglobin: 12.5 g/dL (ref 12.0–15.0)
Immature Granulocytes: 0 %
Lymphocytes Relative: 41 %
Lymphs Abs: 2 10*3/uL (ref 0.7–4.0)
MCH: 31.9 pg (ref 26.0–34.0)
MCHC: 32.6 g/dL (ref 30.0–36.0)
MCV: 98 fL (ref 80.0–100.0)
Monocytes Absolute: 0.3 10*3/uL (ref 0.1–1.0)
Monocytes Relative: 6 %
Neutro Abs: 2.5 10*3/uL (ref 1.7–7.7)
Neutrophils Relative %: 51 %
Platelet Count: 278 10*3/uL (ref 150–400)
RBC: 3.92 MIL/uL (ref 3.87–5.11)
RDW: 12.2 % (ref 11.5–15.5)
WBC Count: 4.9 10*3/uL (ref 4.0–10.5)
nRBC: 0 % (ref 0.0–0.2)

## 2022-11-30 LAB — CMP (CANCER CENTER ONLY)
ALT: 27 U/L (ref 0–44)
AST: 19 U/L (ref 15–41)
Albumin: 3.6 g/dL (ref 3.5–5.0)
Alkaline Phosphatase: 46 U/L (ref 38–126)
Anion gap: 8 (ref 5–15)
BUN: 18 mg/dL (ref 8–23)
CO2: 27 mmol/L (ref 22–32)
Calcium: 9.1 mg/dL (ref 8.9–10.3)
Chloride: 107 mmol/L (ref 98–111)
Creatinine: 0.55 mg/dL (ref 0.44–1.00)
GFR, Estimated: >60 mL/min (ref >60)
Glucose, Bld: 87 mg/dL (ref 70–99)
Potassium: 3.5 mmol/L (ref 3.5–5.1)
Sodium: 142 mmol/L (ref 135–145)
Total Bilirubin: 0.4 mg/dL (ref <1.2)
Total Protein: 6.1 g/dL — ABNORMAL LOW (ref 6.5–8.1)

## 2022-11-30 LAB — VITAMIN D 25 HYDROXY (VIT D DEFICIENCY, FRACTURES): Vit D, 25-Hydroxy: 30.03 ng/mL (ref 30–100)

## 2022-11-30 NOTE — Progress Notes (Signed)
[facility] OFFICE PROGRESS NOTE  Patient Care Team: Lauro Regulus, MD as PCP - General (Internal Medicine) Lemar Livings, Merrily Pew, MD (General Surgery) Dear, Earlyne Iba, MD (Inactive) (Family Medicine) Earna Coder, MD as Consulting Physician (Hematology and Oncology)   Cancer Staging  No matching staging information was found for the patient.    Oncology History Overview Note  # JAN 2014- LEFT BREAST CA IDC; pT1c (1.2cm) pN0 [Stage I] ER/PR > 90%; her 2 Neu POS; AC x4- Taxol-Herceptin; Intol to AI; March 2015- Started TAM [stopped- Sep 2017]; OCT 2017- Post menopausal [estradiol/FSH]; NOV 1st 2017- Start ARIMIDEX; STOPPED [facility]- summer 2022 [? MSK]  # DEC 2016- Start Effexor   # Right oopherectomy; Left intact ovary-intact [last periods late 90s]  DIAGNOSIS:Breast cancer  STAGE:  I       ;GOALS: cure    Malignant neoplasm of overlapping sites of left breast in female, estrogen receptor positive (HCC)   INTERVAL HISTORY:Patient ambulating-independently.  Alone.  Katrina [facility] 62 y.o.  female pleasant patient above history of stage I breast cancer ER PR positive currently on surveillance is here for follow-up.   C/o hot flashes x4 months . Patient has lost significant weight intentionally.  She is currently on GLP-1 agonist.  Review of Systems  Constitutional:  Positive for malaise/fatigue. Negative for chills, diaphoresis and weight loss.  HENT:  Negative for nosebleeds and sore throat.   Eyes:  Negative for double vision.  Respiratory:  Negative for hemoptysis and wheezing.   Cardiovascular:  Negative for palpitations, orthopnea and leg swelling.  Gastrointestinal:  Negative for abdominal pain, blood in stool, constipation, diarrhea, heartburn, melena, nausea and vomiting.  Genitourinary:  Negative for dysuria, frequency and urgency.  Musculoskeletal:  Positive for joint pain. Negative for back pain.  Skin: Negative.  Negative for itching and  rash.  Neurological:  Negative for dizziness, tingling, focal weakness, weakness and headaches.  Endo/Heme/Allergies:  Does not bruise/bleed easily.  Psychiatric/Behavioral:  Negative for depression. The patient is not nervous/anxious and does not have insomnia.       PAST MEDICAL HISTORY :  Past Medical History:  Diagnosis Date   Anxiety    Arthritis    Asthma    Breast CA (HCC)    Breast cancer (HCC) 2014   left breast   Dyspnea    with dry cough x 7 years   Endometriosis 2001   GERD (gastroesophageal reflux disease)    Headache    h/o migraines   Heart murmur    asymptomatic   Irritable bowel syndrome    Malignant neoplasm of upper-outer quadrant of female breast (HCC) 01/30/2012   Left breast, Invasive mammary cancer, no special type: T1c,N0,M0; ER 90%, PR 90%, no amplification of HER-2/neu, aromatase inhibitors discontinued September 2015 secondary to joint symptoms, tamoxifen initiated by Dr. Sherrlyn Hock..   Personal history of chemotherapy    Personal history of radiation therapy    Pneumonia    Pulmonary hypertension (HCC) 2013   Sleep apnea 2007   does not use cpap    Sleep apnea 2007    PAST SURGICAL HISTORY :   Past Surgical History:  Procedure Laterality Date   BREAST BIOPSY Left 2013   neg,    BREAST BIOPSY Left 01/2012   invasive ductal carcinoma   BREAST BIOPSY Left 12/2012   benign, done at Dr. Rutherford Nail office, [place]   BREAST ENHANCEMENT SURGERY  2014,05/2013   BREAST LUMPECTOMY Left 2014   invasive  ductal carcinoma   BREAST SURGERY Left 01/30/2012   Wide excision, sentinel node biopsy   BREAST SURGERY Left 02/09/2012   Left breast reduction/reconstruction: Caryl Asp, MD.   BREAST SURGERY Left 12/26/2012   ultrasound-guided biopsy, post surgical change.   COLONOSCOPY  2012   DILATION AND CURETTAGE OF UTERUS  2011   EXPLORATORY LAPAROTOMY  2001   FACIAL COSMETIC SURGERY  2015   FLEXIBLE BRONCHOSCOPY N/A 09/12/2018   Procedure: BRONCHOSCOPY,  BRONCHOALVEOLAR LAVAGE, SLEEP APNEA;  Surgeon: Vida Rigger, MD;  Location: [facility] ORS;  Service: Thoracic;  Laterality: N/A;   HERNIA REPAIR  1997   NISSEN FUNDOPLICATION  1997   OVARY SURGERY Right 2001   PORT A CATH REVISION  2014   REDUCTION MAMMAPLASTY Left 2014   REDUCTION MAMMAPLASTY Right 2015   RIGHT HEART CATH  2013   TONSILLECTOMY  1969   UPPER GI ENDOSCOPY  04/26/2021    FAMILY HISTORY :   Family History  Problem Relation Age of Onset   Colon cancer Paternal Grandfather    Breast cancer Maternal Aunt 50   Breast cancer Cousin 35   Breast cancer Cousin 35   Breast cancer Cousin 35    SOCIAL HISTORY:   Social History   Tobacco Use   Smoking status: Former    Current packs/day: 0.00    Average packs/day: 0.1 packs/day for 25.0 years (2.5 ttl pk-yrs)    Types: Cigarettes    Start date: 01/10/1982    Quit date: 01/11/2007    Years since quitting: 15.8   Smokeless tobacco: Never   Tobacco comments:    "social smoker" per pt.  Vaping Use   Vaping status: Never Used  Substance Use Topics   Alcohol use: No   Drug use: No    ALLERGIES:  is allergic to augmentin [amoxicillin-pot clavulanate].  MEDICATIONS:  Current Outpatient Medications  Medication Sig Dispense Refill   diclofenac (VOLTAREN) 75 MG EC tablet Take 75 mg by mouth 2 (two) times daily.      pravastatin (PRAVACHOL) 10 MG tablet Take 10 mg by mouth at bedtime.     Semaglutide-Weight Management 2.4 MG/0.75ML SOAJ Inject 2.4 mg into the skin.     montelukast (SINGULAIR) 10 MG tablet TAKE ONE TABLET BY MOUTH AT BEDTIME (Patient not taking: Reported on 05/25/2022) 30 tablet 0   pantoprazole (PROTONIX) 40 MG tablet Take 40 mg by mouth every morning. (Patient not taking: Reported on 05/25/2022)     No current facility-administered medications for this visit.    PHYSICAL EXAMINATION: ECOG PERFORMANCE STATUS: 0 - Asymptomatic  BP 106/70 (BP Location: Right Arm, Patient Position: Sitting, Cuff Size: Normal)    Pulse 78   Temp (!) 96.8 F (36 C) (Tympanic)   Ht 5\' 2"  (1.575 m)   Wt 126 lb 12.8 oz (57.5 kg)   SpO2 100%   BMI 23.19 kg/m   Filed Weights   11/30/22 1033  Weight: 126 lb 12.8 oz (57.5 kg)    Physical Exam HENT:     Head: Normocephalic and atraumatic.     Mouth/Throat:     Pharynx: No oropharyngeal exudate.  Eyes:     Pupils: Pupils are equal, round, and reactive to light.  Cardiovascular:     Rate and Rhythm: Normal rate and regular rhythm.  Pulmonary:     Effort: No respiratory distress.     Breath sounds: No wheezing.  Abdominal:     General: Bowel sounds are normal. There is no distension.  Palpations: Abdomen is soft. There is no mass.     Tenderness: There is no abdominal tenderness. There is no guarding or rebound.  Musculoskeletal:        General: No tenderness. Normal range of motion.     Cervical back: Normal range of motion and neck supple.  Skin:    General: Skin is warm.     Comments:     Neurological:     Mental Status: She is alert and oriented to person, place, and time.  Psychiatric:        Mood and Affect: Affect normal.     LABORATORY DATA:  I have reviewed the data as listed    Component Value Date/Time   NA 142 11/30/2022 1028   NA 140 09/25/2013 1425   K 3.5 11/30/2022 1028   K 4.0 09/25/2013 1425   CL 107 11/30/2022 1028   CL 103 09/25/2013 1425   CO2 27 11/30/2022 1028   CO2 29 09/25/2013 1425   GLUCOSE 87 11/30/2022 1028   GLUCOSE 92 09/25/2013 1425   BUN 18 11/30/2022 1028   BUN 17 09/25/2013 1425   CREATININE 0.55 11/30/2022 1028   CREATININE 0.87 09/25/2013 1425   CALCIUM 9.1 11/30/2022 1028   CALCIUM 10.1 09/25/2013 1425   PROT 6.1 (L) 11/30/2022 1028   PROT 7.2 09/25/2013 1425   ALBUMIN 3.6 11/30/2022 1028   ALBUMIN 3.9 09/25/2013 1425   AST 19 11/30/2022 1028   ALT 27 11/30/2022 1028   ALT 55 09/25/2013 1425   ALKPHOS 46 11/30/2022 1028   ALKPHOS 154 (H) 09/25/2013 1425   BILITOT 0.4 11/30/2022 1028    GFRNONAA >60 11/30/2022 1028   GFRNONAA >60 09/25/2013 1425   GFRAA >60 08/21/2019 1315   GFRAA >60 09/25/2013 1425    No results found for: "SPEP", "UPEP"  Lab Results  Component Value Date   WBC 4.9 11/30/2022   NEUTROABS 2.5 11/30/2022   HGB 12.5 11/30/2022   HCT 38.4 11/30/2022   MCV 98.0 11/30/2022   PLT 278 11/30/2022      Chemistry      Component Value Date/Time   NA 142 11/30/2022 1028   NA 140 09/25/2013 1425   K 3.5 11/30/2022 1028   K 4.0 09/25/2013 1425   CL 107 11/30/2022 1028   CL 103 09/25/2013 1425   CO2 27 11/30/2022 1028   CO2 29 09/25/2013 1425   BUN 18 11/30/2022 1028   BUN 17 09/25/2013 1425   CREATININE 0.55 11/30/2022 1028   CREATININE 0.87 09/25/2013 1425      Component Value Date/Time   CALCIUM 9.1 11/30/2022 1028   CALCIUM 10.1 09/25/2013 1425   ALKPHOS 46 11/30/2022 1028   ALKPHOS 154 (H) 09/25/2013 1425   AST 19 11/30/2022 1028   ALT 27 11/30/2022 1028   ALT 55 09/25/2013 1425   BILITOT 0.4 11/30/2022 1028       RADIOGRAPHIC STUDIES: I have personally reviewed the radiological images as listed and agreed with the findings in the report. No results found.   ASSESSMENT & PLAN:  Malignant neoplasm of overlapping sites of left breast in female, estrogen receptor positive (HCC) # Stage I ER/PR positive HER-2/neu positive breast cancer; currently on surveillaince only.  SEP 2024- bil screening- WNL.   # Bil rib pain/ low thoracic Back pain-s/p [pain clinic]; MRI spine- NEGATIVE- ? S/p epidural [Dr.Chasnis].  Defer to PCP for further management. Stable.   # Hot flashes- grade 1-2 on effexor -Stable.   #  Osteopenia-SEP 2023- The BMD  T-score of -1.1- s/p zometa q 6 M; HOLD zometa for now- given the stability of the BMD- Stable. .   # Obesity:currently on wegowy- lost 70 pounds-  stable.   #Since patient is clinically stable I think is reasonable for the patient to follow-up with PCP/can follow-up with [place] as needed.  Patient  comfortable with the plan; to call us if any questions or concerns in the interim. # sep 2025  bil screen Mammo; defer to PCP.    # DISPOSITION: # Follow up as needed- Dr.B  Cc: A    No orders of the defined types were placed in this encounter.   All questions were answered. The patient knows to call the clinic with any problems, questions or concerns.      Earna Coder, MD 11/30/2022 11:01 AM

## 2022-11-30 NOTE — Assessment & Plan Note (Addendum)
#   Stage I ER/PR positive HER-2/neu positive breast cancer; currently on surveillaince only.  SEP 2024- bil screening- WNL.   # Bil rib pain/ low thoracic Back pain-s/p [pain clinic]; MRI spine- NEGATIVE- ? S/p epidural [Dr.Chasnis].  Defer to PCP for further management. Stable.   # Hot flashes- grade 1-2 on effexor -Stable.   # Osteopenia-SEP 2023- The BMD  T-score of -1.1- s/p zometa q 6 M; HOLD zometa for now- given the stability of the BMD- Stable. .   # Obesity:currently on wegowy- lost 70 pounds-  stable.   #Since patient is clinically stable I think is reasonable for the patient to follow-up with PCP/can follow-up with [place] as needed.  Patient comfortable with the plan; to call us if any questions or concerns in the interim. # sep 2025  bil screen Mammo; defer to PCP.    # DISPOSITION: # Follow up as needed- Dr.B  Cc: A

## 2022-11-30 NOTE — Progress Notes (Signed)
C/o hot flashes x4 months.

## 2023-09-20 ENCOUNTER — Other Ambulatory Visit: Payer: Self-pay | Admitting: Internal Medicine

## 2023-09-20 DIAGNOSIS — Z1231 Encounter for screening mammogram for malignant neoplasm of breast: Secondary | ICD-10-CM

## 2023-10-18 ENCOUNTER — Ambulatory Visit
Admission: RE | Admit: 2023-10-18 | Discharge: 2023-10-18 | Disposition: A | Discharge status: Home / Self Care | Source: Ambulatory Visit | Attending: Internal Medicine | Admitting: Internal Medicine

## 2023-10-18 DIAGNOSIS — Z1231 Encounter for screening mammogram for malignant neoplasm of breast: Secondary | ICD-10-CM | POA: Diagnosis present

## 2023-10-26 ENCOUNTER — Other Ambulatory Visit: Payer: Self-pay | Admitting: Ophthalmology

## 2023-10-26 DIAGNOSIS — H53123 Transient visual loss, bilateral: Secondary | ICD-10-CM

## 2023-11-01 ENCOUNTER — Ambulatory Visit
Admission: RE | Admit: 2023-11-01 | Discharge: 2023-11-01 | Disposition: A | Discharge status: Home / Self Care | Source: Ambulatory Visit | Attending: Ophthalmology | Admitting: Ophthalmology

## 2023-11-01 DIAGNOSIS — H53123 Transient visual loss, bilateral: Secondary | ICD-10-CM | POA: Diagnosis present

## 2023-11-29 ENCOUNTER — Other Ambulatory Visit
Admission: RE | Admit: 2023-11-29 | Discharge: 2023-11-29 | Disposition: A | Discharge status: Home / Self Care | Attending: Ophthalmology | Admitting: Ophthalmology

## 2023-11-29 DIAGNOSIS — H53123 Transient visual loss, bilateral: Secondary | ICD-10-CM | POA: Diagnosis present

## 2023-11-29 LAB — COMPREHENSIVE METABOLIC PANEL WITH GFR
ALT: 31 U/L (ref 0–44)
AST: 27 U/L (ref 15–41)
Albumin: 4.5 g/dL (ref 3.5–5.0)
Alkaline Phosphatase: 57 U/L (ref 38–126)
Anion gap: 11 (ref 5–15)
BUN: 18 mg/dL (ref 8–23)
CO2: 27 mmol/L (ref 22–32)
Calcium: 9.9 mg/dL (ref 8.9–10.3)
Chloride: 100 mmol/L (ref 98–111)
Creatinine, Ser: 0.63 mg/dL (ref 0.44–1.00)
GFR, Estimated: >60 mL/min (ref >60)
Glucose, Bld: 90 mg/dL (ref 70–99)
Potassium: 3.9 mmol/L (ref 3.5–5.1)
Sodium: 138 mmol/L (ref 135–145)
Total Bilirubin: 0.6 mg/dL (ref 0.0–1.2)
Total Protein: 6.6 g/dL (ref 6.5–8.1)

## 2023-11-29 LAB — CBC WITH DIFFERENTIAL/PLATELET
Abs Immature Granulocytes: 0.03 K/uL (ref 0.00–0.07)
Basophils Absolute: 0 K/uL (ref 0.0–0.1)
Basophils Relative: 0 %
Eosinophils Absolute: 0.1 K/uL (ref 0.0–0.5)
Eosinophils Relative: 1 %
HCT: 39.2 % (ref 36.0–46.0)
Hemoglobin: 13.6 g/dL (ref 12.0–15.0)
Immature Granulocytes: 0 %
Lymphocytes Relative: 21 %
Lymphs Abs: 2.3 K/uL (ref 0.7–4.0)
MCH: 32.6 pg (ref 26.0–34.0)
MCHC: 34.7 g/dL (ref 30.0–36.0)
MCV: 94 fL (ref 80.0–100.0)
Monocytes Absolute: 0.5 K/uL (ref 0.1–1.0)
Monocytes Relative: 4 %
Neutro Abs: 8.1 K/uL — ABNORMAL HIGH (ref 1.7–7.7)
Neutrophils Relative %: 74 %
Platelets: 287 K/uL (ref 150–400)
RBC: 4.17 MIL/uL (ref 3.87–5.11)
RDW: 11.4 % — ABNORMAL LOW (ref 11.5–15.5)
WBC: 11 K/uL — ABNORMAL HIGH (ref 4.0–10.5)
nRBC: 0 % (ref 0.0–0.2)

## 2023-11-29 LAB — HEMOGLOBIN A1C
Hgb A1c MFr Bld: 4.7 % — ABNORMAL LOW (ref 4.8–5.6)
Mean Plasma Glucose: 88.19 mg/dL

## 2023-11-29 LAB — C-REACTIVE PROTEIN: CRP: 0.5 mg/dL (ref <1.0)

## 2023-11-29 LAB — SEDIMENTATION RATE: Sed Rate: 4 mm/h (ref 0–30)

## 2023-11-29 LAB — CHOLESTEROL, TOTAL: Cholesterol: 190 mg/dL (ref 0–200)

## 2023-11-30 ENCOUNTER — Other Ambulatory Visit: Payer: Self-pay | Admitting: Ophthalmology

## 2023-11-30 DIAGNOSIS — H53123 Transient visual loss, bilateral: Secondary | ICD-10-CM

## 2023-12-06 ENCOUNTER — Ambulatory Visit
Admission: RE | Admit: 2023-12-06 | Discharge: 2023-12-06 | Disposition: A | Discharge status: Home / Self Care | Source: Ambulatory Visit | Attending: Ophthalmology | Admitting: Ophthalmology

## 2023-12-06 DIAGNOSIS — H53123 Transient visual loss, bilateral: Secondary | ICD-10-CM | POA: Diagnosis present

## 2023-12-06 MED ORDER — GADOBUTROL 1 MMOL/ML IV SOLN
6.0000 mL | Freq: Once | INTRAVENOUS | Status: AC | PRN
  Administered 2023-12-06: 6 mL via INTRAVENOUS

## 2024-02-06 ENCOUNTER — Ambulatory Visit
Admission: RE | Admit: 2024-02-06 | Discharge: 2024-02-06 | Disposition: A | Discharge status: Home / Self Care | Source: Ambulatory Visit | Attending: Internal Medicine | Admitting: Internal Medicine

## 2024-02-06 ENCOUNTER — Encounter: Payer: Self-pay | Admitting: Internal Medicine

## 2024-02-06 ENCOUNTER — Other Ambulatory Visit: Payer: Self-pay | Admitting: Internal Medicine

## 2024-02-06 DIAGNOSIS — H539 Unspecified visual disturbance: Secondary | ICD-10-CM

## 2024-02-06 DIAGNOSIS — G459 Transient cerebral ischemic attack, unspecified: Secondary | ICD-10-CM | POA: Diagnosis present

## 2024-02-06 MED ORDER — GADOBUTROL 1 MMOL/ML IV SOLN
6.0000 mL | Freq: Once | INTRAVENOUS | Status: AC | PRN
  Administered 2024-02-06: 6 mL via INTRAVENOUS
